# Patient Record
Sex: Female | Born: 1945
Health system: Southern US, Community
[De-identification: ages and names within clinical notes are randomized; demographics above are authoritative.]

## PROBLEM LIST (undated history)

## (undated) DIAGNOSIS — E785 Hyperlipidemia, unspecified: Secondary | ICD-10-CM

## (undated) DIAGNOSIS — Z780 Asymptomatic menopausal state: Secondary | ICD-10-CM

## (undated) DIAGNOSIS — M1990 Unspecified osteoarthritis, unspecified site: Secondary | ICD-10-CM

## (undated) DIAGNOSIS — K589 Irritable bowel syndrome without diarrhea: Secondary | ICD-10-CM

## (undated) DIAGNOSIS — I4819 Other persistent atrial fibrillation: Secondary | ICD-10-CM

## (undated) DIAGNOSIS — R1900 Intra-abdominal and pelvic swelling, mass and lump, unspecified site: Secondary | ICD-10-CM

## (undated) DIAGNOSIS — Z87891 Personal history of nicotine dependence: Secondary | ICD-10-CM

## (undated) DIAGNOSIS — E669 Obesity, unspecified: Secondary | ICD-10-CM

## (undated) DIAGNOSIS — I10 Essential (primary) hypertension: Secondary | ICD-10-CM

## (undated) DIAGNOSIS — I4510 Unspecified right bundle-branch block: Secondary | ICD-10-CM

## (undated) HISTORY — DX: Essential (primary) hypertension: I10

## (undated) HISTORY — PX: CARPAL TUNNEL RELEASE: SHX101

## (undated) HISTORY — DX: Hyperlipidemia, unspecified: E78.5

## (undated) HISTORY — DX: Unspecified right bundle-branch block: I45.10

## (undated) HISTORY — DX: Unspecified osteoarthritis, unspecified site: M19.90

## (undated) HISTORY — DX: Intra-abdominal and pelvic swelling, mass and lump, unspecified site: R19.00

## (undated) HISTORY — DX: Asymptomatic menopausal state: Z78.0

## (undated) HISTORY — DX: Personal history of nicotine dependence: Z87.891

## (undated) HISTORY — DX: Irritable bowel syndrome, unspecified: K58.9

## (undated) HISTORY — PX: TONSILLECTOMY AND ADENOIDECTOMY: SUR1326

## (undated) HISTORY — DX: Other persistent atrial fibrillation: I48.19

## (undated) HISTORY — DX: Obesity, unspecified: E66.9

## (undated) HISTORY — PX: BREAST BIOPSY: SHX20

## (undated) HISTORY — PX: BREAST REDUCTION SURGERY: SHX8

---

## 1976-12-16 HISTORY — PX: OVARIAN CYST REMOVAL: SHX89

## 1998-04-27 ENCOUNTER — Other Ambulatory Visit: Admission: RE | Admit: 1998-04-27 | Discharge: 1998-04-27 | Payer: Self-pay | Admitting: Gastroenterology

## 1998-10-25 ENCOUNTER — Other Ambulatory Visit: Admission: RE | Admit: 1998-10-25 | Discharge: 1998-10-25 | Payer: Self-pay | Admitting: Obstetrics and Gynecology

## 1999-05-23 ENCOUNTER — Ambulatory Visit (HOSPITAL_COMMUNITY): Admission: RE | Admit: 1999-05-23 | Discharge: 1999-05-23 | Payer: Self-pay | Admitting: Gastroenterology

## 2000-01-11 ENCOUNTER — Encounter: Admission: RE | Admit: 2000-01-11 | Discharge: 2000-01-11 | Payer: Self-pay | Admitting: *Deleted

## 2000-01-11 ENCOUNTER — Encounter: Payer: Self-pay | Admitting: *Deleted

## 2000-01-16 ENCOUNTER — Ambulatory Visit (HOSPITAL_COMMUNITY): Admission: RE | Admit: 2000-01-16 | Discharge: 2000-01-16 | Payer: Self-pay | Admitting: Obstetrics and Gynecology

## 2000-01-16 ENCOUNTER — Encounter: Payer: Self-pay | Admitting: Obstetrics and Gynecology

## 2000-12-26 ENCOUNTER — Encounter: Payer: Self-pay | Admitting: Obstetrics and Gynecology

## 2000-12-26 ENCOUNTER — Encounter: Admission: RE | Admit: 2000-12-26 | Discharge: 2000-12-26 | Payer: Self-pay | Admitting: Obstetrics and Gynecology

## 2001-01-12 ENCOUNTER — Encounter: Payer: Self-pay | Admitting: *Deleted

## 2001-01-12 ENCOUNTER — Encounter: Admission: RE | Admit: 2001-01-12 | Discharge: 2001-01-12 | Payer: Self-pay | Admitting: *Deleted

## 2001-05-18 ENCOUNTER — Encounter (INDEPENDENT_AMBULATORY_CARE_PROVIDER_SITE_OTHER): Payer: Self-pay

## 2001-05-18 ENCOUNTER — Ambulatory Visit (HOSPITAL_COMMUNITY): Admission: RE | Admit: 2001-05-18 | Discharge: 2001-05-18 | Payer: Self-pay | Admitting: Gastroenterology

## 2001-07-21 ENCOUNTER — Encounter: Payer: Self-pay | Admitting: Obstetrics and Gynecology

## 2001-07-21 ENCOUNTER — Ambulatory Visit (HOSPITAL_COMMUNITY): Admission: RE | Admit: 2001-07-21 | Discharge: 2001-07-21 | Payer: Self-pay | Admitting: Obstetrics and Gynecology

## 2001-09-14 ENCOUNTER — Ambulatory Visit (HOSPITAL_COMMUNITY): Admission: RE | Admit: 2001-09-14 | Discharge: 2001-09-14 | Payer: Self-pay | Admitting: Internal Medicine

## 2002-01-11 ENCOUNTER — Encounter: Payer: Self-pay | Admitting: *Deleted

## 2002-01-11 ENCOUNTER — Encounter: Admission: RE | Admit: 2002-01-11 | Discharge: 2002-01-11 | Payer: Self-pay | Admitting: *Deleted

## 2002-08-27 HISTORY — PX: CARDIOVASCULAR STRESS TEST: SHX262

## 2003-01-11 ENCOUNTER — Encounter: Admission: RE | Admit: 2003-01-11 | Discharge: 2003-01-11 | Payer: Self-pay | Admitting: *Deleted

## 2003-01-11 ENCOUNTER — Encounter: Payer: Self-pay | Admitting: *Deleted

## 2003-07-06 ENCOUNTER — Ambulatory Visit (HOSPITAL_BASED_OUTPATIENT_CLINIC_OR_DEPARTMENT_OTHER): Admission: RE | Admit: 2003-07-06 | Discharge: 2003-07-06 | Payer: Self-pay | Admitting: *Deleted

## 2004-01-16 ENCOUNTER — Encounter: Admission: RE | Admit: 2004-01-16 | Discharge: 2004-01-16 | Payer: Self-pay | Admitting: Surgery

## 2004-06-25 ENCOUNTER — Encounter: Admission: RE | Admit: 2004-06-25 | Discharge: 2004-06-25 | Payer: Self-pay | Admitting: Internal Medicine

## 2004-11-21 ENCOUNTER — Encounter: Admission: RE | Admit: 2004-11-21 | Discharge: 2004-11-21 | Payer: Self-pay | Admitting: Surgery

## 2005-05-07 ENCOUNTER — Ambulatory Visit (HOSPITAL_COMMUNITY): Admission: RE | Admit: 2005-05-07 | Discharge: 2005-05-07 | Payer: Self-pay | Admitting: Obstetrics and Gynecology

## 2005-07-23 ENCOUNTER — Encounter: Admission: RE | Admit: 2005-07-23 | Discharge: 2005-07-23 | Payer: Self-pay | Admitting: Otolaryngology

## 2005-08-20 ENCOUNTER — Encounter: Admission: RE | Admit: 2005-08-20 | Discharge: 2005-08-20 | Payer: Self-pay | Admitting: Surgery

## 2005-09-06 ENCOUNTER — Encounter: Admission: RE | Admit: 2005-09-06 | Discharge: 2005-09-06 | Payer: Self-pay | Admitting: Surgery

## 2005-11-16 ENCOUNTER — Encounter: Admission: RE | Admit: 2005-11-16 | Discharge: 2005-11-16 | Payer: Self-pay | Admitting: Internal Medicine

## 2006-06-27 ENCOUNTER — Encounter (INDEPENDENT_AMBULATORY_CARE_PROVIDER_SITE_OTHER): Payer: Self-pay | Admitting: *Deleted

## 2006-06-27 ENCOUNTER — Ambulatory Visit (HOSPITAL_BASED_OUTPATIENT_CLINIC_OR_DEPARTMENT_OTHER): Admission: RE | Admit: 2006-06-27 | Discharge: 2006-06-27 | Payer: Self-pay | Admitting: Surgery

## 2006-09-10 ENCOUNTER — Encounter: Admission: RE | Admit: 2006-09-10 | Discharge: 2006-09-10 | Payer: Self-pay | Admitting: Surgery

## 2006-12-23 HISTORY — PX: US ECHOCARDIOGRAPHY: HXRAD669

## 2007-04-27 ENCOUNTER — Encounter: Admission: RE | Admit: 2007-04-27 | Discharge: 2007-04-27 | Payer: Self-pay | Admitting: Internal Medicine

## 2007-09-14 ENCOUNTER — Encounter: Admission: RE | Admit: 2007-09-14 | Discharge: 2007-09-14 | Payer: Self-pay | Admitting: Surgery

## 2008-01-26 ENCOUNTER — Ambulatory Visit (HOSPITAL_BASED_OUTPATIENT_CLINIC_OR_DEPARTMENT_OTHER): Admission: RE | Admit: 2008-01-26 | Discharge: 2008-01-26 | Payer: Self-pay | Admitting: Urology

## 2008-09-20 ENCOUNTER — Encounter: Admission: RE | Admit: 2008-09-20 | Discharge: 2008-09-20 | Payer: Self-pay | Admitting: Surgery

## 2009-09-25 ENCOUNTER — Encounter: Admission: RE | Admit: 2009-09-25 | Discharge: 2009-09-25 | Payer: Self-pay | Admitting: Surgery

## 2010-09-25 ENCOUNTER — Encounter: Admission: RE | Admit: 2010-09-25 | Discharge: 2010-09-25 | Payer: Self-pay | Admitting: Surgery

## 2010-10-24 ENCOUNTER — Ambulatory Visit: Payer: Self-pay | Admitting: Cardiovascular Disease

## 2011-01-29 ENCOUNTER — Ambulatory Visit (INDEPENDENT_AMBULATORY_CARE_PROVIDER_SITE_OTHER): Payer: Medicare Other | Admitting: *Deleted

## 2011-01-29 DIAGNOSIS — R002 Palpitations: Secondary | ICD-10-CM

## 2011-01-29 DIAGNOSIS — I10 Essential (primary) hypertension: Secondary | ICD-10-CM

## 2011-02-13 ENCOUNTER — Ambulatory Visit: Payer: No Typology Code available for payment source | Admitting: *Deleted

## 2011-02-15 ENCOUNTER — Inpatient Hospital Stay (HOSPITAL_COMMUNITY)
Admission: EM | Admit: 2011-02-15 | Discharge: 2011-02-18 | DRG: 281 | Disposition: A | Discharge status: Home / Self Care | Payer: Medicare Other | Attending: Cardiovascular Disease | Admitting: Cardiovascular Disease

## 2011-02-15 ENCOUNTER — Emergency Department (HOSPITAL_COMMUNITY): Payer: Medicare Other

## 2011-02-15 ENCOUNTER — Ambulatory Visit: Payer: No Typology Code available for payment source | Admitting: Nurse Practitioner

## 2011-02-15 DIAGNOSIS — I4510 Unspecified right bundle-branch block: Secondary | ICD-10-CM | POA: Diagnosis present

## 2011-02-15 DIAGNOSIS — E876 Hypokalemia: Secondary | ICD-10-CM | POA: Diagnosis present

## 2011-02-15 DIAGNOSIS — E871 Hypo-osmolality and hyponatremia: Secondary | ICD-10-CM | POA: Diagnosis present

## 2011-02-15 DIAGNOSIS — R079 Chest pain, unspecified: Secondary | ICD-10-CM

## 2011-02-15 DIAGNOSIS — K219 Gastro-esophageal reflux disease without esophagitis: Secondary | ICD-10-CM | POA: Diagnosis present

## 2011-02-15 DIAGNOSIS — I10 Essential (primary) hypertension: Secondary | ICD-10-CM | POA: Diagnosis present

## 2011-02-15 DIAGNOSIS — I214 Non-ST elevation (NSTEMI) myocardial infarction: Principal | ICD-10-CM | POA: Diagnosis present

## 2011-02-15 LAB — RAPID URINE DRUG SCREEN, HOSP PERFORMED
Amphetamines: NOT DETECTED
Barbiturates: NOT DETECTED
Benzodiazepines: NOT DETECTED
Opiates: NOT DETECTED
Tetrahydrocannabinol: NOT DETECTED

## 2011-02-15 LAB — CBC
HCT: 38.9 % (ref 36.0–46.0)
Hemoglobin: 13.8 g/dL (ref 12.0–15.0)
MCHC: 35.5 g/dL (ref 30.0–36.0)
MCV: 94.4 fL (ref 78.0–100.0)
Platelets: 287 10*3/uL (ref 150–400)
RBC: 4.12 MIL/uL (ref 3.87–5.11)
RDW: 12.8 % (ref 11.5–15.5)
WBC: 7.1 10*3/uL (ref 4.0–10.5)

## 2011-02-15 LAB — DIFFERENTIAL
Basophils Absolute: 0 10*3/uL (ref 0.0–0.1)
Basophils Relative: 0 % (ref 0–1)
Eosinophils Relative: 1 % (ref 0–5)
Lymphocytes Relative: 10 % — ABNORMAL LOW (ref 12–46)
Monocytes Absolute: 0.5 10*3/uL (ref 0.1–1.0)
Monocytes Relative: 7 % (ref 3–12)
Neutro Abs: 5.8 10*3/uL (ref 1.7–7.7)
Neutrophils Relative %: 81 % — ABNORMAL HIGH (ref 43–77)

## 2011-02-15 LAB — BASIC METABOLIC PANEL
BUN: 12 mg/dL (ref 6–23)
CO2: 26 mEq/L (ref 19–32)
Calcium: 9.7 mg/dL (ref 8.4–10.5)
Creatinine, Ser: 0.65 mg/dL (ref 0.4–1.2)
GFR calc Af Amer: >60 mL/min (ref >60)
GFR calc non Af Amer: >60 mL/min (ref >60)
Glucose, Bld: 134 mg/dL — ABNORMAL HIGH (ref 70–99)
Potassium: 3.9 mEq/L (ref 3.5–5.1)
Sodium: 131 mEq/L — ABNORMAL LOW (ref 135–145)

## 2011-02-15 LAB — COMPREHENSIVE METABOLIC PANEL
ALT: 20 U/L (ref 0–35)
Albumin: 4.4 g/dL (ref 3.5–5.2)
BUN: 11 mg/dL (ref 6–23)
CO2: 25 mEq/L (ref 19–32)
Calcium: 9.6 mg/dL (ref 8.4–10.5)
Chloride: 91 mEq/L — ABNORMAL LOW (ref 96–112)
Creatinine, Ser: 0.71 mg/dL (ref 0.4–1.2)
GFR calc Af Amer: >60 mL/min (ref >60)
GFR calc non Af Amer: >60 mL/min (ref >60)
Glucose, Bld: 133 mg/dL — ABNORMAL HIGH (ref 70–99)
Potassium: 3.6 mEq/L (ref 3.5–5.1)
Total Bilirubin: 0.5 mg/dL (ref 0.3–1.2)
Total Protein: 7.5 g/dL (ref 6.0–8.3)

## 2011-02-15 LAB — CK TOTAL AND CKMB (NOT AT ARMC)
Relative Index: 3 — ABNORMAL HIGH (ref 0.0–2.5)
Total CK: 108 U/L (ref 7–177)

## 2011-02-15 LAB — POCT CARDIAC MARKERS
CKMB, poc: <1 ng/mL — ABNORMAL LOW (ref 1.0–8.0)
Myoglobin, poc: 54 ng/mL (ref 12–200)
Troponin i, poc: <0.05 ng/mL (ref 0.00–0.09)

## 2011-02-15 LAB — TROPONIN I: Troponin I: 0.15 ng/mL — ABNORMAL HIGH (ref 0.00–0.06)

## 2011-02-16 DIAGNOSIS — I517 Cardiomegaly: Secondary | ICD-10-CM

## 2011-02-16 LAB — BASIC METABOLIC PANEL
BUN: 12 mg/dL (ref 6–23)
Calcium: 9.3 mg/dL (ref 8.4–10.5)
Creatinine, Ser: 0.65 mg/dL (ref 0.4–1.2)
GFR calc non Af Amer: >60 mL/min (ref >60)
Glucose, Bld: 119 mg/dL — ABNORMAL HIGH (ref 70–99)
Potassium: 3.3 mEq/L — ABNORMAL LOW (ref 3.5–5.1)

## 2011-02-16 LAB — CARDIAC PANEL(CRET KIN+CKTOT+MB+TROPI)
CK, MB: 2.5 ng/mL (ref 0.3–4.0)
CK, MB: 2.3 ng/mL (ref 0.3–4.0)
Relative Index: INVALID (ref 0.0–2.5)
Total CK: 50 U/L (ref 7–177)
Troponin I: 0.25 ng/mL — ABNORMAL HIGH (ref 0.00–0.06)

## 2011-02-16 LAB — CBC
HCT: 35.5 % — ABNORMAL LOW (ref 36.0–46.0)
MCH: 33.2 pg (ref 26.0–34.0)
MCHC: 35.5 g/dL (ref 30.0–36.0)
MCV: 93.7 fL (ref 78.0–100.0)
Platelets: 249 10*3/uL (ref 150–400)
RDW: 12.8 % (ref 11.5–15.5)
WBC: 6.1 10*3/uL (ref 4.0–10.5)

## 2011-02-16 NOTE — H&P (Signed)
Amber, [name]              ACCOUNT NO.:  [id number]  MEDICAL RECORD NO.:  [id number]           PATIENT TYPE:  I  LOCATION:  2023                         FACILITY:  [facility]  PHYSICIAN:  Zacarias Pontes, MD       DATE OF BIRTH:  [birth year]  DATE OF ADMISSION:  02/15/2011 DATE OF DISCHARGE:                             HISTORY & PHYSICAL   LOCATION:  Room 2023.  PRIMARY CARDIOLOGIST:  Vesta Mixer, MD  PRIMARY CARE PHYSICIAN:  Gwen Pounds, MD  CHIEF COMPLAINT:  Chest pain.  HISTORY OF PRESENT ILLNESS:  [name] is a very pleasant 65 year old woman with a history of hypertension, hyperlipidemia, gastroesophageal reflux disease, and paroxysms of tachycardia managed medically who today experienced a pronounced episode of palpitations accompanied by diaphoresis lasting 20-30 minutes.  She was playing cards when it occurred.  She describes it as her hear beating rapidly and "doing somersaults."   In response to her symptoms, she took a combined total of three 10 mg doses of propranolol with eventual resolution of her symptoms.  Although her 30-minute episode earlier in the day was accompanied by diaphoresis, it was not accompanied by nausea or vomiting.  It was also unaccompanied by radiation to the jaw, back, or arms.  It is worth noting that she had no preceding symptoms prior to the abrupt onset of her symptoms.  She has been feeling well over the last few days and exercised routinely this morning without any significant difficulty.  Based on the dramatic nature and duration of her palpitations today, she presents for further evaluation and management.  PAST MEDICAL HISTORY: 1. Hypertension. 2. Hyperlipidemia. 3. Irregular heartbeat for which she was on Toprol-XL and p.r.n.     propranolol. 4. History of tonsillectomy. 5. History of breast reduction bilaterally. 6. History of carpal tunnel release.  SOCIAL HISTORY:  She drinks occasional alcohol.  Lives with her  husband. Denies any tobacco or illicit use.  She used to smoke socially when she was rather young.  She is an anxious airline passenger and typically premedicates with 0.5 mg of alprazolam.  FAMILY HISTORY:  Family history is not felt to be contributory.  REVIEW OF SYSTEMS:  As per HPI, otherwise is comprehensively negative.  ALLERGIES:  CIPROFLOXACIN.  MEDICATIONS: 1. Amlodipine 2.5 mg daily. 2. Losartan 100 mg daily. 3. HCTZ 25 mg daily. 4. Metoprolol XL 100 mg in the morning, 50 mg in the evening. 5. Rosuvastatin 5 mg daily. 6. Propranolol 10 mg to use on a p.r.n. basis. 7. Aspirin 81 mg daily. 8. Vitamin D3 and fish oil.  PHYSICAL EXAMINATION:  A comprehensive cardiovascular exam was performed.  The patient is afebrile with a heart rate of approximately 70 with blood pressure of 161/90 in the emergency room.  She is breathing comfortably with respiratory rate of 16 times per minute with oxygen saturations of 97% on 2 liters nasal cannula.  HEENT exam is notable for a neck that is supple with no masses or lymphadenopathy.  No carotid bruits are appreciated.  JVP is assessed as normal.  Cardiac exam was notable for normal S1, S2 with  no murmurs, rubs, or gallops. PMI is nondisplaced.  Pulmonary exam was notable for lungs that are clear to auscultation bilaterally with no crackles or wheezes appreciated.  Abdominal exam is notable for a soft, nontender, nondistended abdomen with positive bowel sounds and no abdominal bruits. Extremities are warm and well-perfused with no appreciable lower extremity edema.  Neurologic exam is notable for the patient who is alert and oriented x3.  No focal neurologic deficit detected at this time.  LABORATORY EVALUATION:  White blood cell count of 7000, hematocrit 38, platelets 287,000.  Sodium 128, potassium 3.6, chloride 91, bicarbonate 25 thus yielding a normal anion gap of 12.  BUN 11, creatinine 0.7. Random glucose is elevated at 133.   Initial troponin was less than 0.5, subsequently rose to 0.15 and more recently 0.25.  An ECG demonstrates normal sinus rhythm at approximately 80 beats per minute.  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 There is no ST-segment deviation.  Note the patient has neither prior caths nor prior 2-D echos in our system.  She does, however, report a negative stress test years ago. That result is not available for current review.  IMPRESSION:  This is a 65 year old woman with a symptomatic paroxysm of palpitations lasting 20-30 minutes associated with mild troponin elevation.   Her ECG does not suggest ongoing ischemia, and I suspect this was demand driven rather than an unstable coronary syndrome.  Whatever her SVT or generally symptomatic tachycardia syndrome, it represented enough of a stress to cause her to leak some enzyme when it is sustained for more than a brief period, as was the case today.  PLAN:  We will admit her to telemetry and return to cardiac markers while on intravenous heparin.  We will also continue medical management of her heart rate in the form of beta blockade both standing and p.r.n. I think a transthoracic echocardiogram would be of utility to assess wall motion and wall thickness.  We will also continue to work to control her blood pressure as this could potentially be a contributor to her propensity towards arrhythmias.  Her sodium and chloride both appear to be a bit low.  We will of course recheck these to either confirm or deny this fact.  In the mean time, I am holding her HCTZ, as it could be a culprit for her electrolyte abnormalities despite its presence on her medication list for several years now.  Ultimately the most useful intervention may prove to be a loop event recorder so that cardiology and EP can carefully assess the nature of her rhythm when she is symptomatic.  I am reluctant to conclude that her presentation  constitutes the equivalent of a positive stress test, particularly given her ability to regularly exercise vigorously without limiting symptoms. A formal stress test may be in order to better characterize  the likelihood of potentially significant coronary disease.         ______________________________ Zacarias Pontes, MD     DM/MEDQ  D:  02/16/2011  T:  02/16/2011  Job:  [id number]  Electronically Signed by Zacarias Pontes MD on 02/16/2011 08:58:10 AM

## 2011-02-17 LAB — BASIC METABOLIC PANEL
BUN: 8 mg/dL (ref 6–23)
Calcium: 9.2 mg/dL (ref 8.4–10.5)
GFR calc non Af Amer: >60 mL/min (ref >60)
Glucose, Bld: 98 mg/dL (ref 70–99)
Potassium: 4 mEq/L (ref 3.5–5.1)
Sodium: 138 mEq/L (ref 135–145)

## 2011-02-17 LAB — HEPARIN LEVEL (UNFRACTIONATED)
Heparin Unfractionated: 0.24 IU/mL — ABNORMAL LOW (ref 0.30–0.70)
Heparin Unfractionated: 0.57 IU/mL (ref 0.30–0.70)

## 2011-02-17 LAB — CBC
HCT: 38.2 % (ref 36.0–46.0)
Hemoglobin: 12.9 g/dL (ref 12.0–15.0)
MCH: 32.7 pg (ref 26.0–34.0)
MCHC: 33.8 g/dL (ref 30.0–36.0)
MCV: 96.7 fL (ref 78.0–100.0)
Platelets: 220 10*3/uL (ref 150–400)
RBC: 3.95 MIL/uL (ref 3.87–5.11)
RDW: 13.2 % (ref 11.5–15.5)
WBC: 3.9 10*3/uL — ABNORMAL LOW (ref 4.0–10.5)

## 2011-02-18 ENCOUNTER — Inpatient Hospital Stay (HOSPITAL_COMMUNITY): Payer: Medicare Other

## 2011-02-18 DIAGNOSIS — R079 Chest pain, unspecified: Secondary | ICD-10-CM

## 2011-02-18 DIAGNOSIS — I214 Non-ST elevation (NSTEMI) myocardial infarction: Secondary | ICD-10-CM

## 2011-02-18 LAB — CBC
MCH: 32.6 pg (ref 26.0–34.0)
MCV: 96.8 fL (ref 78.0–100.0)
Platelets: 228 10*3/uL (ref 150–400)
RDW: 13 % (ref 11.5–15.5)
WBC: 4.5 10*3/uL (ref 4.0–10.5)

## 2011-02-18 LAB — HEPARIN LEVEL (UNFRACTIONATED): Heparin Unfractionated: 0.27 IU/mL — ABNORMAL LOW (ref 0.30–0.70)

## 2011-02-18 MED ORDER — TECHNETIUM TC 99M TETROFOSMIN IV KIT
10.0000 | PACK | Freq: Once | INTRAVENOUS | Status: AC | PRN
  Administered 2011-02-18: 10 via INTRAVENOUS

## 2011-02-18 MED ORDER — TECHNETIUM TC 99M TETROFOSMIN IV KIT
30.0000 | PACK | Freq: Once | INTRAVENOUS | Status: AC | PRN
  Administered 2011-02-18: 30 via INTRAVENOUS

## 2011-02-19 ENCOUNTER — Ambulatory Visit: Payer: No Typology Code available for payment source | Admitting: Nurse Practitioner

## 2011-02-19 ENCOUNTER — Inpatient Hospital Stay (HOSPITAL_COMMUNITY): Payer: No Typology Code available for payment source

## 2011-02-22 ENCOUNTER — Ambulatory Visit (INDEPENDENT_AMBULATORY_CARE_PROVIDER_SITE_OTHER): Payer: Medicare Other | Admitting: Nurse Practitioner

## 2011-02-22 DIAGNOSIS — R002 Palpitations: Secondary | ICD-10-CM

## 2011-02-22 DIAGNOSIS — R079 Chest pain, unspecified: Secondary | ICD-10-CM

## 2011-02-22 DIAGNOSIS — I10 Essential (primary) hypertension: Secondary | ICD-10-CM

## 2011-02-22 DIAGNOSIS — R0602 Shortness of breath: Secondary | ICD-10-CM

## 2011-02-22 NOTE — Discharge Summary (Signed)
[name], [name]              ACCOUNT NO.:  [id number]  MEDICAL RECORD NO.:  [id number]           PATIENT TYPE:  I  LOCATION:  2023                         FACILITY:  [facility]  PHYSICIAN:  Pricilla Riffle, MD, FACCDATE OF BIRTH:  12-Feb-1946  DATE OF ADMISSION:  02/15/2011 DATE OF DISCHARGE:  02/18/2011                              DISCHARGE SUMMARY   PRIMARY CARDIOLOGIST:  Vesta Mixer, MD  PRIMARY CARE PHYSICIAN:  Amber Pounds, MD  DISCHARGE DIAGNOSES: 1. Type 2 non-ST segment elevation myocardial infarction.     a.     Peak troponin 0.25 on second full set of enzymes,      downtrending on third set.     b.     Complete negative treadmill Myoview (no ischemia, LVEF 73%). 2. Symptomatic palpitations (no ominous etiology on telemetry).     a.     Follow up with primary cardiologist and EP, likely loop      recorder.     b.     We will continue scheduled and p.r.n. beta-blockade therapy      as per preadmission med list. 3. Hyponatremia.     a.     Sodium nadir of 128, resolved by the date of discharge, HCTZ      discontinued. 4. Hypokalemia.     a.     Potassium nadir of 3.3, resolved by the date of discharge. 5. Hypertension.     a.     Systolic blood pressure ranging from 93-150, off HCTZ,      Norvasc increased to 5 mg p.o. daily.  SECONDARY DIAGNOSES: 1. Hyperlipidemia. 2. S/P tonsillectomy. 3. S/P breast reduction bilaterally. 4. S/P carpal tunnel release. 5. Gastroesophageal reflux disease.  ALLERGIES:  CIPROFLOXACIN (rash).  PROCEDURES: 1. EKG, February 16, 2011:  NSR, 80 bpm, incomplete right bundle-branch     block (old), no significant ST-T-wave changes. 2. Chest x-ray, February 15, 2011:  No acute abnormalities. 3. A 2D echocardiogram, February 16, 2011:  LV cavity size normal, mild     concentric hypertrophy, LVEF 60-65% with normal wall motion.  Left     atrium mildly dilated.  PA peak pressure of 37 mmHg. 4. Treadmill Myoview, February 18, 2011:  Clinically and  electrically     negative for ischemia.  Myoview with normal perfusion.  LVEF on     gating calculated at 73%.  HISTORY OF PRESENT ILLNESS:  [name] is a 64 year old Caucasian female with the above-noted complex medical history who presented to [facility] ED after acute onset of palpitations with diaphoresis while she was playing cards on the late evening of February 15, 2011.  The patient reports being in her usual state of health until she had an especially symptomatic episode of palpitations which she described as feeling like her heart was doing "somersaulting."  She then became diaphoretic but denies nausea, vomiting, or presyncope.  She did have some chest discomfort without radiation but this was preceded by her palpitations.  The patient reports exercising routinely without symptoms recently and a long history also of activity without symptoms.  The patient was noted to  have mildly elevated cardiac enzymes but EKG showed no acute changes.  HOSPITAL COURSE:  The patient was admitted and cardiac enzymes were cycled.  Peak troponin was noted on the second full set of enzymes at 0.25 and downtrended back to 0.15 on third set.  The patient had no further chest discomfort while in the hospital but did have significant tachy palpitations once she came off her beta-blockade therapy for risk stratification with treadmill Myoview on the morning of February 18, 2011. From the time of her admission to the time of her results being available on the treadmill Myoview, the patient was not noted to have any abnormal rhythm (only NSR) on telemetry.  The patient was noted to have abnormal electrolytes with initially low sodium and also mildly low potassium that was blamed on her HCTZ which was discontinued and will not be reinitiated at the time of her discharge.  The patient had some mild blood pressure elevation in the setting of removing HCTZ.  Her Norvasc will be increased from 2.5 to 5 mg p.o.  daily.  As there was no significant abnormalities on her 2D echocardiogram and her treadmill Myoview was negative, she was deemed stable for discharge on the early evening of February 18, 2011.  The patient has discussed possible loop recorder with some other rounding cardiologist and is interested in further discussion.  For which, she will be set up with an Electrophysiology appointment at [facility] in the next 3-4 weeks.  Prior to this, she will have the opportunity to discuss with her primary cardiologist to make a final decision in the next 1-2 weeks.  At the time of her discharge, the patient received her new medication list, prescription for increased dose of Norvasc, followup appointments/instructions, and all questions and concerns were addressed prior to leaving the hospital.  DISCHARGE LABS:  WBC is 4.5, HGB 12.4, HCT 36.8, PLT count is 228.  WBC differential notable for neutrophils at 81%, lymphocytes at 10%.  Sodium 138, potassium 4.0, chloride 105, bicarb 24, BUN 8, creatinine 0.64, glucose 98.  Liver function tests on admission within normal limits. Calcium 9.6, albumin 4.4, magnesium 2.1.  Point-of-care markers negative.  First full set enzymes; CK 108, MB 3.2, relative index 3.0, troponin 0.15.  Second full set of enzymes; CK 60, MB 2.5, troponin 0.25.  Third full set; CK 50, MB 2.3, troponin 0.15.  BNP 141.  Urine drug screen negative for all substances tested.  FOLLOWUP PLANS AND APPOINTMENTS:  Please see hospital course.  DISCHARGE MEDICATIONS: 1. Acetaminophen 325 mg 1-2 tabs p.o. q.4 hours p.r.n. 2. Amlodipine 5 mg p.o. at bedtime. 3. Enteric-coated aspirin 81 mg p.o. daily. 4. Crestor 5 mg p.o. daily. 5. Fish oil 1 capsule p.o. daily. 6. Losartan 100 mg 1 tablet p.o. q.a.m. 7. Nexium 40 mg 1 tablet p.o. q.a.m. p.r.n. 8. Propranolol 10 mg 1 tablet q.i.d. p.r.n. 9. Toprol-XL 100 mg 1 tablet p.o. q.a.m. and 1/2 tablet p.o. at     bedtime 10.Vitamin D 1  tablet p.o. daily.  Duration of discharging counter including physician time was 35 minutes.     Jarrett Ables, PAC   ______________________________ Pricilla Riffle, MD, [facility]    MS/MEDQ  D:  02/18/2011  T:  02/19/2011  Job:  [id number]  cc:   Amber Pounds, MD  Electronically Signed by Jarrett Ables PAC on 02/21/2011 12:56:53 PM Electronically Signed by Dietrich Pates MD [facility] on 02/21/2011 02:14:38 PM

## 2011-02-25 ENCOUNTER — Ambulatory Visit (HOSPITAL_COMMUNITY)
Admission: RE | Admit: 2011-02-25 | Discharge: 2011-02-25 | Disposition: A | Discharge status: Home / Self Care | Payer: Medicare Other | Source: Ambulatory Visit | Attending: Cardiology | Admitting: Cardiology

## 2011-02-25 DIAGNOSIS — R0602 Shortness of breath: Secondary | ICD-10-CM | POA: Insufficient documentation

## 2011-02-25 DIAGNOSIS — R079 Chest pain, unspecified: Secondary | ICD-10-CM

## 2011-02-25 DIAGNOSIS — E785 Hyperlipidemia, unspecified: Secondary | ICD-10-CM | POA: Insufficient documentation

## 2011-02-25 DIAGNOSIS — I10 Essential (primary) hypertension: Secondary | ICD-10-CM | POA: Insufficient documentation

## 2011-02-25 HISTORY — PX: CARDIAC CATHETERIZATION: SHX172

## 2011-03-07 ENCOUNTER — Encounter: Payer: Self-pay | Admitting: Cardiovascular Disease

## 2011-03-07 NOTE — Procedures (Signed)
  Amber [name], Amber [name]              ACCOUNT NO.:  [id number]  MEDICAL RECORD NO.:  [id number]           PATIENT TYPE:  O  LOCATION:  [facility]                         FACILITY:  [facility]  PHYSICIAN:  Fannie Gathright M. [place], M.D.  DATE OF BIRTH:  01-03-1946  DATE OF PROCEDURE:  02/25/2011 DATE OF DISCHARGE:  02/25/2011                           CARDIAC CATHETERIZATION   INDICATIONS FOR PROCEDURE:  A 65 year old white female with history of hypertension and hyperlipidemia who presents for evaluation of chest pain and shortness of breath.  Prior stress Myoview was negative.  She had recent hospitalization with diaphoresis and palpitations and had positive troponins.  PROCEDURE:  Left heart catheterization, coronary and left ventricular angiography.  ACCESS:  Via the right radial artery using the standard Seldinger technique.  EQUIPMENT:  A 5-French 4-cm right Judkins catheter, 5-French 3.5-cm left Judkins catheter, 5-French pigtail catheter, 5-French arterial sheath.  MEDICATIONS:  Local anesthesia 1% Xylocaine, Versed 3 mg IV, fentanyl 75 mcg IV, heparin 3500 units IV, and verapamil 3 mg intra-arterial.  CONTRAST:  Omnipaque 100 mL.  HEMODYNAMIC DATA:  Aortic pressure was 143/76 with mean of 107, left ventricle pressure was 143 with EDP of 21 mmHg.  ANGIOGRAPHIC DATA: 1. The left coronary artery arises and distributes normally.  The left     main coronary artery is normal. 2. The left anterior descending artery is small in caliber distally,     but there is no plaque or acute narrowing noted. 3. The left circumflex coronary artery is normal. 4. The right coronary artery is a dominant vessel and is normal. 5. Left ventricular angiography performed in the RAO view demonstrates     normal left ventricular size and contractility with normal systolic     function.  Ejection fraction was estimated at 60%.  FINAL INTERPRETATION: 1. Normal coronary anatomy. 2. Normal left ventricular  function.  PLAN:  Continued medical management.          ______________________________ [name] M. [place], M.D.     PMJ/MEDQ  D:  02/25/2011  T:  02/26/2011  Job:  [id number]  cc:   Gwen Pounds, MD  Electronically Signed by Birdia Jaycox [place] M.D. on 03/07/2011 03:39:02 PM

## 2011-03-13 ENCOUNTER — Encounter: Payer: Self-pay | Admitting: Nurse Practitioner

## 2011-03-13 ENCOUNTER — Ambulatory Visit (INDEPENDENT_AMBULATORY_CARE_PROVIDER_SITE_OTHER): Payer: Medicare Other | Admitting: Nurse Practitioner

## 2011-03-13 VITALS — BP 120/70 | HR 70 | Wt 169.0 lb

## 2011-03-13 DIAGNOSIS — R002 Palpitations: Secondary | ICD-10-CM

## 2011-03-13 DIAGNOSIS — R079 Chest pain, unspecified: Secondary | ICD-10-CM

## 2011-03-13 DIAGNOSIS — F439 Reaction to severe stress, unspecified: Secondary | ICD-10-CM

## 2011-03-13 DIAGNOSIS — Z733 Stress, not elsewhere classified: Secondary | ICD-10-CM

## 2011-03-13 DIAGNOSIS — F32A Depression, unspecified: Secondary | ICD-10-CM

## 2011-03-13 DIAGNOSIS — F329 Major depressive disorder, single episode, unspecified: Secondary | ICD-10-CM

## 2011-03-13 DIAGNOSIS — I10 Essential (primary) hypertension: Secondary | ICD-10-CM | POA: Insufficient documentation

## 2011-03-13 MED ORDER — SERTRALINE HCL 50 MG PO TABS: 50.0000 mg | ORAL_TABLET | Freq: Every day | ORAL | Status: DC

## 2011-03-13 NOTE — Assessment & Plan Note (Signed)
Her cath was normal.

## 2011-03-13 NOTE — Patient Instructions (Signed)
Lets start you on Zoloft 50mg  per day. May start out with just a half a tablet for a couple weeks and then increase to whole tablet if needed. Continue with your other meds. Keep wearing your monitor  See Dr. Elease Hashimoto in 6 weeks.

## 2011-03-13 NOTE — Assessment & Plan Note (Signed)
I will start her on Zoloft. She is going to try 50mg  taking just a half a tab at first for the next couple of weeks. She may try to increase to the 50mg  if needed. She will see Dr. Elease Hashimoto back for follow up in 6 weeks.

## 2011-03-13 NOTE — Progress Notes (Signed)
History of Present Illness: Amber [name] is seen back today for a post cath visit. She is seen for Dr. Elease Hashimoto. Her cath was totally normal. Her wrist is fine. She continues with palpitations. She feels like it is triggered by stress. She has "good stress and bad stress" currently. She is waking up at 2am each night and not going back to sleep. She has 2 weddings coming up. Her best friend is dying. One son is unemployed. One son has chronic pain issues.   Current Outpatient Prescriptions on File Prior to Visit  Medication Sig Dispense Refill  . amLODipine (NORVASC) 5 MG tablet Take 5 mg by mouth at bedtime.        Marland Kitchen aspirin 81 MG tablet Take 81 mg by mouth daily.        . Cholecalciferol (VITAMIN D PO) Take by mouth daily.        Marland Kitchen esomeprazole (NEXIUM) 40 MG capsule Take 40 mg by mouth as needed.        . fish oil-omega-3 fatty acids 1000 MG capsule Take 2 g by mouth daily.        Marland Kitchen losartan (COZAAR) 100 MG tablet Take 100 mg by mouth daily.        . metoprolol (TOPROL-XL) 100 MG 24 hr tablet Take 100 mg by mouth daily. 100 MG IN THE MORNING, 50 MG IN THE EVENING       . propranolol (INDERAL) 10 MG tablet Take 10 mg by mouth as needed.        . rosuvastatin (CRESTOR) 5 MG tablet Take 5 mg by mouth 3 (three) times a week.        . sertraline (ZOLOFT) 50 MG tablet Take 1 tablet (50 mg total) by mouth daily.  90 tablet  3    Allergies  Allergen Reactions  . Ciprofloxacin Itching and Swelling    Past Medical History  Diagnosis Date  . Chest pain     s/p cath 2012  . SOB (shortness of breath)   . HTN (hypertension)   . Palpitations   . Hyperlipemia   . Obesity   . History of tobacco abuse   . Post-menopausal   . Chest tightness   . Dizziness   . GERD (gastroesophageal reflux disease)   . Right bundle branch block     Past Surgical History  Procedure Date  . Breast reduction surgery     BILATERAL  . Carpal tunnel release   . Tonsillectomy and adenoidectomy AGE 65    History    Smoking status  . Never Smoker   Smokeless tobacco  . Not on file    History  Alcohol Use     Family History  Problem Relation Age of Onset  . Prostate cancer Father   . Heart failure Father   . Atrial fibrillation Mother   . Diabetes Father     Review of Systems: The review of systems is as above.  All other systems were reviewed and are negative.  Physical Exam: BP 120/70  Pulse 70  Wt 169 lb (76.658 kg) She is very pleasant and in no acute distress. HEENT is negative. Lungs are clear. Cardiac exam shows a regular rate and rhythm. Abdomen is soft. Extremities are without edema. Right wrist is stable. Gait and ROM are intact. She has no gross focal neurologic deficits.  Her event monitor strips are reviewed. She has had one 8 beat run of PSVT. Otherwise the tracings show sinus with artifact.  Assessment / Plan:

## 2011-03-13 NOTE — Assessment & Plan Note (Signed)
She still has her event monitor in place. She will continue to monitor. She has an appointment with EP next month. May not need to keep. Her symptoms seem triggered by stress. She is interested in going back on Zoloft and see how she does.

## 2011-04-01 ENCOUNTER — Encounter: Payer: Self-pay | Admitting: Internal Medicine

## 2011-04-01 ENCOUNTER — Ambulatory Visit (INDEPENDENT_AMBULATORY_CARE_PROVIDER_SITE_OTHER): Payer: Medicare Other | Admitting: Internal Medicine

## 2011-04-01 ENCOUNTER — Encounter: Payer: Self-pay | Admitting: *Deleted

## 2011-04-01 VITALS — BP 150/84 | HR 74 | Ht 63.0 in | Wt 166.4 lb

## 2011-04-01 DIAGNOSIS — R1900 Intra-abdominal and pelvic swelling, mass and lump, unspecified site: Secondary | ICD-10-CM

## 2011-04-01 DIAGNOSIS — I4510 Unspecified right bundle-branch block: Secondary | ICD-10-CM | POA: Insufficient documentation

## 2011-04-01 DIAGNOSIS — R9431 Abnormal electrocardiogram [ECG] [EKG]: Secondary | ICD-10-CM | POA: Insufficient documentation

## 2011-04-01 DIAGNOSIS — R002 Palpitations: Secondary | ICD-10-CM

## 2011-04-01 LAB — BASIC METABOLIC PANEL
BUN: 9 mg/dL (ref 6–23)
Creatinine, Ser: 0.8 mg/dL (ref 0.4–1.2)
GFR: 80.93 mL/min (ref >60.00)

## 2011-04-01 LAB — CBC WITH DIFFERENTIAL/PLATELET
Eosinophils Relative: 1.3 % (ref 0.0–5.0)
Lymphocytes Relative: 14 % (ref 12.0–46.0)
Monocytes Relative: 8.8 % (ref 3.0–12.0)
Neutrophils Relative %: 75.5 % (ref 43.0–77.0)
Platelets: 279 10*3/uL (ref 150.0–400.0)
RBC: 4.03 Mil/uL (ref 3.87–5.11)
WBC: 5.4 10*3/uL (ref 4.5–10.5)

## 2011-04-01 NOTE — Assessment & Plan Note (Signed)
The patient has a series of palpitations syndromes. The first are the "blips" Suggestive of PACs or PVCs. The fact that they are associated with lightheadedness remaining think more likely that they are PVCs. 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 The third are irregularly irregular suggestive of atrial fibrillation. These can occur even with PACs or WPW. Given these significant symptoms associated with these episodes and they're in frequency the possibility of implantable loop recorder was raised and I think is a reasonable suggestion. We have reviewed the potential benefits and risks of this including but not limited to infection and the patient would like to proceed

## 2011-04-01 NOTE — Progress Notes (Signed)
HPI: Amber [name] is a 65 y.o. female  Seen at the request of Dr. Jamse Mead and [facility]-ANP for palpitations.  She has a long-standing history of abrupt onset and offset palpitations. These were first reported to her doing some OB/GYN surgery about 30 years ago. These episodes occur infrequently they last 5-10 minutes and are associated with lightheadedness/presyncope.  She also has a 8-10 year history of "blips".  These are associated with presyncope. They are very very brief. It may be related to stress.  In the 2 months she has had 2 distinct episodes. She describes these as "a flipping Elephant in her chest." They're associated with shortness of breath tremulousness lightheadedness and presyncope. She knows that these are irregularly irregular. The episodes would last 20-30 minutes. She was given a 30 day event recorder fall in the more recent episode and this was unrevealing.  Her cardiac evaluation has included an ultrasound that was normal a Myoview that was normal. However, because of cardiac enzyme leak associated with a hospitalization for the aforementioned tachycardia she was admitted for catheterization in mid March 2012 demonstrated normal left ventricular function and normal coronary arteries.     Current Outpatient Prescriptions  Medication Sig Dispense Refill  . amLODipine (NORVASC) 5 MG tablet Take 5 mg by mouth at bedtime.        Marland Kitchen aspirin 81 MG tablet Take 81 mg by mouth daily.        . Cholecalciferol (VITAMIN D PO) Take by mouth daily.        Marland Kitchen esomeprazole (NEXIUM) 40 MG capsule Take 40 mg by mouth as needed.        . fish oil-omega-3 fatty acids 1000 MG capsule Take 2 g by mouth daily.        Marland Kitchen losartan (COZAAR) 100 MG tablet Take 100 mg by mouth daily.        . metoprolol (TOPROL-XL) 100 MG 24 hr tablet Take 100 mg by mouth daily. 100 MG IN THE MORNING, 50 MG IN THE EVENING       . propranolol (INDERAL) 10 MG tablet Take 10 mg by mouth as needed.        . rosuvastatin (CRESTOR)  5 MG tablet Take 5 mg by mouth 3 (three) times a week.        . sertraline (ZOLOFT) 50 MG tablet Take 1 tablet (50 mg total) by mouth daily.  90 tablet  3    Allergies  Allergen Reactions  . Ciprofloxacin Itching and Swelling    Past Medical History  Diagnosis Date  . Chest pain     s/p cath 2012  . SOB (shortness of breath)   . HTN (hypertension)   . Palpitations   . Hyperlipemia   . Obesity   . History of tobacco abuse   . Post-menopausal   . Chest tightness   . Dizziness   . GERD (gastroesophageal reflux disease)   . Right bundle branch block     Past Surgical History  Procedure Date  . Breast reduction surgery     BILATERAL  . Carpal tunnel release   . Tonsillectomy and adenoidectomy AGE 7    Family History  Problem Relation Age of Onset  . Prostate cancer Father   . Heart failure Father   . Atrial fibrillation Mother   . Diabetes Father     History   Social History  . Marital Status: Married    Spouse Name: N/A    Number of Children:  N/A  . Years of Education: N/A   Occupational History  . Not on file.   Social History Main Topics  . Smoking status: Never Smoker   . Smokeless tobacco: Never Used  . Alcohol Use: Yes     Social alcohol  . Drug Use: No  . Sexually Active:    Other Topics Concern  . Not on file   Social History Narrative  . No narrative on file    Fourteen point review of systems was negative except as noted in HPI and PMH   PHYSICAL EXAMINATION  Blood pressure 150/84, pulse 74, height 5\' 3"  (1.6 m), weight 166 lb 6.4 oz (75.479 kg).   Well developed and nourished Caucasian female appearing younger than her stated age in no acute distress HENT normal Neck supple with JVP-flat Carotids brisk and full without bruits Back without scoliosis or kyphosis Clear Regular rate and rhythm, no murmurs or gallops Abd-soft with active BS without hepatomegaly; broad abdominal pulsation Femoral pulses 2+ distal pulses intact No  Clubbing cyanosis edema Skin-warm and dry LN-neg submandibular and supraclavicular A & Oriented CN 3-12 normal  Grossly normal sensory and motor function Affect engaging . Today's date  sinus rhythm at 74 Intervals 0.13/0.09/23 7 Axis is 9.  ECGs from March 2012 were reviewed there were 3 of them. There is suggestion of a delta wave on a number of them with the same early transition suggestive of WPW. On one of the tracings there is PR interval of 160 ms which is isoelectric in the inferior leads but not in lead V4.

## 2011-04-01 NOTE — Patient Instructions (Addendum)
Your physician recommends that you schedule a follow-up appointment in: HOSPITAL WILL SCHEDULE WOUND CHECK AFTER LOOP IMPLANT Your physician has requested that you have an abdominal aorta duplex. During this test, an ultrasound is used to evaluate the aorta. Allow 30 minutes for this exam. Do not eat after midnight the day before and avoid carbonated beverages

## 2011-04-01 NOTE — Assessment & Plan Note (Signed)
The patient has a broad pulsating mass in her abdomen. We'll undertake a ultrasound

## 2011-04-01 NOTE — Assessment & Plan Note (Signed)
The possibility of WPW cannot be excluded. The implications as relates to atrial fibrillation are significant.

## 2011-04-03 ENCOUNTER — Other Ambulatory Visit: Payer: Self-pay | Admitting: *Deleted

## 2011-04-03 DIAGNOSIS — I10 Essential (primary) hypertension: Secondary | ICD-10-CM

## 2011-04-03 MED ORDER — AMLODIPINE BESYLATE 5 MG PO TABS: 5.0000 mg | ORAL_TABLET | Freq: Every day | ORAL | Status: DC

## 2011-04-03 NOTE — Telephone Encounter (Signed)
Patient request refill. Completed, Jodette Shristi Scheib RN  

## 2011-04-04 ENCOUNTER — Other Ambulatory Visit: Payer: Self-pay | Admitting: Cardiology

## 2011-04-04 ENCOUNTER — Ambulatory Visit (INDEPENDENT_AMBULATORY_CARE_PROVIDER_SITE_OTHER): Payer: Medicare Other | Admitting: Cardiology

## 2011-04-04 DIAGNOSIS — R109 Unspecified abdominal pain: Secondary | ICD-10-CM

## 2011-04-04 DIAGNOSIS — R0989 Other specified symptoms and signs involving the circulatory and respiratory systems: Secondary | ICD-10-CM

## 2011-04-04 DIAGNOSIS — R1900 Intra-abdominal and pelvic swelling, mass and lump, unspecified site: Secondary | ICD-10-CM

## 2011-04-05 ENCOUNTER — Encounter: Payer: Medicare Other | Admitting: Cardiology

## 2011-04-08 ENCOUNTER — Encounter: Payer: Self-pay | Admitting: Internal Medicine

## 2011-04-08 ENCOUNTER — Ambulatory Visit (HOSPITAL_COMMUNITY)
Admission: RE | Admit: 2011-04-08 | Discharge: 2011-04-08 | Disposition: A | Discharge status: Home / Self Care | Payer: Medicare Other | Source: Ambulatory Visit | Attending: Internal Medicine | Admitting: Internal Medicine

## 2011-04-08 DIAGNOSIS — R002 Palpitations: Secondary | ICD-10-CM

## 2011-04-08 LAB — SURGICAL PCR SCREEN: MRSA, PCR: NEGATIVE

## 2011-04-10 NOTE — Consult Note (Signed)
  NAMECAYLOR, CERINO              ACCOUNT NO.:  [id number]  MEDICAL RECORD NO.:  [id number]           PATIENT TYPE:  O  LOCATION:  [facility]                         FACILITY:  [facility]  PHYSICIAN:  Duke Salvia, MD, FACCDATE OF BIRTH:  February 15, 1946  DATE OF CONSULTATION:  04/08/2011 DATE OF DISCHARGE:  04/08/2011                                CONSULTATION   PREOPERATIVE DIAGNOSIS:  Recurrent palpitations, relatively infrequent.  POSTOPERATIVE DIAGNOSIS:  Recurrent palpitations, relatively infrequent.  PROCEDURE:  Implantable loop recorder.  Following obtaining informed consent, the patient was brought to electrophysiology laboratory and placed on the fluoroscopic table in the supine position.  After routine prep and drape, lidocaine was infiltrated about 2 cm caudal to the clavicle and lateral to the sternum and carried down to the layer of the prepectoral fascia using electrocautery and sharp dissection.  A pocket was formed similarly. Hemostasis was obtained.  We then put 220 sutures at the cephalad aspect of the pocket and used it to secure a model DM2100 loop recorder, serial number [id number].  This was secured to the prepectoral fascia.  The pocket was copiously irrigated with antibiotic-containing saline solution and the wound was closed in 3 layers in normal fashion.  The wound was washed, dried, and Benzoin and Steri-Strips dressing were applied. Needle counts, sponge counts and instrument counts were correct at the end of the procedure according to the staff.  The patient tolerated the procedure without apparent complication.     Duke Salvia, MD, [facility]     SCK/MEDQ  D:  04/08/2011  T:  04/09/2011  Job:  [id number]  Electronically Signed by Sherryl Manges MD [facility] on 04/10/2011 08:51:06 AM

## 2011-04-12 ENCOUNTER — Telehealth: Payer: Self-pay | Admitting: Internal Medicine

## 2011-04-12 NOTE — Telephone Encounter (Signed)
Left message for patient ILR instructions for recordings and follow up appt. 5/7.

## 2011-04-12 NOTE — Telephone Encounter (Signed)
Pt was implanted with loop recorder and was not told what to do after reading, where to send or who to call

## 2011-04-12 NOTE — Telephone Encounter (Signed)
Pt calling concerning loop recorder--will route message to paula and kristen--nt

## 2011-04-22 ENCOUNTER — Ambulatory Visit (INDEPENDENT_AMBULATORY_CARE_PROVIDER_SITE_OTHER): Payer: Medicare Other | Admitting: *Deleted

## 2011-04-22 DIAGNOSIS — R002 Palpitations: Secondary | ICD-10-CM

## 2011-04-24 ENCOUNTER — Encounter: Payer: Self-pay | Admitting: Cardiovascular Disease

## 2011-04-24 ENCOUNTER — Ambulatory Visit (INDEPENDENT_AMBULATORY_CARE_PROVIDER_SITE_OTHER): Payer: Medicare Other | Admitting: Cardiovascular Disease

## 2011-04-24 VITALS — BP 130/80 | HR 70 | Wt 163.0 lb

## 2011-04-24 DIAGNOSIS — R002 Palpitations: Secondary | ICD-10-CM

## 2011-04-24 DIAGNOSIS — R079 Chest pain, unspecified: Secondary | ICD-10-CM

## 2011-04-24 NOTE — Assessment & Plan Note (Signed)
She's not had any significant palpitations recently. She now has a loop recorder. She's been followed by Dr. Graciela Husbands for these palpitations. 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 We will continue with the Toprol and she can also take propranolol on an as-needed basis. I've asked her to call me if she has significant episodes of palpitations.

## 2011-04-24 NOTE — Progress Notes (Signed)
[name] Date of Birth  [birth year] [facility] [address]     [address] [place], [place]  [address] [phone]  Fax  [phone]  History of Present Illness:  Amber [name] presents today for followup of her chest pains and tachycardia palpitations. She presented a month ago with some chest pains. She was hospitalized and her cardiac enzymes were minimally elevated. A subsequent heart catheterization was normal.  She has continued have episodes of tachycardia. She's had a Amber [name] [employer] monitor that has revealed episodes of supraventricular tachycardia. Dr. Graciela Husbands saw her and thought that there was some question of WPW. He is placed a loop recorder in her.    She has felt fairly well over the past several months.  Current Outpatient Prescriptions on File Prior to Visit  Medication Sig Dispense Refill  . amLODipine (NORVASC) 5 MG tablet Take 1 tablet (5 mg total) by mouth at bedtime.  90 tablet  3  . aspirin 81 MG tablet Take 81 mg by mouth daily.        . Cholecalciferol (VITAMIN D PO) Take by mouth daily.        Marland Kitchen esomeprazole (NEXIUM) 40 MG capsule Take 40 mg by mouth as needed.        . fish oil-omega-3 fatty acids 1000 MG capsule Take 2 g by mouth daily.        Marland Kitchen losartan (COZAAR) 100 MG tablet Take 100 mg by mouth daily.        . metoprolol (TOPROL-XL) 100 MG 24 hr tablet Take 100 mg by mouth daily. 100 MG IN THE MORNING, 50 MG IN THE EVENING       . propranolol (INDERAL) 10 MG tablet Take 10 mg by mouth as needed.        . rosuvastatin (CRESTOR) 5 MG tablet Take 5 mg by mouth 3 (three) times a week.        . sertraline (ZOLOFT) 50 MG tablet Take 25 mg by mouth daily.        Marland Kitchen DISCONTD: sertraline (ZOLOFT) 50 MG tablet Take 1 tablet (50 mg total) by mouth daily.  90 tablet  3    Allergies  Allergen Reactions  . Ciprofloxacin Itching and Swelling    Past Medical History  Diagnosis Date  . Chest pain     + Tn s/p cath  2012--Normal  . SOB (shortness of breath)   . HTN (hypertension)   . Palpitations     three types  . 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 Obesity   . History of tobacco abuse   . Post-menopausal   . Chest tightness   . Dizziness   . GERD (gastroesophageal reflux disease)   . Right bundle branch block   . Hyperlipemia   . Abdominal mass     Pulsating    Past Surgical History  Procedure Date  . Breast reduction surgery     BILATERAL  . Carpal tunnel release   . Tonsillectomy and adenoidectomy AGE 63    History  Smoking status  . Never Smoker   Smokeless tobacco  . Never Used    History  Alcohol Use  . Yes    Social alcohol    Family History  Problem Relation Age of Onset  . Prostate cancer Father   . Heart failure Father   . Atrial fibrillation Mother   . Diabetes Father     Reviw of  Systems:  Reviewed in the HPI.  All other systems are negative.  Physical Exam: BP 130/80  Pulse 70  Wt 163 lb (73.936 kg) The patient is alert and oriented x 3.  The mood and affect are normal.  The skin is warm and dry.  Color is normal.  The HEENT exam reveals that the sclera are nonicteric.  The mucous membranes are moist.  The carotids are 2+ without bruits.  There is no thyromegaly.  There is no JVD.  The lungs are clear.  The chest wall is non tender. Her loop recorder insertion site is clean and dry. The heart exam reveals a regular rate with a normal S1 and S2.  There are no murmurs, gallops, or rubs.  The PMI is not displaced.   Abdominal exam reveals good bowel sounds.  There is no guarding or rebound.  There is no hepatosplenomegaly or tenderness.  And was able to palpate her aorta.  Exam of the legs reveal no clubbing, cyanosis, or edema.  The legs are without rashes.  The distal pulses are intact.  Cranial nerves II - XII are intact.  Motor and sensory functions are intact.  The gait is normal.  ECG:  Assessment / Plan:

## 2011-04-24 NOTE — Assessment & Plan Note (Signed)
She's had a normal heart catheterization.

## 2011-04-26 ENCOUNTER — Ambulatory Visit
Admission: RE | Admit: 2011-04-26 | Discharge: 2011-04-26 | Disposition: A | Discharge status: Home / Self Care | Payer: Medicare Other | Source: Ambulatory Visit | Attending: Surgery | Admitting: Surgery

## 2011-04-26 ENCOUNTER — Other Ambulatory Visit: Payer: Self-pay | Admitting: Surgery

## 2011-04-26 DIAGNOSIS — N63 Unspecified lump in unspecified breast: Secondary | ICD-10-CM

## 2011-04-30 NOTE — Op Note (Signed)
NAME:  Amber [name], Amber [name]              ACCOUNT NO.:  [id number]   MEDICAL RECORD NO.:  [id number]         PATIENT TYPE:  HAMB   LOCATION:                               FACILITY:  [facility]   PHYSICIAN:  Martina Sinner, MD DATE OF BIRTH:  1946-01-30   DATE OF PROCEDURE:  01/26/2008  DATE OF DISCHARGE:                               OPERATIVE REPORT   PREOPERATIVE DIAGNOSIS:  Pelvic pain.   POSTOPERATIVE DIAGNOSIS:  Pelvic pain, interstitial cystitis.   SURGERY:  Cystoscopy, bladder hydrodistention, bladder installation  therapy.   SURGEON:  Martina Sinner, MD   [name] has flare-ups of frequency associated with vaginal pressure.  She also dyspareunia.  In the past she has taken Urelle and/or Enablex  which have help her symptoms.  I will ask for more about this when she  returns to clinic.  She consented to a cystoscopy and hydrodistention.   The patient is prepped and draped in usual fashion.  A 21 scope was used  for examination.  The bladder mucosa and trigone were normal.  There is  no stitch, foreign body or carcinoma.  She was hydrodistended to 1000  mL.  I emptied her bladder and reexamined the bladder cystoscopically.  She had diffuse glomerulations.  She had no ulcers.  She had minimal  bleeding.  Bladder was emptied.   14-French red rubber catheter was inserted and then instilled 15 mL of  0.5% Marcaine with 400 mg of Pyridium.   I am going to treat [name] for interstitial cystitis when she  returns to clinic in a week.           ______________________________  Martina Sinner, MD  Electronically Signed     SAM/MEDQ  D:  01/26/2008  T:  01/27/2008  Job:  [id number]

## 2011-05-03 NOTE — Procedures (Signed)
[facility]  Patient:    Amber [name], Amber [name]                     MRN: [id number] Proc. Date: 05/18/01 Adm. Date:  [id number] Attending:  Rich Brave CC:         Jonelle Sports. Cheryll Cockayne, M.D.   Procedure Report  PROCEDURE:  Upper endoscopy with biopsies.  INDICATIONS FOR PROCEDURE:  Follow-up of gastric polyps in a 65 year old female on long standing p.r.n. use of Prilosec.  FINDINGS:  Minimal reflux esophagitis. Small hiatal hernia with esophageal ring. Multiple gastric polyps as previously noted.  DESCRIPTION OF PROCEDURE:  The nature, purpose and risk of the procedure were familiar to the patient who provided written consent. Sedation was fentanyl 175 mcg and Versed 16 mg IV without arrhythmias or desaturation. The amount of medication is similar to what she needed for her endoscopy two years ago.  The vocal cords are not well seen. The esophagus was essentially normal except for some tiny linear streaks of erythema above the squamocolumnar junction consistent with some mild reflux esophagitis. No erosive changes were seen and I did not see any evidence of Barretts mucosa. There was a widely patent esophageal ring and below this was a small hiatal hernia.  The stomach contained a small to moderate clear residual which was suctioned up. There were multiple gastric polyps present, the largest probably about 8-10 mm across but most of them were like 4-6 mm across. Biopsies of several of these were obtained but there were probably about 20 or 30 such polyps altogether so I did not biopsy each of them. None of them looked like a mass or any kind of worrisome lesion.  The pylorus and duodenal bulb looked normal. I was unable to get the scope to advance around the C loop.  Pullback was then performed. The scope was removed from the patient. She tolerated the procedure well and there were no apparent complications. The biopsies were obtained after  inspection of the duodenal bulb as described above, just prior to removing the scope.  IMPRESSION: 1. Multiple gastric polyps, path pending. 2. Small hiatal hernia with esophageal ring. 3. Minimal reflux esophagitis.  PLAN:  Await pathology on polyps with consideration for follow-up endoscopy in two to five years depending on the histologic findings at this time. DD:  05/18/01 TD:  05/19/01 Job: [id number]

## 2011-05-03 NOTE — Op Note (Signed)
   NAME:  Amber [name], Amber [name]                        ACCOUNT NO.:  [id number]   MEDICAL RECORD NO.:  [id number]                   PATIENT TYPE:  AMB   LOCATION:  [facility]                                  FACILITY:  [facility]   PHYSICIAN:  Lowell Bouton, M.D.      DATE OF BIRTH:  12-30-1945   DATE OF PROCEDURE:  07/06/2003  DATE OF DISCHARGE:                                 OPERATIVE REPORT   PREOPERATIVE DIAGNOSIS:  Right carpal tunnel syndrome.   POSTOPERATIVE DIAGNOSIS:  Right carpal tunnel syndrome.   PROCEDURE:  Decompression, median nerve, right carpal tunnel.   SURGEON:  Lowell Bouton, M.D.   ANESTHESIA:  0.5% Marcaine local with sedation.   OPERATIVE FINDINGS:  The patient had significant narrowing in her carpal  canal.  The nerve was quite tight and there were no masses identified.  The  motor branch of the nerve was intact.   PROCEDURE:  Under 0.5% Marcaine local anesthesia with a tourniquet on the  right arm, the right hand was prepped and draped in the usual fashion.  After exsanguinating the limb, the tourniquet was inflated to 250 mmHg.  A 3  cm longitudinal incision was made in the palm just ulnar to the thenar  crease.  Sharp dissection carried through the subcutaneous tissues and  bleeding points were coagulated.  Blunt dissection was carried through the  superficial palmar fascia distal to the transverse carpal ligament.  A  hemostat was then placed in the carpal canal up against the hook of the  hamate.  The transverse carpal ligament was divided on the ulnar border of  the median nerve sharply.  The proximal end of the ligament was divided with  the scissors after dissecting the nerve away from the undersurface of the  ligament.  The carpal canal was then palpated and was found to be adequately  decompressed.  The nerve was examined and the motor branch identified.  The  wound was then irrigated with saline.  The skin was closed with 4-0 nylon  suture.  Sterile dressings were applied, followed by a volar wrist splint.  The patient tolerated the procedure well and went to the recovery room awake  and stable, in good condition.                                               Lowell Bouton, M.D.    EMM/MEDQ  D:  07/06/2003  T:  07/07/2003  Job:  [id number]   cc:   Gwen Pounds, M.D.  [address]  [place]  [place] [address]  Fax: [phone]

## 2011-05-03 NOTE — Op Note (Signed)
NAME:  Amber [name], Amber [name]              ACCOUNT NO.:  [id number]   MEDICAL RECORD NO.:  [id number]          PATIENT TYPE:  AMB   LOCATION:  [facility]                          FACILITY:  [facility]   PHYSICIAN:  Currie Paris, M.D.DATE OF BIRTH:  October 30, 1946   DATE OF PROCEDURE:  DATE OF DISCHARGE:                                 OPERATIVE REPORT   OFFICE MEDICAL RECORD NUMBER [id number].   PREOP DIAGNOSIS:  Left breast mass x2.   POSTOPERATIVE DIAGNOSIS:  Left breast mass x2.   OPERATION:  Excision left breast mass x2.   SURGEON:  Currie Paris, M.D.   ANESTHESIA:  MAC.   CLINICAL HISTORY:  [name] is a 65 year old lady who has had bilateral  reductions; and has had some nodular densities in the left breast that have  been quite worrisome to her.  She has had evaluation and nothing has shown  up to be suspicious for malignancy.  One of the masses was about the size of  a B-B; was smooth, fairly hard, and right under the areolar edge.  It was  felt to be perhaps a calcified bit of scar tissue.  The second area was also  in the left breast at about the 7 to 9 o'clock position as a nodular  somewhat oblong density; again, at the areolar edge where she had had a  prior scar from her surgery.  After discussing with the patient, she elected  to proceed to excision of these two areas.   DESCRIPTION OF PROCEDURE:  The patient was seen in the holding area, and she  had no further questions.  The left side was marked as the operative side by  the patient and myself.  She was taken to the operating room and given IV  sedation.  The left breast was prepped and draped.  The time-out occurred.   I infiltrated a combination of 1% Xylocaine with epinephrine, mixed equally  with 0.5% plain Marcaine for local.  This was infiltrated around the areola  underneath the areola and nipple, and little bit more medially.   I made a curvilinear incision at the areolar edge.  I divided a little bit  of the  fatty tissue; and excised the area of the palpable nodularity that  was medially located.  This appeared to be scar tissue and fibrotic breast  tissue.  It did not feel to be malignant.   I then turned my attention to the 12 o'clock position; and just above the  incision which was right at the areolar edge, again, I found the tiny nodule  which really appeared to be part of the scar from her prior surgery.  This  was very hard B-B sized or smaller nodule; and this was excised.   Once everything was dry.  I closed with 3-0 Vicryl, 4-0 Monocryl  subcuticular, and Dermabond.  The patient tolerated the procedure well.  There no operative complications.  All counts were correct.      Currie Paris, M.D.  Electronically Signed     CJS/MEDQ  D:  06/27/2006  T:  06/27/2006  Job:  [id number]   cc:   Gwen Pounds, MD  Fax: [phone]

## 2011-05-03 NOTE — Procedures (Signed)
[facility]  Patient:    Amber [name], Amber [name]                     MRN: [id number] Proc. Date: 05/18/01 Adm. Date:  [id number] Attending:  Rich Brave CC:         Jonelle Sports. Cheryll Cockayne, M.D.   Procedure Report  PROCEDURE:  Colonoscopy with polypectomy.  INDICATIONS FOR PROCEDURE:  A 65 year old female with a family history of colon polyps in her father, for colon cancer screening.  FINDINGS:  A 5 mm polyp snared at about 20 cm.  DESCRIPTION OF PROCEDURE:  The nature, purpose and risk of the procedure had been discussed with the patient who provided written consent. Sedation was fentanyl 200 mcg and Versed 20 mg IV prior to and during this procedure and the upper endoscopy which preceded it. Despite this large amount of medication, the patient remained stable and in fact was readily arousable if not actually awake throughout both procedures.  The Olympus adult video colonoscope was advanced quite easily to the cecum and pullback was then performed. The quality of the prep was excellent and it was felt that all areas were well seen.  There was a 5 mm polyp snared at 20 cm. I also took random mucosal biopsies because of a question of frequent loose stools to rule out microscopic colitis.  No large polyps, cancer, colitis, vascular malformations or diverticulosis were observed and retroflexion of the rectum was unremarkable. The patient tolerated the procedure well and there were no apparent complications.  IMPRESSION:  Rectosigmoid polyp removed as described above. Family history of colon polyp in her father and colon cancer in one or two paternal uncles.  PLAN:  Await pathology. Consider follow-up colonoscopy in three to five years. D:  05/18/01 TD:  05/19/01 Job: [id number]

## 2011-07-09 ENCOUNTER — Encounter: Payer: Self-pay | Admitting: Internal Medicine

## 2011-07-09 ENCOUNTER — Ambulatory Visit (INDEPENDENT_AMBULATORY_CARE_PROVIDER_SITE_OTHER): Payer: Medicare Other | Admitting: Internal Medicine

## 2011-07-09 VITALS — BP 141/57 | HR 85 | Ht 64.0 in | Wt 154.0 lb

## 2011-07-09 DIAGNOSIS — Z959 Presence of cardiac and vascular implant and graft, unspecified: Secondary | ICD-10-CM | POA: Insufficient documentation

## 2011-07-09 DIAGNOSIS — R9431 Abnormal electrocardiogram [ECG] [EKG]: Secondary | ICD-10-CM

## 2011-07-09 DIAGNOSIS — R002 Palpitations: Secondary | ICD-10-CM

## 2011-07-09 NOTE — Assessment & Plan Note (Signed)
Interrogation as noted above 

## 2011-07-09 NOTE — Progress Notes (Signed)
HPI: Amber [name] is a 65 y.o. female  Seen  In followup of Palpitations Avoid she has 3 different syndromes. The first were to let this assist with ectopy. Dissection was irregular and rapid suggestive of atrial fibrillation and a third was regular and rapid suggestive of reentry. This occurs in the context of a cardiogram that was concerning for preexcitation. She has had minimal palpitations that she did activate the marginal on occasion.     Her cardiac evaluation has included an ultrasound that was normal a Myoview that was normal. However, because of cardiac enzyme leak associated with a hospitalization for the aforementioned tachycardia she was admitted for catheterization in mid March 2012 demonstrated normal left ventricular function and normal coronary arteries.     Current Outpatient Prescriptions  Medication Sig Dispense Refill  . amLODipine (NORVASC) 5 MG tablet Take 1 tablet (5 mg total) by mouth at bedtime.  90 tablet  3  . aspirin 81 MG tablet Take 81 mg by mouth daily.        . Cholecalciferol (VITAMIN D PO) Take by mouth daily.        Marland Kitchen esomeprazole (NEXIUM) 40 MG capsule Take 40 mg by mouth as needed.        . fish oil-omega-3 fatty acids 1000 MG capsule Take 2 g by mouth daily.        Marland Kitchen losartan (COZAAR) 100 MG tablet Take 100 mg by mouth daily.        . metoprolol (TOPROL-XL) 100 MG 24 hr tablet Take 150 mg by mouth daily. 100 MG IN THE MORNING, 50 MG IN THE EVENING      . propranolol (INDERAL) 10 MG tablet Take 10 mg by mouth as needed.        . rosuvastatin (CRESTOR) 5 MG tablet Take 5 mg by mouth 3 (three) times a week.        . sertraline (ZOLOFT) 50 MG tablet Take 25 mg by mouth daily.        Marland Kitchen DISCONTD: sertraline (ZOLOFT) 50 MG tablet Take 1 tablet (50 mg total) by mouth daily.  90 tablet  3    Allergies  Allergen Reactions  . Ciprofloxacin Itching and Swelling    Past Medical History  Diagnosis Date  . Chest pain     + Tn s/p cath 2012--Normal  . SOB  (shortness of breath)   . HTN (hypertension)   . Palpitations     three types  . 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 Obesity   . History of tobacco abuse   . Post-menopausal   . Chest tightness   . Dizziness   . GERD (gastroesophageal reflux disease)   . Right bundle branch block   . Hyperlipemia   . Abdominal mass     Pulsating    Past Surgical History  Procedure Date  . Breast reduction surgery     BILATERAL  . Carpal tunnel release   . Tonsillectomy and adenoidectomy AGE 79    Family History  Problem Relation Age of Onset  . Prostate cancer Father   . Heart failure Father   . Atrial fibrillation Mother   . Diabetes Father     History   Social History  . Marital Status: Married    Spouse Name: N/A    Number of Children: N/A  . Years of Education: N/A   Occupational History  . Not on file.   Social History Main Topics  .  Smoking status: Never Smoker   . Smokeless tobacco: Never Used  . Alcohol Use: Yes     Social alcohol  . Drug Use: No  . Sexually Active:    Other Topics Concern  . Not on file   Social History Narrative  . No narrative on file    Fourteen point review of systems was negative except as noted in HPI and PMH   PHYSICAL EXAMINATION  Blood pressure 141/57, pulse 85, height 5\' 4"  (1.626 m), weight 154 lb (69.854 kg).   Well developed and nourished Caucasian female appearing younger than her stated age in no acute distress Back without scoliosis or kyphosis Clear Regular rate and rhythm, no murmurs or gallops Abd-soft with active BS without hepatomegaly;  Femoral pulses 2+ distal pulses intact No Clubbing cyanosis edema Skin-warm and dry Device pocket is well healed A & Oriented CN 3-12 normal  Grossly normal sensory and motor function Affect engaging .

## 2011-07-09 NOTE — Patient Instructions (Signed)
Your physician recommends that you schedule a follow-up appointment in: 3 months with Kristin/ Paula for a device check.  Your physician recommends that you continue on your current medications as directed. Please refer to the Current Medication list given to you today.   

## 2011-07-09 NOTE — Assessment & Plan Note (Signed)
She has PACs on her strips that are associated with a broad QRS and a short PR interval suggestive of preexcitation

## 2011-07-09 NOTE — Assessment & Plan Note (Signed)
Palpitations were correlated with PACs and intermittent wide complex beats suggestive of aberration or preexcitation

## 2011-08-20 ENCOUNTER — Other Ambulatory Visit (INDEPENDENT_AMBULATORY_CARE_PROVIDER_SITE_OTHER): Payer: Self-pay | Admitting: Surgery

## 2011-08-20 DIAGNOSIS — Z1231 Encounter for screening mammogram for malignant neoplasm of breast: Secondary | ICD-10-CM

## 2011-09-06 LAB — POCT I-STAT 4, (NA,K, GLUC, HGB,HCT)
HCT: 42
Hemoglobin: 14.3
Operator id: 280881
Potassium: 4.1
Sodium: 135
Sodium: 135

## 2011-10-07 ENCOUNTER — Ambulatory Visit
Admission: RE | Admit: 2011-10-07 | Discharge: 2011-10-07 | Disposition: A | Discharge status: Home / Self Care | Payer: Medicare Other | Source: Ambulatory Visit | Attending: Surgery | Admitting: Surgery

## 2011-10-07 DIAGNOSIS — Z1231 Encounter for screening mammogram for malignant neoplasm of breast: Secondary | ICD-10-CM

## 2011-10-09 ENCOUNTER — Ambulatory Visit (INDEPENDENT_AMBULATORY_CARE_PROVIDER_SITE_OTHER): Payer: Medicare Other | Admitting: *Deleted

## 2011-10-09 ENCOUNTER — Encounter: Payer: Self-pay | Admitting: Internal Medicine

## 2011-10-09 DIAGNOSIS — R002 Palpitations: Secondary | ICD-10-CM

## 2011-10-09 LAB — PACEMAKER DEVICE OBSERVATION

## 2011-10-09 NOTE — Progress Notes (Signed)
IRL interrogation

## 2011-10-18 ENCOUNTER — Ambulatory Visit (INDEPENDENT_AMBULATORY_CARE_PROVIDER_SITE_OTHER): Payer: Self-pay | Admitting: Surgery

## 2011-10-30 ENCOUNTER — Encounter (INDEPENDENT_AMBULATORY_CARE_PROVIDER_SITE_OTHER): Payer: Self-pay | Admitting: General Surgery

## 2011-10-30 DIAGNOSIS — N6019 Diffuse cystic mastopathy of unspecified breast: Secondary | ICD-10-CM | POA: Insufficient documentation

## 2011-11-01 ENCOUNTER — Encounter (INDEPENDENT_AMBULATORY_CARE_PROVIDER_SITE_OTHER): Payer: Self-pay | Admitting: Surgery

## 2011-11-01 ENCOUNTER — Ambulatory Visit (INDEPENDENT_AMBULATORY_CARE_PROVIDER_SITE_OTHER): Payer: Medicare Other | Admitting: Surgery

## 2011-11-01 VITALS — BP 136/74 | HR 80 | Temp 96.8°F | Resp 18 | Ht 64.0 in | Wt 150.2 lb

## 2011-11-01 DIAGNOSIS — N6019 Diffuse cystic mastopathy of unspecified breast: Secondary | ICD-10-CM

## 2011-11-01 NOTE — Patient Instructions (Signed)
Call if any problems

## 2011-11-01 NOTE — Progress Notes (Signed)
  CC: Fibrocystic breast followup HPI: The patient has had a chronic history fibrocystic changes in the breast. She has dense breasts and has had bilateral reductions. She comes in for routine followup. She has had no symptoms develop.   ROS: At the chart reviewed and no significant issues here    PE GENERAL:  The patient is alert, oriented, and generally healthy-appearing, NAD. Mood and affect are normal.   BREASTS:  Well-healed surgical scars from her reduction. Her cardiac pacemaker is noted in the left upper inner quadrant. There are no suspicious areas and she is not tender.  LYMPHATICS: She is now axillary or supraclavicular adenopathy on either side.   Data Reviewed Mammogram last October was negative  Assessment Stable exam was noted. There are no suspicious changes. Continued dense breast tissue..  Plan Like to continue the six-month followup here. She'll have a mammogram in one year.

## 2011-12-03 ENCOUNTER — Ambulatory Visit: Payer: Medicare Other | Admitting: Cardiovascular Disease

## 2011-12-12 ENCOUNTER — Ambulatory Visit (INDEPENDENT_AMBULATORY_CARE_PROVIDER_SITE_OTHER): Payer: Medicare Other | Admitting: *Deleted

## 2011-12-12 DIAGNOSIS — R002 Palpitations: Secondary | ICD-10-CM

## 2012-01-13 ENCOUNTER — Encounter: Payer: Self-pay | Admitting: *Deleted

## 2012-01-15 ENCOUNTER — Encounter: Payer: Self-pay | Admitting: Cardiovascular Disease

## 2012-01-15 ENCOUNTER — Ambulatory Visit (INDEPENDENT_AMBULATORY_CARE_PROVIDER_SITE_OTHER): Payer: Medicare Other | Admitting: Cardiovascular Disease

## 2012-01-15 DIAGNOSIS — E785 Hyperlipidemia, unspecified: Secondary | ICD-10-CM

## 2012-01-15 DIAGNOSIS — I10 Essential (primary) hypertension: Secondary | ICD-10-CM | POA: Diagnosis not present

## 2012-01-15 DIAGNOSIS — R002 Palpitations: Secondary | ICD-10-CM

## 2012-01-15 MED ORDER — HYDROCHLOROTHIAZIDE 25 MG PO TABS: 25.0000 mg | ORAL_TABLET | Freq: Every day | ORAL | Status: DC

## 2012-01-15 MED ORDER — PROPRANOLOL HCL 10 MG PO TABS: 10.0000 mg | ORAL_TABLET | Freq: Four times a day (QID) | ORAL | Status: DC | PRN

## 2012-01-15 MED ORDER — POTASSIUM CHLORIDE CRYS ER 10 MEQ PO TBCR: 10.0000 meq | EXTENDED_RELEASE_TABLET | Freq: Every day | ORAL | Status: DC

## 2012-01-15 NOTE — Assessment & Plan Note (Signed)
Amber [name] is doing well .  She has not had any significant arrhythmias.  She has a loop recorder that is interrogated every 6 months.  Continue current meds

## 2012-01-15 NOTE — Patient Instructions (Signed)
Your physician recommends that you schedule a follow-up appointment in: 3 months  Your physician recommends that you return for a FASTING lipid profile:3 months  Your physician recommends that you return for lab work in: 1 month  Your physician has recommended you make the following change in your medication:   Start hctz 25 mg one tablet each morning= water pill Start potassium 10 meq one tablet with hctz pill daily

## 2012-01-15 NOTE — Progress Notes (Signed)
Amber [name] Date of Birth  [birth year] [facility]     [place] [facility]  [address]    [address]   [address] [place], [place]  [address]    [place], [place]  [address] [phone]  Fax  [phone]  [phone]  Fax [phone]  Problems: 1. Palpitations/tachycardia-she was hospitalized in March, 2012 with palpitations. She had a loop recorder implanted. Cardiac catheterization was normal. 2. Hypertension 3. History of chest tightness-negative stress Myoview study in March, 2012, Cardiac catheterization was normal March, 2012.  4. Hyperlipidemia  History of Present Illness:  Amber [name] is doing well.  Exercises regularly.  Still has occasional palps - controlled with propranolol.  She was hospitalized in March of 2012. She had a loop recorder implanted. Cardiac as a shoe was normal.  Current Outpatient Prescriptions on File Prior to Visit  Medication Sig Dispense Refill  . amLODipine (NORVASC) 5 MG tablet Take 10 mg by mouth at bedtime.        Marland Kitchen aspirin 81 MG tablet Take 81 mg by mouth daily.        . Cholecalciferol (VITAMIN D PO) Take by mouth daily.        Marland Kitchen esomeprazole (NEXIUM) 40 MG capsule Take 40 mg by mouth as needed.        . fish oil-omega-3 fatty acids 1000 MG capsule Take 2 g by mouth daily.        Marland Kitchen losartan (COZAAR) 100 MG tablet Take 100 mg by mouth daily.        . metoprolol (TOPROL-XL) 100 MG 24 hr tablet Take 150 mg by mouth daily. 100 MG IN THE MORNING, 50 MG IN THE EVENING      . propranolol (INDERAL) 10 MG tablet Take 10 mg by mouth as needed.        . rosuvastatin (CRESTOR) 5 MG tablet Take 5 mg by mouth 3 (three) times a week.        . sertraline (ZOLOFT) 50 MG tablet Take 50 mg by mouth daily.       Marland Kitchen DISCONTD: sertraline (ZOLOFT) 50 MG tablet Take 1 tablet (50 mg total) by mouth daily.  90 tablet  3    Allergies  Allergen Reactions  . Ciprofloxacin Itching and Swelling    Past Medical History  Diagnosis Date  . Chest pain      + Tn s/p cath 2012--Normal  . SOB (shortness of breath)   . HTN (hypertension)   . Palpitations     three types  . 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 Obesity   . History of tobacco abuse   . Post-menopausal   . Chest tightness   . Dizziness   . Right bundle branch block   . Hyperlipemia   . Abdominal mass     Pulsating  . Arthritis   . Osteoporosis     Past Surgical History  Procedure Date  . Breast reduction surgery     BILATERAL  . Carpal tunnel release   . Tonsillectomy and adenoidectomy AGE 72  . Ovarian cyst removal 1978  . Cardiac catheterization 02-25-2011    EF 60%. Left coronary artery arises and distributes normal, right coronary artery is a dominant vessel and is normal  . US echocardiography 12-23-2006    EF 55-60%  . Cardiovascular stress test 08-27-2002    EF 76%    History  Smoking status  . Never Smoker   Smokeless tobacco  .  Never Used    History  Alcohol Use  . 3.5 oz/week  . 7 drink(s) per week    Social alcohol    Family History  Problem Relation Age of Onset  . Prostate cancer Father   . Heart failure Father   . Atrial fibrillation Mother   . Diabetes Father     Reviw of Systems:  Reviewed in the HPI.  All other systems are negative.  Physical Exam: BP 144/80  Pulse 72  Ht 5\' 4"  (1.626 m)  Wt 153 lb 12.8 oz (69.763 kg)  BMI 26.40 kg/m2 The patient is alert and oriented x 3.  The mood and affect are normal.   Skin: warm and dry.  Color is normal.    HEENT:   Normocephalic/atraumatic. No JVD. Normal carotids  Lungs: Lungs are clear   Heart: Regular rate S1-S2.    Abdomen: Good bowel sounds. Did not feel any midline mass. No HSM.  Extremities:  No edema.  Neuro:  Gait is normal. Exam is nonfocal.    ECG:  Assessment / Plan:

## 2012-01-30 ENCOUNTER — Other Ambulatory Visit (INDEPENDENT_AMBULATORY_CARE_PROVIDER_SITE_OTHER): Payer: Medicare Other | Admitting: *Deleted

## 2012-01-30 DIAGNOSIS — R002 Palpitations: Secondary | ICD-10-CM | POA: Diagnosis not present

## 2012-01-30 DIAGNOSIS — I10 Essential (primary) hypertension: Secondary | ICD-10-CM | POA: Diagnosis not present

## 2012-01-30 DIAGNOSIS — E785 Hyperlipidemia, unspecified: Secondary | ICD-10-CM | POA: Diagnosis not present

## 2012-01-30 LAB — BASIC METABOLIC PANEL
BUN: 12 mg/dL (ref 6–23)
Calcium: 9.2 mg/dL (ref 8.4–10.5)
Creatinine, Ser: 0.6 mg/dL (ref 0.4–1.2)
GFR: 104.04 mL/min (ref >60.00)
Glucose, Bld: 115 mg/dL — ABNORMAL HIGH (ref 70–99)

## 2012-01-31 ENCOUNTER — Telehealth: Payer: Self-pay | Admitting: *Deleted

## 2012-01-31 DIAGNOSIS — E871 Hypo-osmolality and hyponatremia: Secondary | ICD-10-CM

## 2012-01-31 NOTE — Telephone Encounter (Signed)
Message copied by Burnell Blanks on Fri Jan 31, 2012  3:59 PM ------      Message from: [place], [place]      Created: Fri Jan 31, 2012  3:06 PM       The HCTZ has lowered her sodium and potassium.  Please have her decrease her HCTZ to 1/2 tab a day.  Limit free water. Increase potassium foods

## 2012-01-31 NOTE — Telephone Encounter (Signed)
Advised of labs and medication changes.  Will recheck in 1 week

## 2012-02-06 ENCOUNTER — Other Ambulatory Visit (INDEPENDENT_AMBULATORY_CARE_PROVIDER_SITE_OTHER): Payer: Medicare Other

## 2012-02-06 DIAGNOSIS — E871 Hypo-osmolality and hyponatremia: Secondary | ICD-10-CM

## 2012-02-06 LAB — BASIC METABOLIC PANEL
Calcium: 9 mg/dL (ref 8.4–10.5)
Creatinine, Ser: 0.6 mg/dL (ref 0.4–1.2)
GFR: 104.03 mL/min (ref >60.00)

## 2012-03-11 ENCOUNTER — Ambulatory Visit (INDEPENDENT_AMBULATORY_CARE_PROVIDER_SITE_OTHER): Payer: Medicare Other | Admitting: *Deleted

## 2012-03-11 ENCOUNTER — Encounter: Payer: Self-pay | Admitting: Internal Medicine

## 2012-03-11 DIAGNOSIS — R002 Palpitations: Secondary | ICD-10-CM

## 2012-03-11 LAB — PACEMAKER DEVICE OBSERVATION

## 2012-03-11 NOTE — Progress Notes (Signed)
ILR check 

## 2012-03-20 ENCOUNTER — Other Ambulatory Visit: Payer: Self-pay | Admitting: Specialist

## 2012-03-20 ENCOUNTER — Ambulatory Visit
Admission: RE | Admit: 2012-03-20 | Discharge: 2012-03-20 | Disposition: A | Discharge status: Home / Self Care | Payer: Medicare Other | Source: Ambulatory Visit | Attending: Specialist | Admitting: Specialist

## 2012-03-20 DIAGNOSIS — R52 Pain, unspecified: Secondary | ICD-10-CM

## 2012-03-20 DIAGNOSIS — M25469 Effusion, unspecified knee: Secondary | ICD-10-CM | POA: Diagnosis not present

## 2012-03-20 DIAGNOSIS — M25569 Pain in unspecified knee: Secondary | ICD-10-CM | POA: Diagnosis not present

## 2012-03-30 ENCOUNTER — Telehealth: Payer: Self-pay | Admitting: Cardiovascular Disease

## 2012-03-30 DIAGNOSIS — IMO0002 Reserved for concepts with insufficient information to code with codable children: Secondary | ICD-10-CM | POA: Diagnosis not present

## 2012-03-30 NOTE — Telephone Encounter (Signed)
PLEASE ADVISE.

## 2012-03-30 NOTE — Telephone Encounter (Signed)
These are OK as long as she is not having any symptoms

## 2012-03-30 NOTE — Telephone Encounter (Signed)
Patient was calling back to report B/P readings.States blood pressure ranging from 128/67 to 92/60.

## 2012-03-30 NOTE — Telephone Encounter (Signed)
lori told pt to call back this week to speak to cheryl re BP

## 2012-03-31 NOTE — Telephone Encounter (Signed)
lmtcb with husband. 

## 2012-03-31 NOTE — Telephone Encounter (Signed)
Fu call °Patient returning your call °

## 2012-03-31 NOTE — Telephone Encounter (Signed)
Returned call, msg left, told bp readings are ok unless not feeling well, please call back with questions or concerns.

## 2012-04-14 DIAGNOSIS — R350 Frequency of micturition: Secondary | ICD-10-CM | POA: Diagnosis not present

## 2012-04-14 DIAGNOSIS — N3010 Interstitial cystitis (chronic) without hematuria: Secondary | ICD-10-CM | POA: Diagnosis not present

## 2012-04-15 ENCOUNTER — Other Ambulatory Visit (INDEPENDENT_AMBULATORY_CARE_PROVIDER_SITE_OTHER): Payer: Medicare Other

## 2012-04-15 ENCOUNTER — Encounter: Payer: Self-pay | Admitting: Cardiovascular Disease

## 2012-04-15 ENCOUNTER — Ambulatory Visit (INDEPENDENT_AMBULATORY_CARE_PROVIDER_SITE_OTHER): Payer: Medicare Other | Admitting: Cardiovascular Disease

## 2012-04-15 VITALS — BP 130/74 | HR 67 | Ht 63.0 in | Wt 156.0 lb

## 2012-04-15 DIAGNOSIS — I10 Essential (primary) hypertension: Secondary | ICD-10-CM | POA: Diagnosis not present

## 2012-04-15 DIAGNOSIS — E785 Hyperlipidemia, unspecified: Secondary | ICD-10-CM

## 2012-04-15 DIAGNOSIS — R002 Palpitations: Secondary | ICD-10-CM | POA: Diagnosis not present

## 2012-04-15 DIAGNOSIS — N760 Acute vaginitis: Secondary | ICD-10-CM | POA: Diagnosis not present

## 2012-04-15 DIAGNOSIS — Z124 Encounter for screening for malignant neoplasm of cervix: Secondary | ICD-10-CM | POA: Diagnosis not present

## 2012-04-15 LAB — LIPID PANEL
Cholesterol: 192 mg/dL (ref 0–200)
HDL: 93.7 mg/dL (ref >39.00)
Triglycerides: 61 mg/dL (ref 0.0–149.0)
VLDL: 12.2 mg/dL (ref 0.0–40.0)

## 2012-04-15 LAB — BASIC METABOLIC PANEL
BUN: 10 mg/dL (ref 6–23)
GFR: 105.98 mL/min (ref >60.00)
Glucose, Bld: 116 mg/dL — ABNORMAL HIGH (ref 70–99)
Potassium: 4.3 mEq/L (ref 3.5–5.1)

## 2012-04-15 LAB — HEPATIC FUNCTION PANEL
ALT: 19 U/L (ref 0–35)
Total Bilirubin: 1 mg/dL (ref 0.3–1.2)

## 2012-04-15 MED ORDER — AMLODIPINE BESYLATE 5 MG PO TABS: 5.0000 mg | ORAL_TABLET | Freq: Every day | ORAL | Status: DC

## 2012-04-15 NOTE — Assessment & Plan Note (Signed)
Amber [name] is doing much better. Her blood pressure has been low. She stop her HCTZ and potassium herself because it caused her stomach pain.  She would also like to decrease her dose of amlodipine. Her blood pressure has been running a little bit low anyway.  She's been eating better and has been drinking less alcohol.    I'll see her again in 3 months.

## 2012-04-15 NOTE — Patient Instructions (Signed)
Your physician recommends that you schedule a follow-up appointment in: 3 MONTHS  Your physician recommends that you return for a FASTING lipid profile: TODAY  Your physician has recommended you make the following change in your medication:   DECREASE AMLODIPINE 5 MG DAILY DC HTCZ DC POTASSIUM

## 2012-04-15 NOTE — Progress Notes (Signed)
Amber [name] Date of Birth  [birth year] [facility]     [place] Office  [address]    [address]   [address] [place], [place]  [address]    [place], [place]  [address] [phone]  Fax  [phone]  [phone]  Fax [phone]  Problems: 1. Palpitations/tachycardia-she was hospitalized in March, 2012 with palpitations. She had a loop recorder implanted. Cardiac catheterization was normal. 2. Hypertension 3. History of chest tightness-negative stress Myoview study in March, 2012, Cardiac catheterization was normal March, 2012.  4. Hyperlipidemia  History of Present Illness:  Amber [name] is doing well.  Exercises regularly.  Still has occasional palps - controlled with propranolol.  She was hospitalized in March of 2012. She had a loop recorder implanted.     Current Outpatient Prescriptions on File Prior to Visit  Medication Sig Dispense Refill  . amLODipine (NORVASC) 5 MG tablet Take 10 mg by mouth at bedtime.       Marland Kitchen aspirin 81 MG tablet Take 81 mg by mouth daily.        . Cholecalciferol (VITAMIN D PO) Take by mouth daily.        Marland Kitchen esomeprazole (NEXIUM) 40 MG capsule Take 40 mg by mouth as needed.        . fish oil-omega-3 fatty acids 1000 MG capsule Take 2 g by mouth daily.        Marland Kitchen losartan (COZAAR) 100 MG tablet Take 100 mg by mouth daily.        . metoprolol (TOPROL-XL) 100 MG 24 hr tablet Take 150 mg by mouth daily. 100 MG IN THE MORNING, 50 MG IN THE EVENING      . propranolol (INDERAL) 10 MG tablet Take 1 tablet (10 mg total) by mouth 4 (four) times daily as needed. As needed for palpitations  100 tablet  1  . rosuvastatin (CRESTOR) 5 MG tablet Take 5 mg by mouth 3 (three) times a week.        . sertraline (ZOLOFT) 50 MG tablet Take 50 mg by mouth daily.         Allergies  Allergen Reactions  . Ciprofloxacin Itching and Swelling    Past Medical History  Diagnosis Date  . Chest pain     + Tn s/p cath 2012--Normal  . SOB (shortness of  breath)   . HTN (hypertension)   . Palpitations     three types  . 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 Obesity   . History of tobacco abuse   . Post-menopausal   . Chest tightness   . Dizziness   . Right bundle branch block   . Hyperlipemia   . Abdominal mass     Pulsating  . Arthritis   . Osteoporosis     Past Surgical History  Procedure Date  . Breast reduction surgery     BILATERAL  . Carpal tunnel release   . Tonsillectomy and adenoidectomy AGE 35  . Ovarian cyst removal 1978  . Cardiac catheterization 02-25-2011    EF 60%. Left coronary artery arises and distributes normal, right coronary artery is a dominant vessel and is normal  . US echocardiography 12-23-2006    EF 55-60%  . Cardiovascular stress test 08-27-2002    EF 76%    History  Smoking status  . Never Smoker   Smokeless tobacco  . Never Used    History  Alcohol Use  . 3.5 oz/week  .  7 drink(s) per week    Social alcohol    Family History  Problem Relation Age of Onset  . Prostate cancer Father   . Heart failure Father   . Atrial fibrillation Mother   . Diabetes Father     Reviw of Systems:  Reviewed in the HPI.  All other systems are negative.  Physical Exam: BP 130/74  Pulse 67  Ht 5\' 3"  (1.6 m)  Wt 156 lb (70.761 kg)  BMI 27.63 kg/m2 The patient is alert and oriented x 3.  The mood and affect are normal.   Skin: warm and dry.  Color is normal.    HEENT:   Normocephalic/atraumatic. No JVD. Normal carotids  Lungs: Lungs are clear   Heart: Regular rate S1-S2.    Abdomen: Good bowel sounds. Did not feel any midline mass. No HSM.  Extremities:  No edema.  Neuro:  Gait is normal. Exam is nonfocal.    ECG: Apr 15, 2012-normal sinus rhythm. There is an incomplete right bundle branch block. There are no changes from previous EKG   Assessment / Plan:

## 2012-04-15 NOTE — Assessment & Plan Note (Signed)
We'll check fasting lipids today. We'll also check a lipomed l med profile.

## 2012-04-16 LAB — NMR, LIPOPROFILE

## 2012-04-20 ENCOUNTER — Other Ambulatory Visit: Payer: Self-pay | Admitting: *Deleted

## 2012-05-05 ENCOUNTER — Encounter (INDEPENDENT_AMBULATORY_CARE_PROVIDER_SITE_OTHER): Payer: Self-pay | Admitting: Surgery

## 2012-05-05 ENCOUNTER — Other Ambulatory Visit: Payer: Self-pay | Admitting: Cardiovascular Disease

## 2012-05-05 ENCOUNTER — Ambulatory Visit (INDEPENDENT_AMBULATORY_CARE_PROVIDER_SITE_OTHER): Payer: Medicare Other | Admitting: Surgery

## 2012-05-05 VITALS — BP 142/88 | HR 72 | Temp 98.2°F | Resp 16 | Ht 64.0 in | Wt 159.2 lb

## 2012-05-05 DIAGNOSIS — N6019 Diffuse cystic mastopathy of unspecified breast: Secondary | ICD-10-CM

## 2012-05-05 NOTE — Progress Notes (Signed)
  CC: Fibrocystic breast followup HPI: The patient has had a chronic history fibrocystic changes in the breast. She has dense breasts and has had bilateral reductions. She comes in for routine followup. She has had no symptoms develop.   ROS: No changes no problems    PE VS: BP 142/88  Pulse 72  Temp(Src) 98.2 F (36.8 C) (Temporal)  Resp 16  Ht 5\' 4"  (1.626 m)  Wt 159 lb 3.2 oz (72.213 kg)  BMI 27.33 kg/m2  GENERAL:  The patient is alert, oriented, and generally healthy-appearing, NAD. Mood and affect are normal.   BREASTS:  Well-healed surgical scars from her reduction. Her cardiac pacemaker is noted in the left upper inner quadrant. There are no suspicious areas and she is not tender.  LYMPHATICS: She is now axillary or supraclavicular adenopathy on either side.   Data Reviewed Mammogram last October was negative  Assessment Stable exam was noted. There are no suspicious changes. Continued dense breast tissue..  Plan Like to continue the six-month followup here. She'll have a mammogram in [place]

## 2012-05-05 NOTE — Patient Instructions (Signed)
See me again in six months, after your mammogram 

## 2012-05-18 DIAGNOSIS — R7301 Impaired fasting glucose: Secondary | ICD-10-CM | POA: Diagnosis not present

## 2012-05-18 DIAGNOSIS — N6019 Diffuse cystic mastopathy of unspecified breast: Secondary | ICD-10-CM | POA: Diagnosis not present

## 2012-05-18 DIAGNOSIS — Z0000 Encounter for general adult medical examination without abnormal findings: Secondary | ICD-10-CM | POA: Diagnosis not present

## 2012-05-18 DIAGNOSIS — J309 Allergic rhinitis, unspecified: Secondary | ICD-10-CM | POA: Diagnosis not present

## 2012-05-19 ENCOUNTER — Other Ambulatory Visit: Payer: Self-pay | Admitting: Nurse Practitioner

## 2012-06-08 ENCOUNTER — Encounter: Payer: Self-pay | Admitting: Nurse Practitioner

## 2012-06-09 ENCOUNTER — Encounter: Payer: Self-pay | Admitting: Internal Medicine

## 2012-06-09 ENCOUNTER — Ambulatory Visit (INDEPENDENT_AMBULATORY_CARE_PROVIDER_SITE_OTHER): Payer: Medicare Other | Admitting: Internal Medicine

## 2012-06-09 VITALS — BP 137/79 | HR 74 | Wt 158.0 lb

## 2012-06-09 DIAGNOSIS — L819 Disorder of pigmentation, unspecified: Secondary | ICD-10-CM | POA: Diagnosis not present

## 2012-06-09 DIAGNOSIS — Z808 Family history of malignant neoplasm of other organs or systems: Secondary | ICD-10-CM | POA: Diagnosis not present

## 2012-06-09 DIAGNOSIS — D485 Neoplasm of uncertain behavior of skin: Secondary | ICD-10-CM | POA: Diagnosis not present

## 2012-06-09 DIAGNOSIS — Z959 Presence of cardiac and vascular implant and graft, unspecified: Secondary | ICD-10-CM

## 2012-06-09 DIAGNOSIS — R002 Palpitations: Secondary | ICD-10-CM

## 2012-06-09 NOTE — Patient Instructions (Signed)
Your physician recommends that you schedule a follow-up appointment in: 3 months with Kristin/Paula for a device check.  Your physician wants you to follow-up in: 6 months with Dr. Klein. You will receive a reminder letter in the mail two months in advance. If you don't receive a letter, please call our office to schedule the follow-up appointment.  Your physician recommends that you continue on your current medications as directed. Please refer to the Current Medication list given to you today.  

## 2012-06-09 NOTE — Progress Notes (Signed)
HPI  Amber [name] is a 66 y.o. female Seen in followup for this recorder implanted for palpitations.  She had distinct symptoms including irregular beats concerning for atrial fibrillation, abrupt onset and offset palpitations in the context of possible WPW as well as blips  History of Cath 2012>>normal  Past Medical History  Diagnosis Date  . Chest pain     + Tn s/p cath 2012--Normal  . SOB (shortness of breath)   . HTN (hypertension)   . Palpitations     three types  . 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 Obesity   . History of tobacco abuse   . Post-menopausal   . Chest tightness   . Dizziness   . Right bundle branch block   . Hyperlipemia   . Abdominal mass     Pulsating  . Arthritis   . Osteoporosis     Past Surgical History  Procedure Date  . Breast reduction surgery     BILATERAL  . Carpal tunnel release   . Tonsillectomy and adenoidectomy AGE 57  . Ovarian cyst removal 1978  . Cardiac catheterization 02-25-2011    EF 60%. Left coronary artery arises and distributes normal, right coronary artery is a dominant vessel and is normal  . US echocardiography 12-23-2006    EF 55-60%  . Cardiovascular stress test 08-27-2002    EF 76%  . Breast biopsy     left breast- benign    Current Outpatient Prescriptions  Medication Sig Dispense Refill  . amLODipine (NORVASC) 5 MG tablet Take 1 tablet (5 mg total) by mouth daily.  30 tablet  5  . amoxicillin (AMOXIL) 500 MG capsule 4 times daily.      Marland Kitchen aspirin 81 MG tablet Take 81 mg by mouth daily.        Marland Kitchen BIOTIN PO Take by mouth as needed.      . Cholecalciferol (VITAMIN D PO) Take by mouth daily.        Marland Kitchen esomeprazole (NEXIUM) 40 MG capsule Take 40 mg by mouth as needed.        . fish oil-omega-3 fatty acids 1000 MG capsule Take 2 g by mouth daily.        Marland Kitchen losartan (COZAAR) 100 MG tablet Take 100 mg by mouth daily.        . metoprolol succinate (TOPROL-XL) 100 MG 24 hr tablet TAKE ONE TABLET BY MOUTH EVERY MORNING AND   1/2 TABLET BY MOUTH EVERY NIGHT AT BEDTIME  135 tablet  3  . propranolol (INDERAL) 10 MG tablet Take 1 tablet (10 mg total) by mouth 4 (four) times daily as needed. As needed for palpitations  100 tablet  1  . rosuvastatin (CRESTOR) 5 MG tablet Take 5 mg by mouth 3 (three) times a week.        . sertraline (ZOLOFT) 50 MG tablet Take 50 mg by mouth daily.       . sertraline (ZOLOFT) 50 MG tablet TAKE 1 TABLET BY MOUTH EVERY DAY  90 tablet  2    Allergies  Allergen Reactions  . Ciprofloxacin Itching and Swelling    Review of Systems negative except from HPI and PMH  Physical Exam BP 137/79  Pulse 74  Wt 158 lb (71.668 kg) Well developed and nourished in no acute distress HENT normal Neck supple Clear Regular rate and rhythm, no murmurs or gallops Abd-soft with active BS No Clubbing cyanosis edema Skin-warm and dry A &  Oriented  Grossly normal sensory and motor function    Assessment and  Plan

## 2012-06-10 NOTE — Assessment & Plan Note (Signed)
As above.

## 2012-06-10 NOTE — Assessment & Plan Note (Signed)
intergoattion shows non sustained atrial tach <10sec

## 2012-06-30 ENCOUNTER — Telehealth: Payer: Self-pay | Admitting: Internal Medicine

## 2012-06-30 NOTE — Telephone Encounter (Signed)
Pt scheduled for loop check on 07-09-12 @ 1200. Husband has appt that day also.

## 2012-06-30 NOTE — Telephone Encounter (Signed)
New msg Pt has loop recorder and has 4 things on it and wants to know when she can download info. Please call

## 2012-07-02 DIAGNOSIS — Z1212 Encounter for screening for malignant neoplasm of rectum: Secondary | ICD-10-CM | POA: Diagnosis not present

## 2012-07-09 ENCOUNTER — Ambulatory Visit (INDEPENDENT_AMBULATORY_CARE_PROVIDER_SITE_OTHER): Payer: Medicare Other | Admitting: *Deleted

## 2012-07-09 ENCOUNTER — Encounter: Payer: Self-pay | Admitting: Internal Medicine

## 2012-07-09 DIAGNOSIS — R002 Palpitations: Secondary | ICD-10-CM

## 2012-07-09 NOTE — Progress Notes (Signed)
Loop recorder check in clinic  

## 2012-07-16 ENCOUNTER — Ambulatory Visit: Payer: Medicare Other | Admitting: Cardiovascular Disease

## 2012-07-27 ENCOUNTER — Telehealth: Payer: Self-pay | Admitting: Internal Medicine

## 2012-07-27 NOTE — Telephone Encounter (Signed)
New msg Pt wants to talk to you about st jude monitor please call

## 2012-07-29 NOTE — Telephone Encounter (Signed)
LMOM for pt to return call/kwm  

## 2012-08-04 NOTE — Telephone Encounter (Signed)
Gunnar Fusi took care of this in regards to monitor.

## 2012-08-05 ENCOUNTER — Other Ambulatory Visit: Payer: Self-pay | Admitting: *Deleted

## 2012-08-05 DIAGNOSIS — E785 Hyperlipidemia, unspecified: Secondary | ICD-10-CM

## 2012-08-05 DIAGNOSIS — I10 Essential (primary) hypertension: Secondary | ICD-10-CM

## 2012-08-05 MED ORDER — AMLODIPINE BESYLATE 5 MG PO TABS: 5.0000 mg | ORAL_TABLET | Freq: Every day | ORAL | Status: DC

## 2012-08-05 NOTE — Telephone Encounter (Signed)
Fax Received. Refill Completed. Amber [name] (R.M.A)   

## 2012-08-14 ENCOUNTER — Telehealth: Payer: Self-pay | Admitting: Internal Medicine

## 2012-08-14 NOTE — Telephone Encounter (Signed)
Pt wants Gunnar Fusi to know she received 2 new devices, will bring the extra device in to her next visit.  If you need to call her she can be reached on mobile # [phone]

## 2012-08-31 ENCOUNTER — Encounter: Payer: Self-pay | Admitting: Cardiovascular Disease

## 2012-08-31 ENCOUNTER — Other Ambulatory Visit (INDEPENDENT_AMBULATORY_CARE_PROVIDER_SITE_OTHER): Payer: Self-pay | Admitting: Surgery

## 2012-08-31 DIAGNOSIS — Z1231 Encounter for screening mammogram for malignant neoplasm of breast: Secondary | ICD-10-CM

## 2012-09-01 ENCOUNTER — Other Ambulatory Visit: Payer: Self-pay | Admitting: Gastroenterology

## 2012-09-01 DIAGNOSIS — D131 Benign neoplasm of stomach: Secondary | ICD-10-CM | POA: Diagnosis not present

## 2012-09-01 DIAGNOSIS — Z86010 Personal history of colonic polyps: Secondary | ICD-10-CM | POA: Diagnosis not present

## 2012-09-01 DIAGNOSIS — D126 Benign neoplasm of colon, unspecified: Secondary | ICD-10-CM | POA: Diagnosis not present

## 2012-09-01 DIAGNOSIS — K219 Gastro-esophageal reflux disease without esophagitis: Secondary | ICD-10-CM | POA: Diagnosis not present

## 2012-09-02 DIAGNOSIS — M899 Disorder of bone, unspecified: Secondary | ICD-10-CM | POA: Diagnosis not present

## 2012-09-02 DIAGNOSIS — M949 Disorder of cartilage, unspecified: Secondary | ICD-10-CM | POA: Diagnosis not present

## 2012-09-02 DIAGNOSIS — Z23 Encounter for immunization: Secondary | ICD-10-CM | POA: Diagnosis not present

## 2012-09-03 ENCOUNTER — Ambulatory Visit (INDEPENDENT_AMBULATORY_CARE_PROVIDER_SITE_OTHER): Payer: Medicare Other | Admitting: *Deleted

## 2012-09-03 DIAGNOSIS — R002 Palpitations: Secondary | ICD-10-CM | POA: Diagnosis not present

## 2012-09-03 LAB — PACEMAKER DEVICE OBSERVATION

## 2012-09-03 NOTE — Progress Notes (Signed)
ILR interrogation 

## 2012-09-07 ENCOUNTER — Encounter: Payer: Self-pay | Admitting: Internal Medicine

## 2012-10-20 ENCOUNTER — Ambulatory Visit
Admission: RE | Admit: 2012-10-20 | Discharge: 2012-10-20 | Disposition: A | Discharge status: Home / Self Care | Payer: Medicare Other | Source: Ambulatory Visit | Attending: Surgery | Admitting: Surgery

## 2012-10-20 DIAGNOSIS — Z1231 Encounter for screening mammogram for malignant neoplasm of breast: Secondary | ICD-10-CM | POA: Diagnosis not present

## 2012-10-21 ENCOUNTER — Encounter (INDEPENDENT_AMBULATORY_CARE_PROVIDER_SITE_OTHER): Payer: Self-pay | Admitting: Surgery

## 2012-10-21 ENCOUNTER — Telehealth (INDEPENDENT_AMBULATORY_CARE_PROVIDER_SITE_OTHER): Payer: Self-pay | Admitting: General Surgery

## 2012-10-21 ENCOUNTER — Ambulatory Visit (INDEPENDENT_AMBULATORY_CARE_PROVIDER_SITE_OTHER): Payer: Medicare Other | Admitting: Surgery

## 2012-10-21 VITALS — BP 136/88 | HR 76 | Resp 14 | Ht 65.0 in | Wt 164.0 lb

## 2012-10-21 DIAGNOSIS — N6019 Diffuse cystic mastopathy of unspecified breast: Secondary | ICD-10-CM | POA: Diagnosis not present

## 2012-10-21 NOTE — Telephone Encounter (Signed)
Patient aware mammogram is negative. She will call with any questions.

## 2012-10-21 NOTE — Patient Instructions (Signed)
Continue annual mammograms and see me again as needed, or in six months

## 2012-10-21 NOTE — Progress Notes (Signed)
  CC: Fibrocystic breast followup HPI: The patient has had a chronic history fibrocystic changes in the breast. She has dense breasts and has had bilateral reductions. She comes in for routine followup. She has had no symptoms develop.   ROS: No changes no problems    PE VS: BP 136/88  Pulse 76  Resp 14  Ht 5\' 5"  (1.651 m)  Wt 164 lb (74.39 kg)  BMI 27.29 kg/m2  GENERAL:  The patient is alert, oriented, and generally healthy-appearing, NAD. Mood and affect are normal.   BREASTS:  Well-healed surgical scars from her reduction. Her cardiac pacemaker is noted in the left upper inner quadrant. There are no suspicious areas and she is not tender.  LYMPHATICS: She is now axillary or supraclavicular adenopathy on either side.   Data Reviewed Mammogram done 11/05 > result pending  Assessment Stable exam was noted. There are no suspicious changes. Continued dense breast tissue..  Plan Like to continue the six-month followup here. She'll have a mammogram in [place]

## 2012-11-10 DIAGNOSIS — L819 Disorder of pigmentation, unspecified: Secondary | ICD-10-CM | POA: Diagnosis not present

## 2012-11-10 DIAGNOSIS — D485 Neoplasm of uncertain behavior of skin: Secondary | ICD-10-CM | POA: Diagnosis not present

## 2012-11-10 DIAGNOSIS — L719 Rosacea, unspecified: Secondary | ICD-10-CM | POA: Diagnosis not present

## 2012-11-10 DIAGNOSIS — L608 Other nail disorders: Secondary | ICD-10-CM | POA: Diagnosis not present

## 2012-11-10 DIAGNOSIS — L821 Other seborrheic keratosis: Secondary | ICD-10-CM | POA: Diagnosis not present

## 2012-11-10 DIAGNOSIS — D239 Other benign neoplasm of skin, unspecified: Secondary | ICD-10-CM | POA: Diagnosis not present

## 2012-11-23 DIAGNOSIS — H40019 Open angle with borderline findings, low risk, unspecified eye: Secondary | ICD-10-CM | POA: Diagnosis not present

## 2012-11-23 DIAGNOSIS — H52209 Unspecified astigmatism, unspecified eye: Secondary | ICD-10-CM | POA: Diagnosis not present

## 2012-11-23 DIAGNOSIS — H40059 Ocular hypertension, unspecified eye: Secondary | ICD-10-CM | POA: Diagnosis not present

## 2012-12-01 DIAGNOSIS — M21619 Bunion of unspecified foot: Secondary | ICD-10-CM | POA: Diagnosis not present

## 2012-12-01 DIAGNOSIS — S90129A Contusion of unspecified lesser toe(s) without damage to nail, initial encounter: Secondary | ICD-10-CM | POA: Diagnosis not present

## 2012-12-01 DIAGNOSIS — D485 Neoplasm of uncertain behavior of skin: Secondary | ICD-10-CM | POA: Diagnosis not present

## 2012-12-01 DIAGNOSIS — L851 Acquired keratosis [keratoderma] palmaris et plantaris: Secondary | ICD-10-CM | POA: Diagnosis not present

## 2012-12-08 ENCOUNTER — Encounter: Payer: Medicare Other | Admitting: Internal Medicine

## 2012-12-22 DIAGNOSIS — S90129A Contusion of unspecified lesser toe(s) without damage to nail, initial encounter: Secondary | ICD-10-CM | POA: Diagnosis not present

## 2012-12-22 DIAGNOSIS — M21619 Bunion of unspecified foot: Secondary | ICD-10-CM | POA: Diagnosis not present

## 2012-12-22 DIAGNOSIS — D485 Neoplasm of uncertain behavior of skin: Secondary | ICD-10-CM | POA: Diagnosis not present

## 2012-12-23 ENCOUNTER — Encounter: Payer: Self-pay | Admitting: Internal Medicine

## 2012-12-23 ENCOUNTER — Ambulatory Visit (INDEPENDENT_AMBULATORY_CARE_PROVIDER_SITE_OTHER): Payer: Medicare Other | Admitting: *Deleted

## 2012-12-23 DIAGNOSIS — R002 Palpitations: Secondary | ICD-10-CM

## 2012-12-23 LAB — PACEMAKER DEVICE OBSERVATION

## 2012-12-23 NOTE — Progress Notes (Signed)
ILR interrogation 

## 2012-12-25 DIAGNOSIS — M899 Disorder of bone, unspecified: Secondary | ICD-10-CM | POA: Diagnosis not present

## 2012-12-25 DIAGNOSIS — R7301 Impaired fasting glucose: Secondary | ICD-10-CM | POA: Diagnosis not present

## 2012-12-25 DIAGNOSIS — J309 Allergic rhinitis, unspecified: Secondary | ICD-10-CM | POA: Diagnosis not present

## 2012-12-25 DIAGNOSIS — R002 Palpitations: Secondary | ICD-10-CM | POA: Diagnosis not present

## 2012-12-25 DIAGNOSIS — M949 Disorder of cartilage, unspecified: Secondary | ICD-10-CM | POA: Diagnosis not present

## 2012-12-28 ENCOUNTER — Other Ambulatory Visit: Payer: Self-pay | Admitting: *Deleted

## 2012-12-28 MED ORDER — PROPRANOLOL HCL 10 MG PO TABS: 10.0000 mg | ORAL_TABLET | Freq: Four times a day (QID) | ORAL | Status: DC | PRN

## 2012-12-28 NOTE — Telephone Encounter (Signed)
Fax Received. Refill Completed. Mishael Krysiak Chowoe (R.M.A)   

## 2012-12-29 ENCOUNTER — Other Ambulatory Visit: Payer: Self-pay | Admitting: *Deleted

## 2012-12-29 NOTE — Telephone Encounter (Signed)
Opened in Error.

## 2013-01-12 ENCOUNTER — Encounter: Payer: Self-pay | Admitting: Internal Medicine

## 2013-01-12 ENCOUNTER — Ambulatory Visit (INDEPENDENT_AMBULATORY_CARE_PROVIDER_SITE_OTHER): Payer: Medicare Other | Admitting: Internal Medicine

## 2013-01-12 VITALS — BP 158/76 | HR 78 | Ht 64.5 in | Wt 165.6 lb

## 2013-01-12 DIAGNOSIS — R002 Palpitations: Secondary | ICD-10-CM | POA: Diagnosis not present

## 2013-01-12 DIAGNOSIS — Z959 Presence of cardiac and vascular implant and graft, unspecified: Secondary | ICD-10-CM

## 2013-01-12 DIAGNOSIS — I4949 Other premature depolarization: Secondary | ICD-10-CM | POA: Diagnosis not present

## 2013-01-12 DIAGNOSIS — I493 Ventricular premature depolarization: Secondary | ICD-10-CM

## 2013-01-12 NOTE — Progress Notes (Signed)
Patient Care Team: Gwen Pounds, MD as PCP - General (Internal Medicine)   HPI  Amber [name] is a 67 y.o. female Seen in followup for loop recorder implanted for palpitations.  She had distinct symptoms including irregular beats concerning for atrial fibrillation, abrupt onset and offset palpitations in the context of possible WPW as well as blips   The patient denies chest pain, shortness of breath, nocturnal dyspnea, orthopnea or peripheral edema.  There have been no  lightheadedness or syncope. She has had some palpitations which are her flip-flops.    History of Cath 2012>>normal   Past Medical History  Diagnosis Date  . Chest pain     + Tn s/p cath 2012--Normal  . SOB (shortness of breath)   . HTN (hypertension)   . Palpitations     three types  . 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 Obesity   . History of tobacco abuse   . Post-menopausal   . Chest tightness   . Dizziness   . Right bundle branch block   . Hyperlipemia   . Abdominal mass     Pulsating  . Arthritis   . Osteoporosis     Past Surgical History  Procedure Date  . Breast reduction surgery     BILATERAL  . Carpal tunnel release   . Tonsillectomy and adenoidectomy AGE 2  . Ovarian cyst removal 1978  . Cardiac catheterization 02-25-2011    EF 60%. Left coronary artery arises and distributes normal, right coronary artery is a dominant vessel and is normal  . US echocardiography 12-23-2006    EF 55-60%  . Cardiovascular stress test 08-27-2002    EF 76%  . Breast biopsy     left breast- benign    Current Outpatient Prescriptions  Medication Sig Dispense Refill  . amLODipine (NORVASC) 5 MG tablet Take 1 tablet (5 mg total) by mouth daily.  30 tablet  5  . aspirin 81 MG tablet Take 81 mg by mouth daily.        . Cholecalciferol (VITAMIN D PO) Take by mouth daily.        Marland Kitchen esomeprazole (NEXIUM) 40 MG capsule Take 40 mg by mouth as needed.        . fish oil-omega-3 fatty acids 1000 MG capsule Take 2 g  by mouth daily.        Marland Kitchen losartan (COZAAR) 100 MG tablet Take 100 mg by mouth daily.        . metoprolol succinate (TOPROL-XL) 100 MG 24 hr tablet Take 100 mg by mouth daily.       . propranolol (INDERAL) 10 MG tablet Take 1 tablet (10 mg total) by mouth 4 (four) times daily as needed. As needed for palpitations  100 tablet  1  . sertraline (ZOLOFT) 25 MG tablet Take 50 mg by mouth daily.       . rosuvastatin (CRESTOR) 5 MG tablet Take 5 mg by mouth 2 (two) times a week.         Allergies  Allergen Reactions  . Ciprofloxacin Itching and Swelling    Review of Systems negative except from HPI and PMH  Physical Exam BP 158/76  Pulse 78  Ht 5' 4.5" (1.638 m)  Wt 165 lb 9.6 oz (75.116 kg)  BMI 27.99 kg/m2   Well developed and nourished in no acute distress HENT normal Neck supple with JVP-flat Clear Regular rate and rhythm, no murmurs or gallops Abd-soft with active  BS No Clubbing cyanosis edema Skin-warm and dry A & Oriented  Grossly normal sensory and motor function    Assessment and  Plan

## 2013-01-12 NOTE — Patient Instructions (Addendum)
Your physician recommends that you schedule a follow-up appointment in: 3 months in the device clinic  

## 2013-01-12 NOTE — Assessment & Plan Note (Signed)
The patient's device was interrogated.  The information was reviewed. No changes were made in the programming.    

## 2013-01-12 NOTE — Assessment & Plan Note (Signed)
stable °

## 2013-04-02 ENCOUNTER — Encounter: Payer: Self-pay | Admitting: Internal Medicine

## 2013-04-02 ENCOUNTER — Ambulatory Visit (INDEPENDENT_AMBULATORY_CARE_PROVIDER_SITE_OTHER): Payer: Medicare Other | Admitting: Internal Medicine

## 2013-04-02 VITALS — BP 140/80 | HR 80 | Ht 64.0 in | Wt 172.6 lb

## 2013-04-02 DIAGNOSIS — I4891 Unspecified atrial fibrillation: Secondary | ICD-10-CM | POA: Diagnosis not present

## 2013-04-02 DIAGNOSIS — I493 Ventricular premature depolarization: Secondary | ICD-10-CM

## 2013-04-02 DIAGNOSIS — I4949 Other premature depolarization: Secondary | ICD-10-CM

## 2013-04-02 MED ORDER — APIXABAN 5 MG PO TABS: 5.0000 mg | ORAL_TABLET | Freq: Two times a day (BID) | ORAL | Status: DC

## 2013-04-02 NOTE — Progress Notes (Signed)
Patient Care Team: Gwen Pounds, MD as PCP - General (Internal Medicine)   HPI  Amber [name] is a 67 y.o. female Seen in followup for loop recorder implanted for palpitations.  She had distinct symptoms including irregular beats concerning for atrial fibrillation, abrupt onset and offset palpitations in the context of possible WPW as well as blips  She has had some symptoms of the irregularity that occur at night. She activated her monitor on one of them.  Thromboembolic risk factors are normal for age, hypertension, and gender for a CHADS-VASc score of 3    .      Past Medical History  Diagnosis Date  . Chest pain     + Tn s/p cath 2012--Normal  . SOB (shortness of breath)   . HTN (hypertension)   . Palpitations     three types  . 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 Obesity   . History of tobacco abuse   . Post-menopausal   . Chest tightness   . Dizziness   . Right bundle branch block   . Hyperlipemia   . Abdominal mass     Pulsating  . Arthritis   . Osteoporosis     Past Surgical History  Procedure Laterality Date  . Breast reduction surgery      BILATERAL  . Carpal tunnel release    . Tonsillectomy and adenoidectomy  AGE 15  . Ovarian cyst removal  1978  . Cardiac catheterization  02-25-2011    EF 60%. Left coronary artery arises and distributes normal, right coronary artery is a dominant vessel and is normal  . US echocardiography  12-23-2006    EF 55-60%  . Cardiovascular stress test  08-27-2002    EF 76%  . Breast biopsy      left breast- benign    Current Outpatient Prescriptions  Medication Sig Dispense Refill  . amLODipine (NORVASC) 5 MG tablet Take 1 tablet (5 mg total) by mouth daily.  30 tablet  5  . aspirin 81 MG tablet Take 81 mg by mouth daily.        . Cholecalciferol (VITAMIN D PO) Take by mouth daily.        Marland Kitchen esomeprazole (NEXIUM) 40 MG capsule Take 40 mg by mouth as needed.        . fish oil-omega-3 fatty acids 1000 MG capsule Take 2 g  by mouth daily.        Marland Kitchen losartan (COZAAR) 100 MG tablet Take 100 mg by mouth daily.        . metoprolol succinate (TOPROL-XL) 100 MG 24 hr tablet Take 100 mg by mouth daily.       . propranolol (INDERAL) 10 MG tablet Take 1 tablet (10 mg total) by mouth 4 (four) times daily as needed. As needed for palpitations  100 tablet  1  . sertraline (ZOLOFT) 25 MG tablet Take 50 mg by mouth daily.        No current facility-administered medications for this visit.    Allergies  Allergen Reactions  . Ciprofloxacin Itching and Swelling    Review of Systems negative except from HPI and PMH  Physical Exam BP 140/80  Pulse 80  Ht 5\' 4"  (1.626 m)  Wt 172 lb 9.6 oz (78.291 kg)  BMI 29.61 kg/m2 Well developed and nourished in no acute distress HENT normal Neck supple with JVP-flat Clear Regular rate and rhythm, no murmurs or gallops Abd-soft with active BS No  Clubbing cyanosis edema Skin-warm and dry A & Oriented  Grossly normal sensory and motor function    Assessment and  Plan

## 2013-04-02 NOTE — Assessment & Plan Note (Signed)
The patient has documented atrial fibrillation. This is a recurring phenomenon mostly in the mornings. She's taking a beta blocker and I wondered what degree she needed having enhancement of a vagal atrial fibrillation trigger by the beta blocker.  Her thromboembolic risk profile speak to the need for anticoagulation. We have discussed risks and benefits and we will begin her on apixaban.  We'll review her to weeks then how she is doing. At that time we will transition her from a beta blocker to calcium blocker to see if we can decrease the frequency of her nocturnal episodes.

## 2013-04-02 NOTE — Patient Instructions (Addendum)
Your physician recommends that you schedule a follow-up appointment in: 2 months with Dr Graciela Husbands  Your physician has recommended you make the following change in your medication: STOP Aspirin and START Eliquis 5 mg twice daily

## 2013-04-13 ENCOUNTER — Other Ambulatory Visit: Payer: Self-pay | Admitting: *Deleted

## 2013-04-13 DIAGNOSIS — I10 Essential (primary) hypertension: Secondary | ICD-10-CM

## 2013-04-13 DIAGNOSIS — E785 Hyperlipidemia, unspecified: Secondary | ICD-10-CM

## 2013-04-13 MED ORDER — AMLODIPINE BESYLATE 5 MG PO TABS: 5.0000 mg | ORAL_TABLET | Freq: Every day | ORAL | Status: DC

## 2013-04-13 NOTE — Telephone Encounter (Signed)
Fax Received. Refill Completed. Amber [name] (R.M.A)   

## 2013-04-16 ENCOUNTER — Other Ambulatory Visit: Payer: Self-pay | Admitting: *Deleted

## 2013-04-16 DIAGNOSIS — E785 Hyperlipidemia, unspecified: Secondary | ICD-10-CM

## 2013-04-16 DIAGNOSIS — I10 Essential (primary) hypertension: Secondary | ICD-10-CM

## 2013-04-16 MED ORDER — AMLODIPINE BESYLATE 5 MG PO TABS: 5.0000 mg | ORAL_TABLET | Freq: Every day | ORAL | Status: DC

## 2013-04-20 ENCOUNTER — Other Ambulatory Visit: Payer: Self-pay | Admitting: *Deleted

## 2013-04-20 DIAGNOSIS — E785 Hyperlipidemia, unspecified: Secondary | ICD-10-CM

## 2013-04-20 DIAGNOSIS — I10 Essential (primary) hypertension: Secondary | ICD-10-CM

## 2013-04-20 MED ORDER — AMLODIPINE BESYLATE 5 MG PO TABS: 5.0000 mg | ORAL_TABLET | Freq: Every day | ORAL | Status: DC

## 2013-04-20 NOTE — Telephone Encounter (Signed)
Fax Received. Refill Completed. Itzell Bendavid Chowoe (R.M.A)   

## 2013-05-13 ENCOUNTER — Telehealth: Payer: Self-pay | Admitting: *Deleted

## 2013-05-13 NOTE — Telephone Encounter (Signed)
S/w pt's husband am not sure what does of metoprolol er ( 100 mg ) pt is taking the chart says (100 mg ) daily and the paper script say ( 100 mg ) one tablet in the am and one half in the pm pt will call back tomorrow

## 2013-05-14 ENCOUNTER — Ambulatory Visit (INDEPENDENT_AMBULATORY_CARE_PROVIDER_SITE_OTHER): Payer: Medicare Other | Admitting: Surgery

## 2013-05-14 ENCOUNTER — Encounter (INDEPENDENT_AMBULATORY_CARE_PROVIDER_SITE_OTHER): Payer: Self-pay | Admitting: Surgery

## 2013-05-14 ENCOUNTER — Telehealth: Payer: Self-pay | Admitting: Internal Medicine

## 2013-05-14 VITALS — BP 132/78 | HR 78 | Temp 97.1°F | Resp 12 | Ht 64.0 in | Wt 171.5 lb

## 2013-05-14 DIAGNOSIS — N6019 Diffuse cystic mastopathy of unspecified breast: Secondary | ICD-10-CM

## 2013-05-14 MED ORDER — METOPROLOL SUCCINATE ER 100 MG PO TB24: 100.0000 mg | ORAL_TABLET | Freq: Every day | ORAL | Status: DC

## 2013-05-14 NOTE — Telephone Encounter (Signed)
S/w pt confirming dose of metoprolol (100 mg ) daily sent into [facility] today

## 2013-05-14 NOTE — Progress Notes (Signed)
  CC: Fibrocystic breast followup HPI: The patient has had a chronic history fibrocystic changes in the breast. She has dense breasts and has had bilateral reductions. She comes in for routine followup. She has had no symptoms develop.   ROS: No changes no problems    PE VS: BP 132/78  Pulse 78  Temp(Src) 97.1 F (36.2 C) (Oral)  Resp 12  Ht 5\' 4"  (1.626 m)  Wt 171 lb 8 oz (77.792 kg)  BMI 29.42 kg/m2  GENERAL:  The patient is alert, oriented, and generally healthy-appearing, NAD. Mood and affect are normal.   BREASTS:  Well-healed surgical scars from her reduction. Her cardiac pacemaker is noted in the left upper inner quadrant. There are no suspicious areas and she is not tender.  LYMPHATICS: She is now axillary or supraclavicular adenopathy on either side.   Data Reviewed Mammogram done 11/05: IMPRESSION:  No mammographic evidence of malignancy.  A result letter of this screening mammogram will be mailed directly  to the patient.  RECOMMENDATION:  Screening mammogram in one year. (Code:SM-B-01Y)  BI-RADS CATEGORY 1: Negative.    Assessment Stable exam was noted. There are no suspicious changes. Continued dense breast tissue..  Plan She wants to continue follow up here so will see her in six months, after her next mammogram

## 2013-05-14 NOTE — Telephone Encounter (Signed)
Follow up  Pt states she is returning [place] call. She said that she will be available after 1 pm. She said you can try her on her home phone and cell.

## 2013-05-14 NOTE — Patient Instructions (Signed)
Continue annual mammogram and see me in six months, after your next mammogram

## 2013-05-17 ENCOUNTER — Other Ambulatory Visit: Payer: Self-pay | Admitting: *Deleted

## 2013-05-17 NOTE — Telephone Encounter (Signed)
Called pt to verify sig and she states she's taking 1 tablet QD of her Metoprolol. Pharmacy aware.

## 2013-06-02 DIAGNOSIS — M899 Disorder of bone, unspecified: Secondary | ICD-10-CM | POA: Diagnosis not present

## 2013-06-02 DIAGNOSIS — M949 Disorder of cartilage, unspecified: Secondary | ICD-10-CM | POA: Diagnosis not present

## 2013-06-02 DIAGNOSIS — R7301 Impaired fasting glucose: Secondary | ICD-10-CM | POA: Diagnosis not present

## 2013-06-02 DIAGNOSIS — Z0000 Encounter for general adult medical examination without abnormal findings: Secondary | ICD-10-CM | POA: Diagnosis not present

## 2013-06-07 ENCOUNTER — Encounter (INDEPENDENT_AMBULATORY_CARE_PROVIDER_SITE_OTHER): Payer: Medicare Other

## 2013-06-07 ENCOUNTER — Ambulatory Visit (INDEPENDENT_AMBULATORY_CARE_PROVIDER_SITE_OTHER): Payer: Medicare Other | Admitting: Internal Medicine

## 2013-06-07 ENCOUNTER — Telehealth: Payer: Self-pay | Admitting: Nurse Practitioner

## 2013-06-07 ENCOUNTER — Encounter: Payer: Self-pay | Admitting: Internal Medicine

## 2013-06-07 VITALS — BP 150/80 | HR 89 | Ht 64.0 in | Wt 171.8 lb

## 2013-06-07 DIAGNOSIS — M79609 Pain in unspecified limb: Secondary | ICD-10-CM

## 2013-06-07 DIAGNOSIS — R609 Edema, unspecified: Secondary | ICD-10-CM | POA: Diagnosis not present

## 2013-06-07 DIAGNOSIS — I4891 Unspecified atrial fibrillation: Secondary | ICD-10-CM | POA: Diagnosis not present

## 2013-06-07 DIAGNOSIS — I4949 Other premature depolarization: Secondary | ICD-10-CM | POA: Diagnosis not present

## 2013-06-07 DIAGNOSIS — M7989 Other specified soft tissue disorders: Secondary | ICD-10-CM

## 2013-06-07 DIAGNOSIS — M79669 Pain in unspecified lower leg: Secondary | ICD-10-CM | POA: Insufficient documentation

## 2013-06-07 DIAGNOSIS — M79662 Pain in left lower leg: Secondary | ICD-10-CM

## 2013-06-07 DIAGNOSIS — R600 Localized edema: Secondary | ICD-10-CM

## 2013-06-07 DIAGNOSIS — I493 Ventricular premature depolarization: Secondary | ICD-10-CM

## 2013-06-07 LAB — PACEMAKER DEVICE OBSERVATION

## 2013-06-07 MED ORDER — VERAPAMIL HCL ER 120 MG PO TBCR: 120.0000 mg | EXTENDED_RELEASE_TABLET | Freq: Every day | ORAL | Status: DC

## 2013-06-07 NOTE — Assessment & Plan Note (Signed)
There is a mild left-sided fullness. We will check an venous Doppler although the likelihood should be exceedingly low in her apixaban

## 2013-06-07 NOTE — Assessment & Plan Note (Signed)
Blood pressure low at elevated. We'll be making adjustments as above

## 2013-06-07 NOTE — Assessment & Plan Note (Signed)
No further atrial fibrillation on the monitor. She does have nocturnal palpitations. And we will stop her amlodipine and begin her on verapamil 120 to be taken at night.

## 2013-06-07 NOTE — Telephone Encounter (Signed)
Pharmacist from [facility] called.  Patient was in the store with a question regarding what medications to stop and start.  I read Dr. Odessa Fleming discharge instructions to stop Amlodipine and start Verapamil to him and he verbalized understanding.

## 2013-06-07 NOTE — Assessment & Plan Note (Signed)
The patient's device was interrogated.  The information was reviewed. No changes were made in the programming.   a

## 2013-06-07 NOTE — Progress Notes (Signed)
Patient Care Team: Gwen Pounds, MD as PCP - General (Internal Medicine)   HPI  Amber [name] is a 67 y.o. female Seen in followup for atrial fibrillation identified as an implantable loop recorder. Her most recent visit she was started on apixaban.  She is having nocturnal symptoms. She has been treated with a beta blocker  She has complaints of swelling and tenderness of her left calf. Electrocardiogram has a new S1Q 3 T3   Past Medical History  Diagnosis Date  . Chest pain     + Tn s/p cath 2012--Normal  . SOB (shortness of breath)   . HTN (hypertension)   . Palpitations     three types  . 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 Obesity   . History of tobacco abuse   . Post-menopausal   . Chest tightness   . Dizziness   . Right bundle branch block   . Hyperlipemia   . Abdominal mass     Pulsating  . Arthritis   . Osteoporosis     Past Surgical History  Procedure Laterality Date  . Breast reduction surgery      BILATERAL  . Carpal tunnel release    . Tonsillectomy and adenoidectomy  AGE 58  . Ovarian cyst removal  1978  . Cardiac catheterization  02-25-2011    EF 60%. Left coronary artery arises and distributes normal, right coronary artery is a dominant vessel and is normal  . US echocardiography  12-23-2006    EF 55-60%  . Cardiovascular stress test  08-27-2002    EF 76%  . Breast biopsy      left breast- benign    Current Outpatient Prescriptions  Medication Sig Dispense Refill  . amLODipine (NORVASC) 5 MG tablet Take 1 tablet (5 mg total) by mouth daily.  30 tablet  5  . apixaban (ELIQUIS) 5 MG TABS tablet Take 1 tablet (5 mg total) by mouth 2 (two) times daily.  60 tablet  6  . Cholecalciferol (VITAMIN D PO) Take by mouth daily.        Marland Kitchen esomeprazole (NEXIUM) 40 MG capsule Take 40 mg by mouth as needed.        . fish oil-omega-3 fatty acids 1000 MG capsule Take 2 g by mouth daily.        Marland Kitchen losartan (COZAAR) 100 MG tablet Take 100 mg by mouth daily.        .  metoprolol succinate (TOPROL-XL) 100 MG 24 hr tablet Take 1 tablet (100 mg total) by mouth daily.  90 tablet  3  . propranolol (INDERAL) 10 MG tablet Take 1 tablet (10 mg total) by mouth 4 (four) times daily as needed. As needed for palpitations  100 tablet  1  . sertraline (ZOLOFT) 25 MG tablet Take 25 mg by mouth daily.        No current facility-administered medications for this visit.    Allergies  Allergen Reactions  . Ciprofloxacin Itching and Swelling    Review of Systems negative except from HPI and PMH  Physical Exam BP 150/80  Pulse 89  Ht 5\' 4"  (1.626 m)  Wt 171 lb 12.8 oz (77.928 kg)  BMI 29.47 kg/m2 Well developed and well nourished in no acute distress HENT normal E scleral and icterus clear Neck Supple JVP flat; carotids brisk and full Clear to ausculation  Regular rate and rhythm, no murmurs gallops or rub Soft with active bowel sounds No clubbing cyanosis  Trace Edema  Fullness to the left calf with some overlying erythema Alert and oriented, grossly normal motor and sensory function Skin Warm and Dry ECG demonstrates sinus rhythm at 89 Intervals 14/09/36 RSR prime in lead V1 S!Q3T3-the latter new from 5/13   Assessment and  Plan

## 2013-06-07 NOTE — Patient Instructions (Addendum)
Your physician has recommended you make the following change in your medication:  1) Stop norvasc (amlodipine) 2) Start verapamil 120 mg one tablet by mouth once daily.  Your physician has requested that you have a lower extremity venous duplex. This test is an ultrasound of the veins in the legs. It looks at venous blood flow that carries blood from the heart to the legs. Allow one hour for a Lower Venous exam. There are no restrictions or special instructions.  Your physician recommends that you schedule a follow-up appointment in: 3 months with the device clinic.  Your physician wants you to follow-up in: 1 year with Dr. Graciela Husbands. You will receive a reminder letter in the mail two months in advance. If you don't receive a letter, please call our office to schedule the follow-up appointment.

## 2013-06-08 DIAGNOSIS — M899 Disorder of bone, unspecified: Secondary | ICD-10-CM | POA: Diagnosis not present

## 2013-06-08 DIAGNOSIS — M949 Disorder of cartilage, unspecified: Secondary | ICD-10-CM | POA: Diagnosis not present

## 2013-06-08 DIAGNOSIS — I4891 Unspecified atrial fibrillation: Secondary | ICD-10-CM | POA: Diagnosis not present

## 2013-06-08 DIAGNOSIS — R51 Headache: Secondary | ICD-10-CM | POA: Diagnosis not present

## 2013-06-08 DIAGNOSIS — K589 Irritable bowel syndrome without diarrhea: Secondary | ICD-10-CM | POA: Diagnosis not present

## 2013-06-08 DIAGNOSIS — R609 Edema, unspecified: Secondary | ICD-10-CM | POA: Diagnosis not present

## 2013-06-08 DIAGNOSIS — J309 Allergic rhinitis, unspecified: Secondary | ICD-10-CM | POA: Diagnosis not present

## 2013-06-08 DIAGNOSIS — R259 Unspecified abnormal involuntary movements: Secondary | ICD-10-CM | POA: Diagnosis not present

## 2013-06-08 DIAGNOSIS — Z1331 Encounter for screening for depression: Secondary | ICD-10-CM | POA: Diagnosis not present

## 2013-06-08 DIAGNOSIS — Z0000 Encounter for general adult medical examination without abnormal findings: Secondary | ICD-10-CM | POA: Diagnosis not present

## 2013-07-13 DIAGNOSIS — M84369A Stress fracture, unspecified tibia and fibula, initial encounter for fracture: Secondary | ICD-10-CM | POA: Diagnosis not present

## 2013-07-13 DIAGNOSIS — Z01419 Encounter for gynecological examination (general) (routine) without abnormal findings: Secondary | ICD-10-CM | POA: Diagnosis not present

## 2013-07-13 DIAGNOSIS — Z124 Encounter for screening for malignant neoplasm of cervix: Secondary | ICD-10-CM | POA: Diagnosis not present

## 2013-07-15 ENCOUNTER — Ambulatory Visit (INDEPENDENT_AMBULATORY_CARE_PROVIDER_SITE_OTHER): Payer: Medicare Other | Admitting: *Deleted

## 2013-07-15 ENCOUNTER — Encounter: Payer: Self-pay | Admitting: Internal Medicine

## 2013-07-15 ENCOUNTER — Telehealth: Payer: Self-pay | Admitting: Internal Medicine

## 2013-07-15 DIAGNOSIS — I4891 Unspecified atrial fibrillation: Secondary | ICD-10-CM

## 2013-07-15 NOTE — Progress Notes (Signed)
ILR interrogation in office. 

## 2013-07-15 NOTE — Telephone Encounter (Signed)
Spoke with patient who c/o episode of very rapid and irregular heart rate lasting approximately 20 minutes last night.  Patient has a [employer] loop recorder.  Patient states she did not pass out and did not feel like she was going to pass out.  Patient states she took an Inderal 10 mg and also took a Xanax from a Rx she had for airplane travel.  Patient states she went to sleep and did not wake up until this morning.  Patient states she feels fine now but would like to know if she needs to come in.   I spoke with Keenan Bachelor in device clinic who advised that she will make patient an appointment for 2:30 today as patient is unable to transmit from home.  I advised patient who verbalized understanding and agreement with plan of care.

## 2013-07-15 NOTE — Telephone Encounter (Signed)
New Prob     States she had an episode of fast HR lastnight and would like to speak to the nurse regarding this. Please call.

## 2013-07-21 ENCOUNTER — Other Ambulatory Visit: Payer: Self-pay

## 2013-07-26 DIAGNOSIS — S8263XA Displaced fracture of lateral malleolus of unspecified fibula, initial encounter for closed fracture: Secondary | ICD-10-CM | POA: Diagnosis not present

## 2013-08-06 DIAGNOSIS — L719 Rosacea, unspecified: Secondary | ICD-10-CM | POA: Diagnosis not present

## 2013-08-06 DIAGNOSIS — IMO0002 Reserved for concepts with insufficient information to code with codable children: Secondary | ICD-10-CM | POA: Diagnosis not present

## 2013-08-13 DIAGNOSIS — S8263XA Displaced fracture of lateral malleolus of unspecified fibula, initial encounter for closed fracture: Secondary | ICD-10-CM | POA: Diagnosis not present

## 2013-08-25 DIAGNOSIS — N9089 Other specified noninflammatory disorders of vulva and perineum: Secondary | ICD-10-CM | POA: Diagnosis not present

## 2013-09-06 ENCOUNTER — Telehealth: Payer: Self-pay | Admitting: Internal Medicine

## 2013-09-06 NOTE — Telephone Encounter (Signed)
Pt has loop recorder and should be fine with MRI.

## 2013-09-06 NOTE — Telephone Encounter (Signed)
Pt wants to know if pt can have MRI

## 2013-09-07 DIAGNOSIS — Z1212 Encounter for screening for malignant neoplasm of rectum: Secondary | ICD-10-CM | POA: Diagnosis not present

## 2013-09-28 ENCOUNTER — Other Ambulatory Visit: Payer: Self-pay

## 2013-09-28 DIAGNOSIS — Z1231 Encounter for screening mammogram for malignant neoplasm of breast: Secondary | ICD-10-CM

## 2013-10-14 ENCOUNTER — Ambulatory Visit (INDEPENDENT_AMBULATORY_CARE_PROVIDER_SITE_OTHER): Payer: Medicare Other | Admitting: *Deleted

## 2013-10-14 DIAGNOSIS — I4891 Unspecified atrial fibrillation: Secondary | ICD-10-CM

## 2013-10-14 NOTE — Progress Notes (Signed)
Loop check in clinic.  Pt with 1 tachy episodes; 1 brady episodes; 60 asystole, all of which were either undersensing or noise.  5 patient acitvated episodes show PAC's, A-fib with rates of 140bpm.   ROV in February with the device clinic.

## 2013-10-21 ENCOUNTER — Other Ambulatory Visit: Payer: Self-pay

## 2013-10-22 ENCOUNTER — Ambulatory Visit
Admission: RE | Admit: 2013-10-22 | Discharge: 2013-10-22 | Disposition: A | Discharge status: Home / Self Care | Payer: Medicare Other | Source: Ambulatory Visit

## 2013-10-22 DIAGNOSIS — Z1231 Encounter for screening mammogram for malignant neoplasm of breast: Secondary | ICD-10-CM

## 2013-10-25 ENCOUNTER — Encounter: Payer: Self-pay | Admitting: Internal Medicine

## 2013-10-26 ENCOUNTER — Ambulatory Visit (INDEPENDENT_AMBULATORY_CARE_PROVIDER_SITE_OTHER): Payer: Medicare Other | Admitting: Surgery

## 2013-10-26 ENCOUNTER — Encounter (INDEPENDENT_AMBULATORY_CARE_PROVIDER_SITE_OTHER): Payer: Self-pay | Admitting: Surgery

## 2013-10-26 VITALS — BP 128/74 | HR 70 | Temp 98.0°F | Resp 18 | Ht 65.0 in | Wt 177.0 lb

## 2013-10-26 DIAGNOSIS — N6019 Diffuse cystic mastopathy of unspecified breast: Secondary | ICD-10-CM | POA: Diagnosis not present

## 2013-10-26 NOTE — Patient Instructions (Signed)
Continue annual mammograms. Dr Johna Sheriff can see you next week

## 2013-10-26 NOTE — Progress Notes (Signed)
  CC: Fibrocystic breast followup HPI: The patient has had a chronic history fibrocystic changes in the breast. She has dense breasts and has had bilateral reductions. She comes in for routine followup. She has had no symptoms develop.   ROS: No changes no problems    PE VS: BP 128/74  Pulse 70  Temp(Src) 98 F (36.7 C)  Resp 18  Ht 5\' 5"  (1.651 m)  Wt 177 lb (80.287 kg)  BMI 29.45 kg/m2  GENERAL:  The patient is alert, oriented, and generally healthy-appearing, NAD. Mood and affect are normal.   BREASTS:  Well-healed surgical scars from her reduction. Her cardiac device is noted in the left upper inner quadrant. There are no suspicious areas and she is not tender.  LYMPHATICS: She is now axillary or supraclavicular adenopathy on either side.   Data Reviewed EXAM:  DIGITAL SCREENING BILATERAL MAMMOGRAM WITH CAD  DIGITAL BREAST TOMOSYNTHESIS  Digital breast tomosynthesis images are acquired in two projections.  These images are reviewed in combination with the digital mammogram,  confirming the findings below. A  COMPARISON: Previous exam(s).  ACR Breast Density Category c: The breasts are heterogeneously  dense, which may obscure small masses.  FINDINGS:  There are no findings suspicious for malignancy. Images were  processed with CAD.  IMPRESSION:  No mammographic evidence of malignancy. A result letter of this  screening mammogram will be mailed directly to the patient.  RECOMMENDATION:  Screening mammogram in one year. (Code:SM-B-01Y)  BI-RADS CATEGORY 1: Negative  Electronically Signed  By: Britta Mccreedy M.D.  On: 10/22/2013 15:48    Assessment Stable exam was noted. There are no suspicious changes. Continued dense breast tissue..  Plan She wants to continue follow up here so will see her in one year - Dr Johna Sheriff

## 2013-11-18 ENCOUNTER — Other Ambulatory Visit: Payer: Self-pay | Admitting: Internal Medicine

## 2013-11-19 ENCOUNTER — Other Ambulatory Visit: Payer: Self-pay

## 2013-11-19 MED ORDER — APIXABAN 5 MG PO TABS: 5.0000 mg | ORAL_TABLET | Freq: Two times a day (BID) | ORAL | Status: DC

## 2013-11-24 DIAGNOSIS — H40059 Ocular hypertension, unspecified eye: Secondary | ICD-10-CM | POA: Diagnosis not present

## 2013-11-24 DIAGNOSIS — H5200 Hypermetropia, unspecified eye: Secondary | ICD-10-CM | POA: Diagnosis not present

## 2013-11-24 DIAGNOSIS — H52209 Unspecified astigmatism, unspecified eye: Secondary | ICD-10-CM | POA: Diagnosis not present

## 2013-11-24 DIAGNOSIS — H2510 Age-related nuclear cataract, unspecified eye: Secondary | ICD-10-CM | POA: Diagnosis not present

## 2013-11-29 DIAGNOSIS — L719 Rosacea, unspecified: Secondary | ICD-10-CM | POA: Diagnosis not present

## 2013-11-29 DIAGNOSIS — D239 Other benign neoplasm of skin, unspecified: Secondary | ICD-10-CM | POA: Diagnosis not present

## 2013-11-29 DIAGNOSIS — L821 Other seborrheic keratosis: Secondary | ICD-10-CM | POA: Diagnosis not present

## 2014-01-17 ENCOUNTER — Ambulatory Visit (INDEPENDENT_AMBULATORY_CARE_PROVIDER_SITE_OTHER): Payer: Medicare Other | Admitting: *Deleted

## 2014-01-17 DIAGNOSIS — I4891 Unspecified atrial fibrillation: Secondary | ICD-10-CM | POA: Diagnosis not present

## 2014-01-17 DIAGNOSIS — I4949 Other premature depolarization: Secondary | ICD-10-CM

## 2014-01-17 DIAGNOSIS — I493 Ventricular premature depolarization: Secondary | ICD-10-CM

## 2014-01-17 LAB — MDC_IDC_ENUM_SESS_TYPE_INCLINIC
Implantable Pulse Generator Serial Number: 2587919
MDC IDC MSMT BATTERY REMAINING LONGEVITY: 9 mo

## 2014-01-17 NOTE — Progress Notes (Signed)
Loop check in clinic.  Pt with 0 tachy episodes; 0 brady episodes; 21 asystole episodes---undersensing. 4 "symptom" episodes recorded for palps---PACs, sinus arrhythmia, and ?AF + Eliquis. Plan to follow up with SK in June 2015.

## 2014-01-31 ENCOUNTER — Encounter: Payer: Self-pay | Admitting: Internal Medicine

## 2014-01-31 DIAGNOSIS — E669 Obesity, unspecified: Secondary | ICD-10-CM | POA: Diagnosis not present

## 2014-01-31 DIAGNOSIS — I4891 Unspecified atrial fibrillation: Secondary | ICD-10-CM | POA: Diagnosis not present

## 2014-01-31 DIAGNOSIS — Z23 Encounter for immunization: Secondary | ICD-10-CM | POA: Diagnosis not present

## 2014-01-31 DIAGNOSIS — K219 Gastro-esophageal reflux disease without esophagitis: Secondary | ICD-10-CM | POA: Diagnosis not present

## 2014-01-31 DIAGNOSIS — D72819 Decreased white blood cell count, unspecified: Secondary | ICD-10-CM | POA: Diagnosis not present

## 2014-01-31 DIAGNOSIS — E785 Hyperlipidemia, unspecified: Secondary | ICD-10-CM | POA: Diagnosis not present

## 2014-01-31 DIAGNOSIS — M545 Low back pain, unspecified: Secondary | ICD-10-CM | POA: Diagnosis not present

## 2014-01-31 DIAGNOSIS — I10 Essential (primary) hypertension: Secondary | ICD-10-CM | POA: Diagnosis not present

## 2014-01-31 DIAGNOSIS — Z1331 Encounter for screening for depression: Secondary | ICD-10-CM | POA: Diagnosis not present

## 2014-01-31 DIAGNOSIS — R7301 Impaired fasting glucose: Secondary | ICD-10-CM | POA: Diagnosis not present

## 2014-04-23 ENCOUNTER — Other Ambulatory Visit: Payer: Self-pay | Admitting: Internal Medicine

## 2014-05-12 ENCOUNTER — Telehealth: Payer: Self-pay | Admitting: Internal Medicine

## 2014-05-12 ENCOUNTER — Encounter: Payer: Self-pay | Admitting: Internal Medicine

## 2014-05-12 ENCOUNTER — Other Ambulatory Visit: Payer: Self-pay | Admitting: Internal Medicine

## 2014-05-12 ENCOUNTER — Ambulatory Visit (INDEPENDENT_AMBULATORY_CARE_PROVIDER_SITE_OTHER): Payer: Medicare Other | Admitting: Internal Medicine

## 2014-05-12 VITALS — BP 195/100 | HR 98 | Ht 64.5 in | Wt 181.0 lb

## 2014-05-12 DIAGNOSIS — Z959 Presence of cardiac and vascular implant and graft, unspecified: Secondary | ICD-10-CM | POA: Diagnosis not present

## 2014-05-12 DIAGNOSIS — I10 Essential (primary) hypertension: Secondary | ICD-10-CM | POA: Diagnosis not present

## 2014-05-12 DIAGNOSIS — I4891 Unspecified atrial fibrillation: Secondary | ICD-10-CM | POA: Diagnosis not present

## 2014-05-12 LAB — MDC_IDC_ENUM_SESS_TYPE_INCLINIC: MDC IDC PG SERIAL: 2587919

## 2014-05-12 MED ORDER — CLONIDINE HCL 0.1 MG PO TABS
0.1000 mg | ORAL_TABLET | Freq: Once | ORAL | Status: AC
  Administered 2014-05-12: 0.1 mg via ORAL

## 2014-05-12 MED ORDER — AMLODIPINE BESYLATE 10 MG PO TABS: 10.0000 mg | ORAL_TABLET | Freq: Every day | ORAL | Status: DC

## 2014-05-12 NOTE — Telephone Encounter (Signed)
New message          Pt would like to know the next time she will come to the office to have loop recorder downloaded

## 2014-05-12 NOTE — Telephone Encounter (Signed)
Pt scheduled for loop check on 08-15-14.

## 2014-05-12 NOTE — Progress Notes (Signed)
Patient Care Team: Precious Reel, MD as PCP - General (Internal Medicine)   HPI  Amber [name] is a 68 y.o. female Seen in followup for atrial fibrillation. She is on apixaban.  She's having some problems with dyspnea on exertion. She has not noticed his in the pool. She is confined to water aerobics because of her back.  She continues with palpitations.  She has gained a significant amount of weight which correlates with both having her her leg and had been started on verapamil  Past Medical History  Diagnosis Date  . Chest pain     + Tn s/p cath 2012--Normal  . SOB (shortness of breath)   . HTN (hypertension)   . Palpitations     three types  . 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 Obesity   . History of tobacco abuse   . Post-menopausal   . Chest tightness   . Dizziness   . Right bundle branch block   . Hyperlipemia   . Abdominal mass     Pulsating  . Arthritis   . Osteoporosis     Past Surgical History  Procedure Laterality Date  . Breast reduction surgery      BILATERAL  . Carpal tunnel release    . Tonsillectomy and adenoidectomy  AGE 67  . Ovarian cyst removal  1978  . Cardiac catheterization  02-25-2011    EF 60%. Left coronary artery arises and distributes normal, right coronary artery is a dominant vessel and is normal  . US echocardiography  12-23-2006    EF 55-60%  . Cardiovascular stress test  08-27-2002    EF 76%  . Breast biopsy      left breast- benign    Current Outpatient Prescriptions  Medication Sig Dispense Refill  . apixaban (ELIQUIS) 5 MG TABS tablet Take 1 tablet (5 mg total) by mouth 2 (two) times daily.  60 tablet  6  . Cholecalciferol (VITAMIN D PO) Take by mouth daily.        Marland Kitchen esomeprazole (NEXIUM) 40 MG capsule Take 40 mg by mouth as needed.        . fish oil-omega-3 fatty acids 1000 MG capsule Take 2 g by mouth daily.        Marland Kitchen losartan (COZAAR) 100 MG tablet Take 100 mg by mouth daily.        . metoprolol succinate  (TOPROL-XL) 100 MG 24 hr tablet TAKE 1 TABLET BY MOUTH DAILY  90 tablet  0  . propranolol (INDERAL) 10 MG tablet Take one tablet as needed for palpitations.      . rosuvastatin (CRESTOR) 5 MG tablet Take 5 mg by mouth 3 (three) times a week.      . sertraline (ZOLOFT) 50 MG tablet Take 50 mg by mouth daily.      . verapamil (CALAN-SR) 120 MG CR tablet Take 1 tablet (120 mg total) by mouth at bedtime.  30 tablet  11   No current facility-administered medications for this visit.    Allergies  Allergen Reactions  . Ciprofloxacin Itching and Swelling    Review of Systems negative except from HPI and PMH  Physical Exam BP 162/80  Pulse 98  Ht 5' 4.5" (1.638 m)  Wt 181 lb (82.101 kg)  BMI 30.60 kg/m2 Well developed and well nourished in no acute distress HENT normal E scleral and icterus clear Neck Supple JVP flat; carotids brisk and full Clear to  ausculation  Regular rate and rhythm, no murmurs gallops or rub Soft with active bowel sounds No clubbing cyanosis  Edema Alert and oriented, grossly normal motor and sensory function Skin Warm and Dry  Recorder demonstrates episodes of atrial tachycardia with block   Assessment and  Plan  Hypertensive urgency  Atrial tachycardia  Dyspnea on exertion  Obesity  Her blood pressure is quite elevated today. We will give her clonidine prior to her leaving the office. If her impression that was better on amlodipine and her palpitations are not all that much better on verapamil. Hence we'll discontinue the verapamil and resume amlodipine and we'll started her on 10 mg. she is to have blood work done next week. He will be important to check her TSH.  With her increasing dyspnea and significant hypertension we will repeat echocardiogram. In the event that we see diastolic dysfunction a low-dose diuretic may help with symptoms.

## 2014-05-12 NOTE — Patient Instructions (Signed)
Your physician has recommended you make the following change in your medication:  1) START Amlodipine 10 mg daily 2) STOP Verapamil  Your physician has requested that you have an echocardiogram. Echocardiography is a painless test that uses sound waves to create images of your heart. It provides your doctor with information about the size and shape of your heart and how well your heart's chambers and valves are working. This procedure takes approximately one hour. There are no restrictions for this procedure.  Your physician wants you to follow-up in: 6 months with Dr. Caryl Comes. You will receive a reminder letter in the mail two months in advance. If you don't receive a letter, please call our office to schedule the follow-up appointment.

## 2014-05-17 ENCOUNTER — Telehealth: Payer: Self-pay | Admitting: Internal Medicine

## 2014-05-17 NOTE — Telephone Encounter (Signed)
New message     Blood pressure is still high.  Doctor mentioned putting her on a medication with HCTZ in it.  She want it called in to [facility] in [place], [place].

## 2014-05-18 ENCOUNTER — Other Ambulatory Visit: Payer: Self-pay | Admitting: Internal Medicine

## 2014-05-18 ENCOUNTER — Other Ambulatory Visit: Payer: Self-pay | Admitting: *Deleted

## 2014-05-18 MED ORDER — HYDROCHLOROTHIAZIDE 25 MG PO TABS: 25.0000 mg | ORAL_TABLET | Freq: Every day | ORAL | Status: DC

## 2014-05-18 NOTE — Telephone Encounter (Signed)
Explained Dr Olin Pia note from office visit: With her increasing dyspnea and significant hypertension we will repeat echocardiogram. In the event that we see diastolic dysfunction a low-dose diuretic may help with symptoms.  She reports BPs still elevated in 150/160s. Will review with Dr. Caryl Comes and call her back with any recommendations. She is agreeable to this.

## 2014-05-18 NOTE — Telephone Encounter (Addendum)
Will go ahead and start diuretic to see if BP improves. Starting HCTZ 25 mg daily.  (If this is therapeutic and tolerated by the patient, then we will change her over to Hyzaar)  Rx sent to [facility] in [place], [place] per pt request. Patient verbalized understanding and agreeable to plan.

## 2014-06-02 DIAGNOSIS — E785 Hyperlipidemia, unspecified: Secondary | ICD-10-CM | POA: Diagnosis not present

## 2014-06-02 DIAGNOSIS — I10 Essential (primary) hypertension: Secondary | ICD-10-CM | POA: Diagnosis not present

## 2014-06-02 DIAGNOSIS — Z0000 Encounter for general adult medical examination without abnormal findings: Secondary | ICD-10-CM | POA: Diagnosis not present

## 2014-06-02 DIAGNOSIS — M899 Disorder of bone, unspecified: Secondary | ICD-10-CM | POA: Diagnosis not present

## 2014-06-02 DIAGNOSIS — R7301 Impaired fasting glucose: Secondary | ICD-10-CM | POA: Diagnosis not present

## 2014-06-08 ENCOUNTER — Other Ambulatory Visit: Payer: Self-pay | Admitting: Internal Medicine

## 2014-06-13 ENCOUNTER — Ambulatory Visit (HOSPITAL_COMMUNITY): Payer: Medicare Other | Attending: Cardiovascular Disease | Admitting: Radiology

## 2014-06-13 ENCOUNTER — Other Ambulatory Visit (HOSPITAL_COMMUNITY): Payer: Self-pay | Admitting: Internal Medicine

## 2014-06-13 DIAGNOSIS — R82998 Other abnormal findings in urine: Secondary | ICD-10-CM | POA: Diagnosis not present

## 2014-06-13 DIAGNOSIS — R0602 Shortness of breath: Secondary | ICD-10-CM

## 2014-06-13 DIAGNOSIS — E871 Hypo-osmolality and hyponatremia: Secondary | ICD-10-CM | POA: Diagnosis not present

## 2014-06-13 DIAGNOSIS — I480 Paroxysmal atrial fibrillation: Secondary | ICD-10-CM

## 2014-06-13 DIAGNOSIS — Z0000 Encounter for general adult medical examination without abnormal findings: Secondary | ICD-10-CM | POA: Diagnosis not present

## 2014-06-13 DIAGNOSIS — I10 Essential (primary) hypertension: Secondary | ICD-10-CM | POA: Diagnosis not present

## 2014-06-13 DIAGNOSIS — E785 Hyperlipidemia, unspecified: Secondary | ICD-10-CM | POA: Diagnosis not present

## 2014-06-13 DIAGNOSIS — R7301 Impaired fasting glucose: Secondary | ICD-10-CM | POA: Diagnosis not present

## 2014-06-13 DIAGNOSIS — I4891 Unspecified atrial fibrillation: Secondary | ICD-10-CM | POA: Diagnosis not present

## 2014-06-13 DIAGNOSIS — R809 Proteinuria, unspecified: Secondary | ICD-10-CM | POA: Diagnosis not present

## 2014-06-13 DIAGNOSIS — D72819 Decreased white blood cell count, unspecified: Secondary | ICD-10-CM | POA: Diagnosis not present

## 2014-06-13 DIAGNOSIS — E876 Hypokalemia: Secondary | ICD-10-CM | POA: Diagnosis not present

## 2014-06-13 NOTE — Progress Notes (Signed)
Echocardiogram performed.  

## 2014-06-21 ENCOUNTER — Encounter: Payer: Self-pay | Admitting: Internal Medicine

## 2014-06-21 DIAGNOSIS — M25559 Pain in unspecified hip: Secondary | ICD-10-CM | POA: Diagnosis not present

## 2014-06-21 NOTE — Telephone Encounter (Signed)
New message ° ° ° ° ° °Want echo results °

## 2014-06-22 NOTE — Telephone Encounter (Signed)
This encounter was created in error - please disregard.

## 2014-06-23 ENCOUNTER — Encounter: Payer: Self-pay | Admitting: Internal Medicine

## 2014-06-23 ENCOUNTER — Telehealth: Payer: Self-pay | Admitting: *Deleted

## 2014-06-23 NOTE — Telephone Encounter (Signed)
Patient is returning your call. Please call back @ 3: 30 or after.

## 2014-06-23 NOTE — Telephone Encounter (Signed)
This encounter was created in error - please disregard.

## 2014-06-23 NOTE — Telephone Encounter (Signed)
removed Verapamil per [name] request. added k to list per [name] request.

## 2014-06-27 ENCOUNTER — Encounter: Payer: Self-pay | Admitting: Internal Medicine

## 2014-06-28 ENCOUNTER — Telehealth: Payer: Self-pay | Admitting: Internal Medicine

## 2014-06-28 MED ORDER — FUROSEMIDE 20 MG PO TABS: 20.0000 mg | ORAL_TABLET | Freq: Every day | ORAL | Status: DC

## 2014-06-28 MED ORDER — AMLODIPINE BESYLATE 10 MG PO TABS: 5.0000 mg | ORAL_TABLET | Freq: Every day | ORAL | Status: DC

## 2014-06-28 NOTE — Telephone Encounter (Signed)
Called patient to explain that I will be reviewing info with Dr. Caryl Comes when he is back in office this afternoon. I explained that he has not been in office since someone spoke with her last Thursday, and would be reviewing diuretic/med questions with him today. She is agreeable to this.

## 2014-06-28 NOTE — Telephone Encounter (Signed)
New message     Pt want echo results and have questions about her medications

## 2014-06-28 NOTE — Telephone Encounter (Addendum)
Informed patient Dr. Caryl Comes would like to stop HCTZ and start Lasix 20 mg daily -- she is already taking a potassium supplement. Instructed her to reduce Amlodipine to 5 mg daily. Prescription sent to [facility]/[place] for Lasix. Patient to have follow up labs in [place] beginning of August and will send [place] results. Patient verbalized understanding and agreeable to plan.

## 2014-07-15 DIAGNOSIS — E876 Hypokalemia: Secondary | ICD-10-CM | POA: Diagnosis not present

## 2014-08-01 ENCOUNTER — Ambulatory Visit (INDEPENDENT_AMBULATORY_CARE_PROVIDER_SITE_OTHER): Payer: Medicare Other | Admitting: *Deleted

## 2014-08-01 DIAGNOSIS — I4949 Other premature depolarization: Secondary | ICD-10-CM | POA: Diagnosis not present

## 2014-08-01 DIAGNOSIS — I493 Ventricular premature depolarization: Secondary | ICD-10-CM

## 2014-08-01 LAB — MDC_IDC_ENUM_SESS_TYPE_INCLINIC: Implantable Pulse Generator Serial Number: 2587919

## 2014-08-01 NOTE — Progress Notes (Signed)
Loop check in clinic.  Pt with 1 tachy episodes; 4 brady episodes; 52 asystole.     Episodes were undersensing.  7 patient activated episodes show PAC's.  The patient denies any syncopal episodes.  ROV in November with Dr. Caryl Comes.

## 2014-08-02 DIAGNOSIS — M76899 Other specified enthesopathies of unspecified lower limb, excluding foot: Secondary | ICD-10-CM | POA: Diagnosis not present

## 2014-08-04 ENCOUNTER — Encounter: Payer: Self-pay | Admitting: Internal Medicine

## 2014-08-05 ENCOUNTER — Other Ambulatory Visit: Payer: Self-pay

## 2014-08-05 ENCOUNTER — Other Ambulatory Visit: Payer: Self-pay | Admitting: Internal Medicine

## 2014-08-05 MED ORDER — PROPRANOLOL HCL 10 MG PO TABS: 10.0000 mg | ORAL_TABLET | Freq: Every day | ORAL | Status: DC | PRN

## 2014-08-10 ENCOUNTER — Encounter: Payer: Self-pay | Admitting: Internal Medicine

## 2014-08-12 DIAGNOSIS — Z1212 Encounter for screening for malignant neoplasm of rectum: Secondary | ICD-10-CM | POA: Diagnosis not present

## 2014-08-16 DIAGNOSIS — M25559 Pain in unspecified hip: Secondary | ICD-10-CM | POA: Diagnosis not present

## 2014-08-21 ENCOUNTER — Other Ambulatory Visit: Payer: Self-pay | Admitting: Internal Medicine

## 2014-08-26 DIAGNOSIS — M25559 Pain in unspecified hip: Secondary | ICD-10-CM | POA: Diagnosis not present

## 2014-09-01 DIAGNOSIS — M25559 Pain in unspecified hip: Secondary | ICD-10-CM | POA: Diagnosis not present

## 2014-09-12 DIAGNOSIS — Z124 Encounter for screening for malignant neoplasm of cervix: Secondary | ICD-10-CM | POA: Diagnosis not present

## 2014-09-12 DIAGNOSIS — Z01419 Encounter for gynecological examination (general) (routine) without abnormal findings: Secondary | ICD-10-CM | POA: Diagnosis not present

## 2014-09-16 ENCOUNTER — Other Ambulatory Visit: Payer: Self-pay

## 2014-09-16 DIAGNOSIS — Z1239 Encounter for other screening for malignant neoplasm of breast: Secondary | ICD-10-CM

## 2014-09-16 DIAGNOSIS — Z1231 Encounter for screening mammogram for malignant neoplasm of breast: Secondary | ICD-10-CM

## 2014-09-19 DIAGNOSIS — Z23 Encounter for immunization: Secondary | ICD-10-CM | POA: Diagnosis not present

## 2014-10-04 DIAGNOSIS — M1712 Unilateral primary osteoarthritis, left knee: Secondary | ICD-10-CM | POA: Diagnosis not present

## 2014-10-04 DIAGNOSIS — M2242 Chondromalacia patellae, left knee: Secondary | ICD-10-CM | POA: Diagnosis not present

## 2014-10-04 DIAGNOSIS — M7122 Synovial cyst of popliteal space [Baker], left knee: Secondary | ICD-10-CM | POA: Diagnosis not present

## 2014-10-04 DIAGNOSIS — M25562 Pain in left knee: Secondary | ICD-10-CM | POA: Diagnosis not present

## 2014-10-25 ENCOUNTER — Ambulatory Visit
Admission: RE | Admit: 2014-10-25 | Discharge: 2014-10-25 | Disposition: A | Discharge status: Home / Self Care | Payer: Medicare Other | Source: Ambulatory Visit

## 2014-10-25 DIAGNOSIS — M7122 Synovial cyst of popliteal space [Baker], left knee: Secondary | ICD-10-CM | POA: Diagnosis not present

## 2014-10-25 DIAGNOSIS — Z1231 Encounter for screening mammogram for malignant neoplasm of breast: Secondary | ICD-10-CM | POA: Diagnosis not present

## 2014-10-25 DIAGNOSIS — M2242 Chondromalacia patellae, left knee: Secondary | ICD-10-CM | POA: Diagnosis not present

## 2014-10-25 DIAGNOSIS — M25562 Pain in left knee: Secondary | ICD-10-CM | POA: Diagnosis not present

## 2014-10-25 DIAGNOSIS — M1712 Unilateral primary osteoarthritis, left knee: Secondary | ICD-10-CM | POA: Diagnosis not present

## 2014-11-08 ENCOUNTER — Encounter: Payer: Self-pay | Admitting: Internal Medicine

## 2014-11-08 ENCOUNTER — Ambulatory Visit (INDEPENDENT_AMBULATORY_CARE_PROVIDER_SITE_OTHER): Payer: Medicare Other | Admitting: Internal Medicine

## 2014-11-08 VITALS — BP 142/80 | HR 89 | Ht 64.0 in | Wt 174.2 lb

## 2014-11-08 DIAGNOSIS — I10 Essential (primary) hypertension: Secondary | ICD-10-CM | POA: Diagnosis not present

## 2014-11-08 DIAGNOSIS — R0609 Other forms of dyspnea: Secondary | ICD-10-CM

## 2014-11-08 DIAGNOSIS — R002 Palpitations: Secondary | ICD-10-CM

## 2014-11-08 DIAGNOSIS — I480 Paroxysmal atrial fibrillation: Secondary | ICD-10-CM | POA: Diagnosis not present

## 2014-11-08 DIAGNOSIS — R0600 Dyspnea, unspecified: Secondary | ICD-10-CM

## 2014-11-08 DIAGNOSIS — Z959 Presence of cardiac and vascular implant and graft, unspecified: Secondary | ICD-10-CM | POA: Diagnosis not present

## 2014-11-08 DIAGNOSIS — E669 Obesity, unspecified: Secondary | ICD-10-CM

## 2014-11-08 DIAGNOSIS — Z6829 Body mass index (BMI) 29.0-29.9, adult: Secondary | ICD-10-CM

## 2014-11-08 LAB — MDC_IDC_ENUM_SESS_TYPE_INCLINIC: Implantable Pulse Generator Serial Number: 2587919

## 2014-11-08 MED ORDER — AMLODIPINE BESYLATE 10 MG PO TABS: 10.0000 mg | ORAL_TABLET | Freq: Every day | ORAL | Status: DC

## 2014-11-08 MED ORDER — LOSARTAN POTASSIUM-HCTZ 100-25 MG PO TABS: 1.0000 | ORAL_TABLET | Freq: Every day | ORAL | Status: DC

## 2014-11-08 NOTE — Patient Instructions (Addendum)
Your physician has recommended you make the following change in your medication:  1) INCREASE Amlodipine 10 mg daily 2) STOP Losartan 3) START Hyzaar - 1 tablet  Daily 4) STOP Furosemide  Your physician wants you to follow-up in: 3 months with Dr. Caryl Comes.  You will receive a reminder letter in the mail two months in advance. If you don't receive a letter, please call our office to schedule the follow-up appointment.

## 2014-11-08 NOTE — Progress Notes (Signed)
Patient Care Team: Precious Reel, MD as PCP - General (Internal Medicine)   HPI  Amber [name] is a 68 y.o. female Seen in followup for atrial fibrillation. She is on apixaban.  This was identified by loop recorder   She's having some problems with dyspnea on exertion. In July, we undertook an echocardiogram demonstrating normal LV function but some degree of volume overload. We decreased her amlodipine  she was put on a diuretic and her symptoms have improved somewhat.  She continues with palpitations. Follow-up metabolic profile done at Dr. Keane Police office and apparently the potassium was normal.  Blood pressures home normally runs in the 140 range      Past Medical History  Diagnosis Date  . Chest pain     + Tn s/p cath 2012--Normal  . SOB (shortness of breath)   . HTN (hypertension)   . Palpitations     three types  . 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 Obesity   . History of tobacco abuse   . Post-menopausal   . Chest tightness   . Dizziness   . Right bundle branch block   . Hyperlipemia   . Abdominal mass     Pulsating  . Arthritis   . Osteoporosis     Past Surgical History  Procedure Laterality Date  . Breast reduction surgery      BILATERAL  . Carpal tunnel release    . Tonsillectomy and adenoidectomy  AGE 29  . Ovarian cyst removal  1978  . Cardiac catheterization  02-25-2011    EF 60%. Left coronary artery arises and distributes normal, right coronary artery is a dominant vessel and is normal  . US echocardiography  12-23-2006    EF 55-60%  . Cardiovascular stress test  08-27-2002    EF 76%  . Breast biopsy      left breast- benign    Current Outpatient Prescriptions  Medication Sig Dispense Refill  . amLODipine (NORVASC) 10 MG tablet Take 0.5 tablets (5 mg total) by mouth daily. 90 tablet 1  . Cholecalciferol (VITAMIN D PO) Take by mouth daily.      Marland Kitchen ELIQUIS 5 MG TABS tablet TAKE 1 TABLET BY MOUTH TWICE DAILY 60 tablet 4  . esomeprazole  (NEXIUM) 40 MG capsule Take 40 mg by mouth as needed.      . fish oil-omega-3 fatty acids 1000 MG capsule Take 2 g by mouth daily.      . furosemide (LASIX) 20 MG tablet Take 1 tablet (20 mg total) by mouth daily. 90 tablet 1  . losartan (COZAAR) 100 MG tablet Take 100 mg by mouth daily.      . metoprolol succinate (TOPROL-XL) 100 MG 24 hr tablet TAKE 1 TABLET BY MOUTH EVERY DAY 90 tablet 0  . potassium chloride (K-DUR,KLOR-CON) 10 MEQ tablet Take 10 mEq by mouth once.    . propranolol (INDERAL) 10 MG tablet TAKE 1 TABLET BY MOUTH EVERY DAY AS NEEDED FOR PALPITATIONS 90 tablet 0  . rosuvastatin (CRESTOR) 5 MG tablet Take 5 mg by mouth 3 (three) times a week.    . sertraline (ZOLOFT) 50 MG tablet Take 25 mg by mouth daily.      No current facility-administered medications for this visit.    Allergies  Allergen Reactions  . Ciprofloxacin Itching and Swelling    Review of Systems negative except from HPI and PMH  Physical Exam BP 142/80 mmHg  Pulse  89  Ht 5\' 4"  (1.626 m)  Wt 79.017 kg (174 lb 3.2 oz)  BMI 29.89 kg/m2 Well developed and well nourished in no acute distress HENT normal E scleral and icterus clear Neck Supple JVP flat; carotids brisk and full Clear to ausculation  Regular rate and rhythm, no murmurs gallops or rub Soft with active bowel sounds No clubbing cyanosis  Edema Alert and oriented, grossly normal motor and sensory function Skin Warm and Dry  Recorder demonstrates episodes of atrial tachycardia with block   Assessment and  Plan  Hypertensive   Palpitations  Dyspnea on exertion  Loop recorder  Obesity  Her blood pressure remains elevated here and at home. I'll take the liberty of increasing her amlodipine from 5--10 and also to add hydrochlorothiazide to her losartan. We'll discontinue her Lasix.  We anticipate her loop recorder running out of battery next year and she she will let us know when she wants to have explanted.  Exercise tolerance  is improved. She continues to work on weight loss.   She continues to have palpitations sometimes with and sometimes without ECG abnormality

## 2014-11-26 ENCOUNTER — Other Ambulatory Visit: Payer: Self-pay | Admitting: Internal Medicine

## 2014-12-08 ENCOUNTER — Other Ambulatory Visit: Payer: Self-pay | Admitting: Internal Medicine

## 2014-12-12 ENCOUNTER — Telehealth: Payer: Self-pay | Admitting: Internal Medicine

## 2014-12-12 DIAGNOSIS — Z6830 Body mass index (BMI) 30.0-30.9, adult: Secondary | ICD-10-CM | POA: Diagnosis not present

## 2014-12-12 DIAGNOSIS — R1031 Right lower quadrant pain: Secondary | ICD-10-CM | POA: Diagnosis not present

## 2014-12-12 DIAGNOSIS — M545 Low back pain: Secondary | ICD-10-CM | POA: Diagnosis not present

## 2014-12-12 DIAGNOSIS — R8299 Other abnormal findings in urine: Secondary | ICD-10-CM | POA: Diagnosis not present

## 2014-12-12 DIAGNOSIS — R8290 Unspecified abnormal findings in urine: Secondary | ICD-10-CM | POA: Diagnosis not present

## 2014-12-12 NOTE — Telephone Encounter (Signed)
New problem   Pt need to speak to nurse to sched an appt to get her loop recorder removed. Please call pt.

## 2014-12-19 NOTE — Telephone Encounter (Signed)
Informed patient I would review with Dr. Caryl Comes and let her know. She is agreeable to this.

## 2014-12-20 DIAGNOSIS — K589 Irritable bowel syndrome without diarrhea: Secondary | ICD-10-CM | POA: Diagnosis not present

## 2014-12-20 DIAGNOSIS — E876 Hypokalemia: Secondary | ICD-10-CM | POA: Diagnosis not present

## 2014-12-20 DIAGNOSIS — M8580 Other specified disorders of bone density and structure, unspecified site: Secondary | ICD-10-CM | POA: Diagnosis not present

## 2014-12-20 DIAGNOSIS — E785 Hyperlipidemia, unspecified: Secondary | ICD-10-CM | POA: Diagnosis not present

## 2014-12-20 DIAGNOSIS — M8588 Other specified disorders of bone density and structure, other site: Secondary | ICD-10-CM | POA: Diagnosis not present

## 2014-12-20 DIAGNOSIS — R7301 Impaired fasting glucose: Secondary | ICD-10-CM | POA: Diagnosis not present

## 2014-12-20 DIAGNOSIS — R1031 Right lower quadrant pain: Secondary | ICD-10-CM | POA: Diagnosis not present

## 2014-12-20 DIAGNOSIS — E669 Obesity, unspecified: Secondary | ICD-10-CM | POA: Diagnosis not present

## 2014-12-20 DIAGNOSIS — Z6831 Body mass index (BMI) 31.0-31.9, adult: Secondary | ICD-10-CM | POA: Diagnosis not present

## 2014-12-20 DIAGNOSIS — I10 Essential (primary) hypertension: Secondary | ICD-10-CM | POA: Diagnosis not present

## 2014-12-20 DIAGNOSIS — I480 Paroxysmal atrial fibrillation: Secondary | ICD-10-CM | POA: Diagnosis not present

## 2014-12-20 DIAGNOSIS — M545 Low back pain: Secondary | ICD-10-CM | POA: Diagnosis not present

## 2014-12-20 DIAGNOSIS — E871 Hypo-osmolality and hyponatremia: Secondary | ICD-10-CM | POA: Diagnosis not present

## 2014-12-21 ENCOUNTER — Encounter: Payer: Self-pay | Admitting: Internal Medicine

## 2014-12-21 NOTE — Telephone Encounter (Addendum)
Scheduled LINQ explant for next week, 1/13. Reviewed instructions with patient, advised to arrive at [facility] hospital at 10:00 a.m. Reviewed monitoring for signs of infection post explant. Patient requesting 6 month follow up, from last OV, with Dr. Caryl Comes - I entered recall. Patient verbalized understanding and agreeable to plan.   Kristin, device clinic, talked with patient about wound check (secondary to older/bigger device) -- scheduled for 1/26 with device clinic.

## 2014-12-28 ENCOUNTER — Other Ambulatory Visit: Payer: Self-pay | Admitting: Internal Medicine

## 2014-12-28 ENCOUNTER — Encounter (HOSPITAL_COMMUNITY)
Admission: RE | Disposition: A | Discharge status: Home / Self Care | Payer: Self-pay | Source: Ambulatory Visit | Attending: Internal Medicine

## 2014-12-28 ENCOUNTER — Ambulatory Visit (HOSPITAL_COMMUNITY)
Admission: RE | Admit: 2014-12-28 | Discharge: 2014-12-28 | Disposition: A | Discharge status: Home / Self Care | Payer: Medicare Other | Source: Ambulatory Visit | Attending: Internal Medicine | Admitting: Internal Medicine

## 2014-12-28 ENCOUNTER — Other Ambulatory Visit: Payer: Self-pay | Admitting: *Deleted

## 2014-12-28 ENCOUNTER — Encounter (HOSPITAL_COMMUNITY): Payer: Self-pay | Admitting: Internal Medicine

## 2014-12-28 DIAGNOSIS — E785 Hyperlipidemia, unspecified: Secondary | ICD-10-CM | POA: Diagnosis not present

## 2014-12-28 DIAGNOSIS — R609 Edema, unspecified: Secondary | ICD-10-CM | POA: Insufficient documentation

## 2014-12-28 DIAGNOSIS — M810 Age-related osteoporosis without current pathological fracture: Secondary | ICD-10-CM | POA: Insufficient documentation

## 2014-12-28 DIAGNOSIS — R002 Palpitations: Secondary | ICD-10-CM

## 2014-12-28 DIAGNOSIS — Z4509 Encounter for adjustment and management of other cardiac device: Secondary | ICD-10-CM | POA: Diagnosis not present

## 2014-12-28 DIAGNOSIS — M1990 Unspecified osteoarthritis, unspecified site: Secondary | ICD-10-CM | POA: Insufficient documentation

## 2014-12-28 DIAGNOSIS — I10 Essential (primary) hypertension: Secondary | ICD-10-CM | POA: Insufficient documentation

## 2014-12-28 DIAGNOSIS — I4510 Unspecified right bundle-branch block: Secondary | ICD-10-CM | POA: Diagnosis not present

## 2014-12-28 DIAGNOSIS — I480 Paroxysmal atrial fibrillation: Secondary | ICD-10-CM | POA: Diagnosis not present

## 2014-12-28 DIAGNOSIS — Z87891 Personal history of nicotine dependence: Secondary | ICD-10-CM | POA: Diagnosis not present

## 2014-12-28 DIAGNOSIS — Z79899 Other long term (current) drug therapy: Secondary | ICD-10-CM | POA: Insufficient documentation

## 2014-12-28 DIAGNOSIS — Z959 Presence of cardiac and vascular implant and graft, unspecified: Secondary | ICD-10-CM

## 2014-12-28 DIAGNOSIS — E669 Obesity, unspecified: Secondary | ICD-10-CM | POA: Insufficient documentation

## 2014-12-28 HISTORY — PX: LOOP RECORDER EXPLANT: SHX5476

## 2014-12-28 LAB — BASIC METABOLIC PANEL
Anion gap: 9 (ref 5–15)
BUN: 8 mg/dL (ref 6–23)
CALCIUM: 9.3 mg/dL (ref 8.4–10.5)
CO2: 28 mmol/L (ref 19–32)
CREATININE: 0.66 mg/dL (ref 0.50–1.10)
Chloride: 90 mEq/L — ABNORMAL LOW (ref 96–112)
GFR calc Af Amer: >90 mL/min (ref >90)
GFR, EST NON AFRICAN AMERICAN: 88 mL/min — AB (ref >90)
Glucose, Bld: 110 mg/dL — ABNORMAL HIGH (ref 70–99)
Potassium: 3.7 mmol/L (ref 3.5–5.1)
Sodium: 127 mmol/L — ABNORMAL LOW (ref 135–145)

## 2014-12-28 LAB — CBC
HCT: 39.5 % (ref 36.0–46.0)
HEMOGLOBIN: 14.3 g/dL (ref 12.0–15.0)
MCH: 35 pg — AB (ref 26.0–34.0)
MCHC: 36.2 g/dL — ABNORMAL HIGH (ref 30.0–36.0)
MCV: 96.6 fL (ref 78.0–100.0)
PLATELETS: 270 10*3/uL (ref 150–400)
RBC: 4.09 MIL/uL (ref 3.87–5.11)
RDW: 12.5 % (ref 11.5–15.5)
WBC: 5.1 10*3/uL (ref 4.0–10.5)

## 2014-12-28 LAB — SURGICAL PCR SCREEN
MRSA, PCR: NEGATIVE
Staphylococcus aureus: NEGATIVE

## 2014-12-28 SURGERY — LOOP RECORDER EXPLANT
Anesthesia: LOCAL

## 2014-12-28 MED ORDER — MUPIROCIN 2 % EX OINT
TOPICAL_OINTMENT | CUTANEOUS | Status: AC
  Administered 2014-12-28: 1
  Filled 2014-12-28: qty 22

## 2014-12-28 MED ORDER — ACETAMINOPHEN 325 MG PO TABS: 325.0000 mg | ORAL_TABLET | ORAL | Status: DC | PRN

## 2014-12-28 MED ORDER — AMLODIPINE BESYLATE 10 MG PO TABS: 5.0000 mg | ORAL_TABLET | Freq: Every day | ORAL | Status: DC

## 2014-12-28 MED ORDER — DIAZEPAM 5 MG PO TABS
ORAL_TABLET | ORAL | Status: AC
  Administered 2014-12-28: 10 mg via ORAL
  Filled 2014-12-28: qty 2

## 2014-12-28 MED ORDER — SODIUM CHLORIDE 0.9 % IV SOLN
INTRAVENOUS | Status: DC
  Administered 2014-12-28: 11:00:00 via INTRAVENOUS

## 2014-12-28 MED ORDER — LABETALOL HCL 100 MG PO TABS: 100.0000 mg | ORAL_TABLET | Freq: Two times a day (BID) | ORAL | Status: DC

## 2014-12-28 MED ORDER — CEFAZOLIN SODIUM-DEXTROSE 2-3 GM-% IV SOLR
2.0000 g | INTRAVENOUS | Status: DC
  Filled 2014-12-28: qty 50

## 2014-12-28 MED ORDER — SODIUM CHLORIDE 0.9 % IR SOLN
80.0000 mg | Status: DC
  Filled 2014-12-28: qty 2

## 2014-12-28 MED ORDER — DIAZEPAM 5 MG PO TABS
10.0000 mg | ORAL_TABLET | ORAL | Status: AC
  Administered 2014-12-28: 10 mg via ORAL
  Filled 2014-12-28: qty 2

## 2014-12-28 MED ORDER — LIDOCAINE HCL (PF) 1 % IJ SOLN
INTRAMUSCULAR | Status: AC
  Filled 2014-12-28: qty 60

## 2014-12-28 MED ORDER — ONDANSETRON HCL 4 MG/2ML IJ SOLN: 4.0000 mg | Freq: Four times a day (QID) | INTRAMUSCULAR | Status: DC | PRN

## 2014-12-28 NOTE — Discharge Instructions (Signed)

## 2014-12-28 NOTE — Telephone Encounter (Signed)
Message  Received: Today    Deboraha Sprang, MD  Stanton Kidney, RN           Can u write script please for labetolol 100 mg bid than ks steve

## 2014-12-28 NOTE — H&P (Signed)
Patient Care Team: Precious Reel, MD as PCP - General (Internal Medicine)   HPI  Amber [name] is a 69 y.o. female Seen for loop recorder explantation followup for atrial fibrillation. Initially implanted for palpitations She is on apixaban.   . In July, we undertook an echocardiogram demonstrating normal LV function and elevate E/E' and diuretics initiated for SOB  ollow-up metabolic profile done at Dr. Keane Police office and apparently the potassium was normal.  Blood pressures home normally runs in the 140 range and at last office visit adjustments were made and has been much better at home, unfortunately the increased amlodipine has been assoc with edema  Past Medical History  Diagnosis Date  . Chest pain     + Tn s/p cath 2012--Normal  . SOB (shortness of breath)   . HTN (hypertension)   . Palpitations     three types  . 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 Obesity   . History of tobacco abuse   . Post-menopausal   . Chest tightness   . Dizziness   . Right bundle branch block   . Hyperlipemia   . Abdominal mass     Pulsating  . Arthritis   . Osteoporosis     Past Surgical History  Procedure Laterality Date  . Breast reduction surgery      BILATERAL  . Carpal tunnel release    . Tonsillectomy and adenoidectomy  AGE 89  . Ovarian cyst removal  1978  . Cardiac catheterization  02-25-2011    EF 60%. Left coronary artery arises and distributes normal, right coronary artery is a dominant vessel and is normal  . US echocardiography  12-23-2006    EF 55-60%  . Cardiovascular stress test  08-27-2002    EF 76%  . Breast biopsy      left breast- benign    No current facility-administered medications for this encounter.    Allergies  Allergen Reactions  . Ciprofloxacin Itching and Swelling     Medication List    ASK your doctor about these medications        amLODipine 10 MG tablet  Commonly known as:  NORVASC  Take 1 tablet (10 mg total) by mouth daily.       ELIQUIS 5 MG Tabs tablet  Generic drug:  apixaban  TAKE 1 TABLET BY MOUTH TWICE DAILY     esomeprazole 40 MG capsule  Commonly known as:  NEXIUM  Take 40 mg by mouth daily as needed (heartburn).     fish oil-omega-3 fatty acids 1000 MG capsule  Take 2 g by mouth daily with lunch.     losartan-hydrochlorothiazide 100-25 MG per tablet  Commonly known as:  HYZAAR  Take 1 tablet by mouth daily.     metoprolol succinate 100 MG 24 hr tablet  Commonly known as:  TOPROL-XL  TAKE 1 TABLET BY MOUTH EVERY DAY     potassium chloride 10 MEQ tablet  Commonly known as:  K-DUR,KLOR-CON  Take 10 mEq by mouth daily.     propranolol 10 MG tablet  Commonly known as:  INDERAL  TAKE 1 TABLET BY MOUTH EVERY DAY AS NEEDED FOR PALPITATIONS     sertraline 50 MG tablet  Commonly known as:  ZOLOFT  Take 25 mg by mouth daily.     VITAMIN D PO  Take 1 tablet by mouth daily.        Review of Systems negative  except from HPI and PMH  Physical Exam BP 166/73 mmHg  Pulse 86  Temp(Src) 98.2 F (36.8 C) (Oral)  Resp 20  Ht 5\' 4"  (1.626 m)  Wt 175 lb (79.379 kg)  BMI 30.02 kg/m2  SpO2 100% Well developed and well nourished in no acute distress HENT normal E scleral and icterus clear Neck Supple JVP flat; carotids brisk and full Clear to ausculation Device pocket well healed; without hematoma or erythema.  There is no tethering  Regular rate and rhythm, no murmurs gallops or rub Soft with active bowel sounds No clubbing cyanosis tr Edema Alert and oriented, grossly normal motor and sensory function Skin Warm and Dry    Assessment and  Plan Atrial fib  Paroxysmal  Hypertension  Edema  Loop recorder in situ  Will remove device today Will decrease amlodipine to 5 and change metoprolol to labetolol 100 bid for BP

## 2014-12-28 NOTE — CV Procedure (Signed)
DANELLY HASSINGER [id number]  [id number]  Preop Dx: palpitations loop recorder in situ Postop Dx same/   Procedure:device explant  Cx: None   EBL: Minimal    After routine prep and drape, lidocaine inflitrated along the line of the previous incision and carried tdown to the device pocket  Was opened explanted and the incision closed in 3 layers   dermabond dressing was applied  Virl Axe, MD 12/28/2014 1:20 PM

## 2014-12-28 NOTE — Progress Notes (Signed)
Discharge instruction given to pt and family per MD order.  Pt  And family able to verbalize understanding.  Sling fitted to left arm.  Pt to car via wheelchair

## 2015-01-10 ENCOUNTER — Ambulatory Visit (INDEPENDENT_AMBULATORY_CARE_PROVIDER_SITE_OTHER): Payer: Medicare Other | Admitting: *Deleted

## 2015-01-10 DIAGNOSIS — I480 Paroxysmal atrial fibrillation: Secondary | ICD-10-CM

## 2015-01-10 NOTE — Progress Notes (Signed)
Wound check for ILR explant.  Wound well healed.  Follow up in May with Dr. Caryl Comes.

## 2015-02-14 ENCOUNTER — Encounter: Payer: Medicare Other | Admitting: Internal Medicine

## 2015-02-14 ENCOUNTER — Other Ambulatory Visit: Payer: Self-pay | Admitting: Internal Medicine

## 2015-03-03 DIAGNOSIS — H2513 Age-related nuclear cataract, bilateral: Secondary | ICD-10-CM | POA: Diagnosis not present

## 2015-03-03 DIAGNOSIS — H5203 Hypermetropia, bilateral: Secondary | ICD-10-CM | POA: Diagnosis not present

## 2015-03-15 ENCOUNTER — Other Ambulatory Visit: Payer: Self-pay | Admitting: Internal Medicine

## 2015-03-16 ENCOUNTER — Other Ambulatory Visit: Payer: Self-pay | Admitting: Internal Medicine

## 2015-03-23 DIAGNOSIS — H2511 Age-related nuclear cataract, right eye: Secondary | ICD-10-CM | POA: Diagnosis not present

## 2015-03-23 DIAGNOSIS — H25811 Combined forms of age-related cataract, right eye: Secondary | ICD-10-CM | POA: Diagnosis not present

## 2015-03-28 DIAGNOSIS — M7071 Other bursitis of hip, right hip: Secondary | ICD-10-CM | POA: Diagnosis not present

## 2015-03-28 DIAGNOSIS — M545 Low back pain: Secondary | ICD-10-CM | POA: Diagnosis not present

## 2015-03-28 DIAGNOSIS — Z6830 Body mass index (BMI) 30.0-30.9, adult: Secondary | ICD-10-CM | POA: Diagnosis not present

## 2015-04-06 DIAGNOSIS — H25812 Combined forms of age-related cataract, left eye: Secondary | ICD-10-CM | POA: Diagnosis not present

## 2015-04-06 DIAGNOSIS — H2512 Age-related nuclear cataract, left eye: Secondary | ICD-10-CM | POA: Diagnosis not present

## 2015-04-06 DIAGNOSIS — H2511 Age-related nuclear cataract, right eye: Secondary | ICD-10-CM | POA: Diagnosis not present

## 2015-04-20 ENCOUNTER — Encounter: Payer: Self-pay | Admitting: Internal Medicine

## 2015-04-20 ENCOUNTER — Ambulatory Visit (INDEPENDENT_AMBULATORY_CARE_PROVIDER_SITE_OTHER): Payer: Medicare Other | Admitting: Internal Medicine

## 2015-04-20 VITALS — BP 145/76 | HR 78 | Ht 64.0 in | Wt 175.0 lb

## 2015-04-20 DIAGNOSIS — I480 Paroxysmal atrial fibrillation: Secondary | ICD-10-CM

## 2015-04-20 MED ORDER — LABETALOL HCL 200 MG PO TABS: 200.0000 mg | ORAL_TABLET | Freq: Two times a day (BID) | ORAL | Status: DC

## 2015-04-20 NOTE — Patient Instructions (Signed)
Medication Instructions:  Your physician has recommended you make the following change in your medication:  1) INCREASE Labetalol to 200 mg twice daily  2) STOP Amlodipine  Labwork: None ordered  Testing/Procedures: None ordered  Follow-Up: Your physician wants you to follow-up in: 6 months with Dr. Caryl Comes. You will receive a reminder letter in the mail two months in advance. If you don't receive a letter, please call our office to schedule the follow-up appointment.   Thank you for choosing [facility]!!

## 2015-04-20 NOTE — Progress Notes (Signed)
Patient Care Team: Shon Baton, MD as PCP - General (Internal Medicine)   HPI  Amber [name] is a 69 y.o. female Seen in followup for atrial fibrillation. She is on apixaban.  This was identified by loop recorder   She's having some problems with dyspnea on exertion. In July, we undertook an echocardiogram demonstrating normal LV function but some degree of volume overload. We decreased her amlodipine  she was put on a diuretic and her symptoms have improved somewhat.  She continues with palpitations. Follow-up metabolic profile done at Dr. Keane Police office and apparently the potassium was normal.  Blood pressures home normally runs in the 120 range      Past Medical History  Diagnosis Date  . Chest pain     + Tn s/p cath 2012--Normal  . SOB (shortness of breath)   . HTN (hypertension)   . Palpitations     three types  . 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 Obesity   . History of tobacco abuse   . Post-menopausal   . Chest tightness   . Dizziness   . Right bundle branch block   . Hyperlipemia   . Abdominal mass     Pulsating  . Arthritis   . Osteoporosis     Past Surgical History  Procedure Laterality Date  . Breast reduction surgery      BILATERAL  . Carpal tunnel release    . Tonsillectomy and adenoidectomy  AGE 69  . Ovarian cyst removal  1978  . Cardiac catheterization  02-25-2011    EF 60%. Left coronary artery arises and distributes normal, right coronary artery is a dominant vessel and is normal  . US echocardiography  12-23-2006    EF 55-60%  . Cardiovascular stress test  08-27-2002    EF 76%  . Breast biopsy      left breast- benign  . Loop recorder explant N/A 12/28/2014    Procedure: LOOP RECORDER EXPLANT;  Surgeon: Deboraha Sprang, MD;  Location: [facility] CATH LAB;  Service: Cardiovascular;  Laterality: N/A;    Current Outpatient Prescriptions  Medication Sig Dispense Refill  . amLODipine (NORVASC) 10 MG tablet Take 0.5 tablets (5 mg total) by mouth  daily. 90 tablet 1  . Cholecalciferol (VITAMIN D PO) Take 1 tablet by mouth daily.     Marland Kitchen ELIQUIS 5 MG TABS tablet TAKE 1 TABLET BY MOUTH TWICE DAILY 60 tablet 6  . esomeprazole (NEXIUM) 40 MG capsule Take 40 mg by mouth daily as needed (heartburn).     . fish oil-omega-3 fatty acids 1000 MG capsule Take 2 g by mouth daily with lunch.     . labetalol (NORMODYNE) 100 MG tablet TAKE 1 TABLET BY MOUTH TWICE DAILY 180 tablet 0  . losartan-hydrochlorothiazide (HYZAAR) 100-25 MG per tablet TAKE 1 TABLET BY MOUTH DAILY 90 tablet 1  . potassium chloride (K-DUR,KLOR-CON) 10 MEQ tablet Take 10 mEq by mouth daily.     . propranolol (INDERAL) 10 MG tablet TAKE 1 TABLET BY MOUTH EVERY DAY AS NEEDED FOR PALPITATIONS 90 tablet 0  . sertraline (ZOLOFT) 50 MG tablet Take 25 mg by mouth daily.     Marland Kitchen amLODipine (NORVASC) 10 MG tablet TAKE 1 TABLET BY MOUTH EVERY DAY (Patient not taking: Reported on 04/20/2015) 90 tablet 0   No current facility-administered medications for this visit.    Allergies  Allergen Reactions  . Ciprofloxacin Itching and Swelling    Review  of Systems negative except from HPI and PMH  Physical Exam BP 145/76 mmHg  Pulse 78  Ht 5\' 4"  (1.626 m)  Wt 175 lb (79.379 kg)  BMI 30.02 kg/m2 Well developed and well nourished in no acute distress HENT normal E scleral and icterus clear Neck Supple JVP flat; carotids brisk and full Clear to ausculation  Regular rate and rhythm, no murmurs gallops or rub Soft with active bowel sounds No clubbing cyanosis  Edema Alert and oriented, grossly normal motor and sensory function Skin Warm and Dry  ECG demonstrates sinus rhythm at 84 Intervals 13/09/39 Incomplete right bundle branch block Assessment and  Plan  Hypertensive   Atrial fibrillation paroxysmal   Continues with paroxysms of atrial fibrillation that are reasonably well-tolerated  Exercise tolerance is improved. She continues to work on weight loss.    her blood pressure is  modestly elevated today. At home it's been in the 120-1:30 range. We will try to simplify her regime. We will discontinue her amlodipine and have her increase her labetalol from 100--200 twice a day.

## 2015-04-25 DIAGNOSIS — M25551 Pain in right hip: Secondary | ICD-10-CM | POA: Diagnosis not present

## 2015-04-25 DIAGNOSIS — M7071 Other bursitis of hip, right hip: Secondary | ICD-10-CM | POA: Diagnosis not present

## 2015-05-05 DIAGNOSIS — M7061 Trochanteric bursitis, right hip: Secondary | ICD-10-CM | POA: Diagnosis not present

## 2015-05-05 DIAGNOSIS — M7062 Trochanteric bursitis, left hip: Secondary | ICD-10-CM | POA: Diagnosis not present

## 2015-05-09 ENCOUNTER — Other Ambulatory Visit: Payer: Self-pay | Admitting: Internal Medicine

## 2015-05-09 DIAGNOSIS — M5489 Other dorsalgia: Secondary | ICD-10-CM

## 2015-05-10 DIAGNOSIS — M7061 Trochanteric bursitis, right hip: Secondary | ICD-10-CM | POA: Diagnosis not present

## 2015-05-10 DIAGNOSIS — M7062 Trochanteric bursitis, left hip: Secondary | ICD-10-CM | POA: Diagnosis not present

## 2015-05-11 ENCOUNTER — Ambulatory Visit: Payer: Medicare Other | Admitting: Internal Medicine

## 2015-05-27 ENCOUNTER — Ambulatory Visit
Admission: RE | Admit: 2015-05-27 | Discharge: 2015-05-27 | Disposition: A | Discharge status: Home / Self Care | Payer: Medicare Other | Source: Ambulatory Visit | Attending: Internal Medicine | Admitting: Internal Medicine

## 2015-05-27 DIAGNOSIS — M5489 Other dorsalgia: Secondary | ICD-10-CM

## 2015-05-27 DIAGNOSIS — M4806 Spinal stenosis, lumbar region: Secondary | ICD-10-CM | POA: Diagnosis not present

## 2015-05-27 DIAGNOSIS — M4316 Spondylolisthesis, lumbar region: Secondary | ICD-10-CM | POA: Diagnosis not present

## 2015-05-27 DIAGNOSIS — M5137 Other intervertebral disc degeneration, lumbosacral region: Secondary | ICD-10-CM | POA: Diagnosis not present

## 2015-05-27 DIAGNOSIS — M47816 Spondylosis without myelopathy or radiculopathy, lumbar region: Secondary | ICD-10-CM | POA: Diagnosis not present

## 2015-05-29 DIAGNOSIS — M7062 Trochanteric bursitis, left hip: Secondary | ICD-10-CM | POA: Diagnosis not present

## 2015-05-29 DIAGNOSIS — M7061 Trochanteric bursitis, right hip: Secondary | ICD-10-CM | POA: Diagnosis not present

## 2015-06-10 DIAGNOSIS — M5136 Other intervertebral disc degeneration, lumbar region: Secondary | ICD-10-CM | POA: Diagnosis not present

## 2015-06-10 DIAGNOSIS — M4807 Spinal stenosis, lumbosacral region: Secondary | ICD-10-CM | POA: Diagnosis not present

## 2015-06-10 DIAGNOSIS — M5416 Radiculopathy, lumbar region: Secondary | ICD-10-CM | POA: Diagnosis not present

## 2015-06-11 ENCOUNTER — Other Ambulatory Visit: Payer: Self-pay | Admitting: Internal Medicine

## 2015-06-12 ENCOUNTER — Telehealth: Payer: Self-pay | Admitting: Internal Medicine

## 2015-06-12 NOTE — Telephone Encounter (Signed)
New Message    Request for surgical clearance:  1. What type of surgery is being performed?   Lumbar Epidural Steroid Injection  2. When is this surgery scheduled?   07-04-2015  3. Are there any medications that need to be held prior to surgery and how long?     ELIQUIS 3 days before  4. Name of physician performing surgery? Dr. Suella Broad  5. What is your office phone and fax number?   Fax number   [phone]   Phone [phone]   EXT   1310

## 2015-06-12 NOTE — Telephone Encounter (Signed)
Will forward to Elberta Leatherwood, [facility] and Dr Caryl Comes for review.

## 2015-06-12 NOTE — Telephone Encounter (Signed)
Pt has a CHADS score of 1.  Okay to hold Eliquis x 3 days prior to injection.  Will fax clearance to Dr. Nelva Bush' office.

## 2015-06-16 DIAGNOSIS — M899 Disorder of bone, unspecified: Secondary | ICD-10-CM | POA: Diagnosis not present

## 2015-06-16 DIAGNOSIS — E785 Hyperlipidemia, unspecified: Secondary | ICD-10-CM | POA: Diagnosis not present

## 2015-06-16 DIAGNOSIS — R002 Palpitations: Secondary | ICD-10-CM | POA: Diagnosis not present

## 2015-06-16 DIAGNOSIS — Z0000 Encounter for general adult medical examination without abnormal findings: Secondary | ICD-10-CM | POA: Diagnosis not present

## 2015-06-16 DIAGNOSIS — R7301 Impaired fasting glucose: Secondary | ICD-10-CM | POA: Diagnosis not present

## 2015-06-22 DIAGNOSIS — Z0000 Encounter for general adult medical examination without abnormal findings: Secondary | ICD-10-CM | POA: Diagnosis not present

## 2015-06-22 DIAGNOSIS — Z1389 Encounter for screening for other disorder: Secondary | ICD-10-CM | POA: Diagnosis not present

## 2015-06-22 DIAGNOSIS — I480 Paroxysmal atrial fibrillation: Secondary | ICD-10-CM | POA: Diagnosis not present

## 2015-06-22 DIAGNOSIS — M545 Low back pain: Secondary | ICD-10-CM | POA: Diagnosis not present

## 2015-06-22 DIAGNOSIS — Z6830 Body mass index (BMI) 30.0-30.9, adult: Secondary | ICD-10-CM | POA: Diagnosis not present

## 2015-06-22 DIAGNOSIS — R600 Localized edema: Secondary | ICD-10-CM | POA: Diagnosis not present

## 2015-06-22 DIAGNOSIS — K589 Irritable bowel syndrome without diarrhea: Secondary | ICD-10-CM | POA: Diagnosis not present

## 2015-06-22 DIAGNOSIS — I10 Essential (primary) hypertension: Secondary | ICD-10-CM | POA: Diagnosis not present

## 2015-06-22 DIAGNOSIS — R251 Tremor, unspecified: Secondary | ICD-10-CM | POA: Diagnosis not present

## 2015-06-22 DIAGNOSIS — N6019 Diffuse cystic mastopathy of unspecified breast: Secondary | ICD-10-CM | POA: Diagnosis not present

## 2015-06-22 DIAGNOSIS — D72819 Decreased white blood cell count, unspecified: Secondary | ICD-10-CM | POA: Diagnosis not present

## 2015-06-22 DIAGNOSIS — E785 Hyperlipidemia, unspecified: Secondary | ICD-10-CM | POA: Diagnosis not present

## 2015-07-04 DIAGNOSIS — M5136 Other intervertebral disc degeneration, lumbar region: Secondary | ICD-10-CM | POA: Diagnosis not present

## 2015-07-24 ENCOUNTER — Other Ambulatory Visit: Payer: Self-pay | Admitting: Internal Medicine

## 2015-07-27 DIAGNOSIS — M5416 Radiculopathy, lumbar region: Secondary | ICD-10-CM | POA: Diagnosis not present

## 2015-07-27 DIAGNOSIS — M4806 Spinal stenosis, lumbar region: Secondary | ICD-10-CM | POA: Diagnosis not present

## 2015-07-27 DIAGNOSIS — M5136 Other intervertebral disc degeneration, lumbar region: Secondary | ICD-10-CM | POA: Diagnosis not present

## 2015-08-08 ENCOUNTER — Telehealth: Payer: Self-pay | Admitting: Internal Medicine

## 2015-08-08 NOTE — Telephone Encounter (Signed)
New message      Request for surgical clearance:  What type of surgery is being performed? Back injection When is this surgery scheduled? 09-05-15 Are there any medications that need to be held prior to surgery and how long? Stop eliquis 3 days prior to injection Name of physician performing surgery? Dr Nelva Bush 1. What is your office phone and fax number? Fax [phone]

## 2015-08-08 NOTE — Telephone Encounter (Signed)
Patient has a past surgical clearance of this some type in phone note from 06/12/2015. At that time Amber [name], the pharmacist addressed. Will send to [facility].

## 2015-08-08 NOTE — Telephone Encounter (Signed)
Pt has a CHADS score of 1. Okay to hold Eliquis x 3 days prior to injection. Will fax clearance to Dr. Nelva Bush' office.

## 2015-09-04 ENCOUNTER — Telehealth: Payer: Self-pay | Admitting: Internal Medicine

## 2015-09-04 NOTE — Telephone Encounter (Signed)
New Message  Patient c/o Palpitations:  High priority if patient c/o lightheadedness and shortness of breath.  1. How long have you been having palpitations? Afib for about 10 hours. Just a racing heart and a lot of irregularity. That is better today but she says that today this morning she has almost fainted   2. Are you currently experiencing lightheadedness and shortness of breath? Not right now. But she was having these symptoms about 1 hour ago   3. Have you checked your BP and heart rate? (document readings) She did not   4. Are you experiencing any other symptoms? No, just almost fainting irregularity.  Comments: pt states that she has an injection scheduled for 09/05/2015 she has been out of Eliquis sense Friday. She requests a call back to determine if she should move forward with the injection or just the Eliquis.

## 2015-09-04 NOTE — Telephone Encounter (Signed)
Reviewed the message below with Dr. Caryl Comes. If still in a-fib, ok to continue to hold Eliquis and proceed with injection tomorrow since off anticoagulation since Friday. The patient should re-start Eliquis as soon as it is safe post injection. If a-fib persists, the patient should be back on Eliquis x 5 doses/ 3 days and then plan for TEE/DCCV.  I called the patient to notify her of the Dr. Olin Pia recommendations. She states that she feels like she is back in rhythm now. She did have a pre-syncopal feeling this morning that lasted a couple of minutes then resolved. She took a dose of propranolol last night and again this morning. She has not taken her BP, but her HR was about 85 bpm. She inquired what to do if her a-fib started again. I advised that she should continue propranolol PRN, but it would be helpful to take her BP prior to and after the medication to assess if she is getting hypotensive. She reports her last episode was about 3-4 weeks ago, but last night was the longest episode she had. I advised her to continue to assess the frequency of her a-fib and if this occurs more often/ last longer, then to call and let us know. She verbalizes understanding.

## 2015-09-05 DIAGNOSIS — M4807 Spinal stenosis, lumbosacral region: Secondary | ICD-10-CM | POA: Diagnosis not present

## 2015-09-17 DIAGNOSIS — Z23 Encounter for immunization: Secondary | ICD-10-CM | POA: Diagnosis not present

## 2015-09-18 ENCOUNTER — Other Ambulatory Visit: Payer: Self-pay

## 2015-09-18 DIAGNOSIS — Z1231 Encounter for screening mammogram for malignant neoplasm of breast: Secondary | ICD-10-CM

## 2015-09-20 DIAGNOSIS — Z1212 Encounter for screening for malignant neoplasm of rectum: Secondary | ICD-10-CM | POA: Diagnosis not present

## 2015-10-05 DIAGNOSIS — M25552 Pain in left hip: Secondary | ICD-10-CM | POA: Diagnosis not present

## 2015-10-05 DIAGNOSIS — Z124 Encounter for screening for malignant neoplasm of cervix: Secondary | ICD-10-CM | POA: Diagnosis not present

## 2015-10-05 DIAGNOSIS — M25551 Pain in right hip: Secondary | ICD-10-CM | POA: Diagnosis not present

## 2015-10-05 DIAGNOSIS — Z01411 Encounter for gynecological examination (general) (routine) with abnormal findings: Secondary | ICD-10-CM | POA: Diagnosis not present

## 2015-10-20 ENCOUNTER — Ambulatory Visit (INDEPENDENT_AMBULATORY_CARE_PROVIDER_SITE_OTHER): Payer: Medicare Other | Admitting: Internal Medicine

## 2015-10-20 ENCOUNTER — Encounter: Payer: Self-pay | Admitting: Internal Medicine

## 2015-10-20 VITALS — BP 170/82 | HR 79 | Ht 64.0 in | Wt 173.8 lb

## 2015-10-20 DIAGNOSIS — I480 Paroxysmal atrial fibrillation: Secondary | ICD-10-CM

## 2015-10-20 MED ORDER — FLECAINIDE ACETATE 150 MG PO TABS: 300.0000 mg | ORAL_TABLET | Freq: Every day | ORAL | Status: DC | PRN

## 2015-10-20 MED ORDER — LABETALOL HCL 200 MG PO TABS: 400.0000 mg | ORAL_TABLET | Freq: Two times a day (BID) | ORAL | Status: DC

## 2015-10-20 NOTE — Patient Instructions (Addendum)
Medication Instructions:  Your physician has recommended you make the following change in your medication: 1) INCREASE Labetalol to 400 mg twice a day 2) STOP Amlodipine 2.5 mg once daily 3) TAKE Flecainide 300 mg once daily as needed for AFib.  Labwork: None ordered  Testing/Procedures: None ordered  Follow-Up: Your physician recommends that you schedule a follow-up appointment in: 8 weeks with Roderic Palau, NP in the AFib Clinic.    Any Other Special Instructions Will Be Listed Below (If Applicable).  If you need a refill on your cardiac medications before your next appointment, please call your pharmacy.  Thank you for choosing [facility]!!

## 2015-10-20 NOTE — Progress Notes (Signed)
Patient Care Team: Shon Baton, MD as PCP - General (Internal Medicine)   HPI  Amber [name] is a 69 y.o. female Seen in followup for atrial fibrillation. She is on apixaban.  This was identified by loop recorder   She continues to have episodes of atrial fibrillation every 3-to 4 weeks. They're lasting longer and quite disruptive.  She brings in her blood pressure records from home. They range from 115--180. Mostly they're in the 120-130 range.         Past Medical History  Diagnosis Date  . Chest pain     + Tn s/p cath 2012--Normal  . SOB (shortness of breath)   . HTN (hypertension)   . Palpitations     three types  . 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 Obesity   . History of tobacco abuse   . Post-menopausal   . Chest tightness   . Dizziness   . Right bundle branch block   . Hyperlipemia   . Abdominal mass     Pulsating  . Arthritis   . Osteoporosis     Past Surgical History  Procedure Laterality Date  . Breast reduction surgery      BILATERAL  . Carpal tunnel release    . Tonsillectomy and adenoidectomy  AGE 53  . Ovarian cyst removal  1978  . Cardiac catheterization  02-25-2011    EF 60%. Left coronary artery arises and distributes normal, right coronary artery is a dominant vessel and is normal  . US echocardiography  12-23-2006    EF 55-60%  . Cardiovascular stress test  08-27-2002    EF 76%  . Breast biopsy      left breast- benign  . Loop recorder explant N/A 12/28/2014    Procedure: LOOP RECORDER EXPLANT;  Surgeon: Deboraha Sprang, MD;  Location: [facility] CATH LAB;  Service: Cardiovascular;  Laterality: N/A;    Current Outpatient Prescriptions  Medication Sig Dispense Refill  . amLODipine (NORVASC) 2.5 MG tablet Take 1 tablet by mouth daily.  10  . Cholecalciferol (VITAMIN D PO) Take 1 tablet by mouth daily.     Marland Kitchen ELIQUIS 5 MG TABS tablet TAKE 1 TABLET BY MOUTH TWICE DAILY 60 tablet 11  . esomeprazole (NEXIUM) 40 MG capsule Take 40 mg by mouth  daily as needed (heartburn).     . fish oil-omega-3 fatty acids 1000 MG capsule Take 2 g by mouth daily with lunch.     . labetalol (NORMODYNE) 200 MG tablet Take 1 tablet (200 mg total) by mouth 2 (two) times daily. 180 tablet 2  . losartan-hydrochlorothiazide (HYZAAR) 100-25 MG per tablet TAKE 1 TABLET BY MOUTH DAILY 90 tablet 1  . potassium chloride (K-DUR,KLOR-CON) 10 MEQ tablet Take 10 mEq by mouth daily.     . propranolol (INDERAL) 10 MG tablet TAKE 1 TABLET BY MOUTH EVERY DAY AS NEEDED FOR PALPITATIONS 90 tablet 1  . sertraline (ZOLOFT) 50 MG tablet Take 25 mg by mouth daily.      No current facility-administered medications for this visit.    Allergies  Allergen Reactions  . Ciprofloxacin Itching and Swelling    Review of Systems negative except from HPI and PMH  Physical Exam BP 170/82 mmHg  Pulse 79  Ht 5\' 4"  (1.626 m)  Wt 173 lb 12.8 oz (78.835 kg)  BMI 29.82 kg/m2 Well developed and well nourished in no acute distress HENT normal E scleral and icterus clear  Neck Supple JVP flat; carotids brisk and full Clear to ausculation  Regular rate and rhythm, no murmurs gallops or rub Soft with active bowel sounds No clubbing cyanosis  Edema Alert and oriented, grossly normal motor and sensory function Skin Warm and Dry  ECG demonstrates sinus rhythm at 79 14/10/38  Incomplete right bundle branch block Assessment and  Plan  Hypertensive   Atrial fibrillation paroxysmal   Continues with paroxysms of atrial fibrillation She would like to alternative therapy. We discussed daily versus when necessary flecainide. She would prefer the latter not withstanding the fact that the events are every 3-4 weeks. She has no known coronary disease. We will begin her on 300 mg pill in the pocket. We'll have her follow-up with DC in about 8 weeks to see how things are going.  In the interim, we will adjust her antihypertensives. She has a number values at home the 160-80 range. We will  increase her labetalol 200--400 mg twice daily and discontinue her amlodipine. This will hopefully also protect her against the potential for paradoxical rate increased using a 1C antiarrhythmic in the context of atrial fibrillation

## 2015-10-23 DIAGNOSIS — L719 Rosacea, unspecified: Secondary | ICD-10-CM | POA: Diagnosis not present

## 2015-10-23 DIAGNOSIS — D225 Melanocytic nevi of trunk: Secondary | ICD-10-CM | POA: Diagnosis not present

## 2015-10-23 DIAGNOSIS — L821 Other seborrheic keratosis: Secondary | ICD-10-CM | POA: Diagnosis not present

## 2015-10-31 ENCOUNTER — Ambulatory Visit
Admission: RE | Admit: 2015-10-31 | Discharge: 2015-10-31 | Disposition: A | Discharge status: Home / Self Care | Payer: Medicare Other | Source: Ambulatory Visit

## 2015-10-31 DIAGNOSIS — Z1231 Encounter for screening mammogram for malignant neoplasm of breast: Secondary | ICD-10-CM | POA: Diagnosis not present

## 2015-11-06 ENCOUNTER — Telehealth: Payer: Self-pay | Admitting: Internal Medicine

## 2015-11-06 NOTE — Telephone Encounter (Signed)
Will forward to Dr. Klein to review. 

## 2015-11-06 NOTE — Telephone Encounter (Signed)
New problem   Pt need to speak to nurse concerning taking Flecainide she read side effects and want to know if she can take Toprol. Please advise.

## 2015-11-06 NOTE — Telephone Encounter (Signed)
toprol prn wont convert atrial fibrillation  The pill in the pocket avoids most of the side effects because of the single dosea

## 2015-11-07 NOTE — Telephone Encounter (Signed)
I called and spoke with the patient and made her aware of Dr. Olin Pia recommendations regarding flecainide. She reports that she feels like she did not have as many "episodes" on the toprol when she was taking it. This was stopped about 1 year ago.  She reports that she is having breakthrough of her rhythm about every 10 days and this can last minutes to hours. She is currently taking labetalol. The patient would like Dr. Caryl Comes to be aware of this.

## 2015-11-08 MED ORDER — METOPROLOL SUCCINATE ER 100 MG PO TB24: 100.0000 mg | ORAL_TABLET | Freq: Every day | ORAL | Status: DC

## 2015-11-08 NOTE — Telephone Encounter (Signed)
[facility]l back at previous dose. I have reviewed this with the patient. She was previously on toprol 100 mg once daily. I have advised her to decrease her labetalol from 400 mg BID to 200 mg BID for 2-3 days, then switch to her toprol dosing. She will carefully monitor her BP.

## 2015-11-19 ENCOUNTER — Encounter: Payer: Self-pay | Admitting: Internal Medicine

## 2015-11-21 ENCOUNTER — Other Ambulatory Visit: Payer: Self-pay | Admitting: *Deleted

## 2015-12-01 DIAGNOSIS — H5310 Unspecified subjective visual disturbances: Secondary | ICD-10-CM | POA: Diagnosis not present

## 2015-12-01 DIAGNOSIS — H353131 Nonexudative age-related macular degeneration, bilateral, early dry stage: Secondary | ICD-10-CM | POA: Diagnosis not present

## 2015-12-04 DIAGNOSIS — H349 Unspecified retinal vascular occlusion: Secondary | ICD-10-CM | POA: Diagnosis not present

## 2015-12-04 DIAGNOSIS — H5310 Unspecified subjective visual disturbances: Secondary | ICD-10-CM | POA: Diagnosis not present

## 2015-12-07 ENCOUNTER — Other Ambulatory Visit: Payer: Self-pay | Admitting: *Deleted

## 2015-12-07 DIAGNOSIS — H353131 Nonexudative age-related macular degeneration, bilateral, early dry stage: Secondary | ICD-10-CM | POA: Diagnosis not present

## 2015-12-07 DIAGNOSIS — H348312 Tributary (branch) retinal vein occlusion, right eye, stable: Secondary | ICD-10-CM | POA: Diagnosis not present

## 2015-12-07 MED ORDER — LOSARTAN POTASSIUM-HCTZ 100-25 MG PO TABS: 1.0000 | ORAL_TABLET | Freq: Every day | ORAL | Status: DC

## 2015-12-20 ENCOUNTER — Other Ambulatory Visit (HOSPITAL_COMMUNITY): Payer: Self-pay | Admitting: Nurse Practitioner

## 2015-12-20 ENCOUNTER — Ambulatory Visit (HOSPITAL_COMMUNITY)
Admission: RE | Admit: 2015-12-20 | Discharge: 2015-12-20 | Disposition: A | Discharge status: Home / Self Care | Payer: Medicare Other | Source: Ambulatory Visit | Attending: Nurse Practitioner | Admitting: Nurse Practitioner

## 2015-12-20 ENCOUNTER — Encounter (HOSPITAL_COMMUNITY): Payer: Self-pay | Admitting: Nurse Practitioner

## 2015-12-20 VITALS — BP 118/68 | HR 75 | Ht 64.0 in | Wt 173.2 lb

## 2015-12-20 DIAGNOSIS — I480 Paroxysmal atrial fibrillation: Secondary | ICD-10-CM | POA: Insufficient documentation

## 2015-12-20 MED ORDER — DILTIAZEM HCL 30 MG PO TABS: ORAL_TABLET | ORAL | Status: DC

## 2015-12-20 MED ORDER — FLECAINIDE ACETATE 150 MG PO TABS: ORAL_TABLET | ORAL | Status: DC

## 2015-12-20 NOTE — Patient Instructions (Signed)
Your physician has recommended you make the following change in your medication:   1)Cardizem 30mg -- take 1 tablet every 4 hours AS NEEDED for heart rate >100 as long as blood pressure >100.   

## 2015-12-20 NOTE — Progress Notes (Addendum)
Patient ID: Amber [name], female   DOB: Apr 03, 1946, 70 y.o.   MRN: [id number]     Primary Care Physician: Precious Reel, MD Referring Physician: Dr. Hulan Fray is a 70 y.o. female with a h/o PAF that recently saw Dr. Caryl Comes and discussed that she had been having more episodes of afib. She was against daily AAD's but was given flecainide 150 mg x2 tabs to take as pill in the pocket. She was asked to f/u in the afib clinic for success in using drug. However, pt has not used any of the flecainide as it scared her due to possibility of death as mentioned in the PI issued by the drugstore. She will have maybe 2 episodes a month which lasts around 1-2 hours. She has been taking prn metoprolol which is not that helpful. Labetalol was increased on last visit by Dr. Caryl Comes for BP issues and her BP today is managed.  Today, she denies symptoms of palpitations, chest pain, shortness of breath, orthopnea, PND, lower extremity edema, dizziness, presyncope, syncope, or neurologic sequela. The patient is tolerating medications without difficulties and is otherwise without complaint today.   Past Medical History  Diagnosis Date  . Chest pain     + Tn s/p cath 2012--Normal  . SOB (shortness of breath)   . HTN (hypertension)   . Palpitations     three types  . 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 Obesity   . History of tobacco abuse   . Post-menopausal   . Chest tightness   . Dizziness   . Right bundle branch block   . Hyperlipemia   . Abdominal mass     Pulsating  . Arthritis   . Osteoporosis    Past Surgical History  Procedure Laterality Date  . Breast reduction surgery      BILATERAL  . Carpal tunnel release    . Tonsillectomy and adenoidectomy  AGE 32  . Ovarian cyst removal  1978  . Cardiac catheterization  02-25-2011    EF 60%. Left coronary artery arises and distributes normal, right coronary artery is a dominant vessel and is normal  . US echocardiography  12-23-2006    EF  55-60%  . Cardiovascular stress test  08-27-2002    EF 76%  . Breast biopsy      left breast- benign  . Loop recorder explant N/A 12/28/2014    Procedure: LOOP RECORDER EXPLANT;  Surgeon: Deboraha Sprang, MD;  Location[facility];  Service: Cardiovascular;  Laterality: N/A;    Current Outpatient Prescriptions  Medication Sig Dispense Refill  . Cholecalciferol (VITAMIN D PO) Take 1 tablet by mouth daily.     Marland Kitchen ELIQUIS 5 MG TABS tablet TAKE 1 TABLET BY MOUTH TWICE DAILY 60 tablet 11  . esomeprazole (NEXIUM) 40 MG capsule Take 40 mg by mouth daily as needed (heartburn).     . fish oil-omega-3 fatty acids 1000 MG capsule Take 2 g by mouth daily with lunch.     . labetalol (NORMODYNE) 200 MG tablet Take two tablets (400 mg) by mouth twice daily    . losartan-hydrochlorothiazide (HYZAAR) 100-25 MG tablet Take 1 tablet by mouth daily. 90 tablet 3  . potassium chloride (K-DUR,KLOR-CON) 10 MEQ tablet Take 10 mEq by mouth daily.     . sertraline (ZOLOFT) 50 MG tablet Take 25 mg by mouth daily.     Marland Kitchen diltiazem (CARDIZEM) 30 MG tablet Cardizem 30mg  -- take  1 tablet every 4 hours AS NEEDED for heart rate >100 as long as blood pressure >100. 45 tablet 1  . flecainide (TAMBOCOR) 150 MG tablet Take 2 tablets (300mg ) for AFib every 72 hours as needed. 24 tablet 1   No current facility-administered medications for this encounter.    Allergies  Allergen Reactions  . Ciprofloxacin Itching and Swelling    Social History   Social History  . Marital Status: Married    Spouse Name: N/A  . Number of Children: N/A  . Years of Education: N/A   Occupational History  . Not on file.   Social History Main Topics  . Smoking status: Never Smoker   . Smokeless tobacco: Never Used  . Alcohol Use: 3.5 oz/week    7 drink(s) per week     Comment: Social alcohol  . Drug Use: No  . Sexual Activity: Not on file   Other Topics Concern  . Not on file   Social History Narrative    Family History  Problem  Relation Age of Onset  . Prostate cancer Father   . Heart failure Father   . Atrial fibrillation Mother   . Diabetes Father     ROS- All systems are reviewed and negative except as per the HPI above  Physical Exam: Filed Vitals:   12/20/15 1339  BP: 118/68  Pulse: 75  Height: 5\' 4"  (1.626 m)  Weight: 173 lb 3.2 oz (78.563 kg)    GEN- The patient is well appearing, alert and oriented x 3 today.   Head- normocephalic, atraumatic Eyes-  Sclera clear, conjunctiva pink Ears- hearing intact Oropharynx- clear Neck- supple, no JVP Lymph- no cervical lymphadenopathy Lungs- Clear to ausculation bilaterally, normal work of breathing Heart- Regular rate and rhythm, no murmurs, rubs or gallops, PMI not laterally displaced GI- soft, NT, ND, + BS Extremities- no clubbing, cyanosis, or edema MS- no significant deformity or atrophy Skin- no rash or lesion Psych- euthymic mood, full affect Neuro- strength and sensation are intact  EKG-Sinus rhythm, RAD, IRBBB, RVH, pr int 144 ms, qrs int 94 ms, qtc 453 ms Epic records reviewed.  Assessment and Plan: 1. PAF I tried to educate pt and get her to feel comfortable with flecainide PIP, but she is still "scared of drug." Her episodes of afib are not that frequent, and terminate fairly soon.  Will try cardizem 30 mg tablets for breakthrough episodes, instead of propanolol, and see if this is helpful. Continue labetalol  Continue eliquis  F/u in one month  Butch Penny C. Jayron Maqueda, [facility] [address] [place], [place] [address] [phone]

## 2015-12-20 NOTE — Addendum Note (Signed)
Encounter addended by: Sherran Needs, NP on: 12/20/2015  4:35 PM<BR>     Documentation filed: Notes Section

## 2015-12-22 DIAGNOSIS — I480 Paroxysmal atrial fibrillation: Secondary | ICD-10-CM | POA: Diagnosis not present

## 2015-12-22 DIAGNOSIS — E668 Other obesity: Secondary | ICD-10-CM | POA: Diagnosis not present

## 2015-12-22 DIAGNOSIS — K589 Irritable bowel syndrome without diarrhea: Secondary | ICD-10-CM | POA: Diagnosis not present

## 2015-12-22 DIAGNOSIS — M545 Low back pain: Secondary | ICD-10-CM | POA: Diagnosis not present

## 2015-12-22 DIAGNOSIS — Z6830 Body mass index (BMI) 30.0-30.9, adult: Secondary | ICD-10-CM | POA: Diagnosis not present

## 2015-12-22 DIAGNOSIS — Z1389 Encounter for screening for other disorder: Secondary | ICD-10-CM | POA: Diagnosis not present

## 2015-12-22 DIAGNOSIS — I10 Essential (primary) hypertension: Secondary | ICD-10-CM | POA: Diagnosis not present

## 2015-12-22 DIAGNOSIS — E784 Other hyperlipidemia: Secondary | ICD-10-CM | POA: Diagnosis not present

## 2015-12-22 DIAGNOSIS — F329 Major depressive disorder, single episode, unspecified: Secondary | ICD-10-CM | POA: Diagnosis not present

## 2015-12-22 DIAGNOSIS — R7301 Impaired fasting glucose: Secondary | ICD-10-CM | POA: Diagnosis not present

## 2016-01-05 DIAGNOSIS — M545 Low back pain: Secondary | ICD-10-CM | POA: Diagnosis not present

## 2016-01-05 DIAGNOSIS — M5187 Other intervertebral disc disorders, lumbosacral region: Secondary | ICD-10-CM | POA: Diagnosis not present

## 2016-01-05 DIAGNOSIS — M47816 Spondylosis without myelopathy or radiculopathy, lumbar region: Secondary | ICD-10-CM | POA: Diagnosis not present

## 2016-01-05 DIAGNOSIS — M438X6 Other specified deforming dorsopathies, lumbar region: Secondary | ICD-10-CM | POA: Diagnosis not present

## 2016-01-05 DIAGNOSIS — M4806 Spinal stenosis, lumbar region: Secondary | ICD-10-CM | POA: Diagnosis not present

## 2016-01-05 DIAGNOSIS — Z87891 Personal history of nicotine dependence: Secondary | ICD-10-CM | POA: Diagnosis not present

## 2016-01-05 DIAGNOSIS — M4316 Spondylolisthesis, lumbar region: Secondary | ICD-10-CM | POA: Diagnosis not present

## 2016-01-05 DIAGNOSIS — G8929 Other chronic pain: Secondary | ICD-10-CM | POA: Diagnosis not present

## 2016-01-09 DIAGNOSIS — M48061 Spinal stenosis, lumbar region without neurogenic claudication: Secondary | ICD-10-CM | POA: Insufficient documentation

## 2016-01-09 DIAGNOSIS — M4316 Spondylolisthesis, lumbar region: Secondary | ICD-10-CM | POA: Insufficient documentation

## 2016-01-09 DIAGNOSIS — M5136 Other intervertebral disc degeneration, lumbar region: Secondary | ICD-10-CM | POA: Insufficient documentation

## 2016-01-09 DIAGNOSIS — M545 Low back pain, unspecified: Secondary | ICD-10-CM | POA: Insufficient documentation

## 2016-01-09 DIAGNOSIS — G8929 Other chronic pain: Secondary | ICD-10-CM | POA: Insufficient documentation

## 2016-01-22 ENCOUNTER — Telehealth: Payer: Self-pay

## 2016-01-22 NOTE — Telephone Encounter (Signed)
Prior auth for Eliquis 5 mg sent to [facility]. 

## 2016-01-23 ENCOUNTER — Telehealth: Payer: Self-pay

## 2016-01-23 NOTE — Telephone Encounter (Signed)
Eliquis approved through 12/15/2016. PA- [id number].

## 2016-01-24 ENCOUNTER — Encounter (HOSPITAL_COMMUNITY): Payer: Self-pay | Admitting: Nurse Practitioner

## 2016-01-24 ENCOUNTER — Ambulatory Visit (HOSPITAL_COMMUNITY)
Admission: RE | Admit: 2016-01-24 | Discharge: 2016-01-24 | Disposition: A | Discharge status: Home / Self Care | Payer: Medicare Other | Source: Ambulatory Visit | Attending: Nurse Practitioner | Admitting: Nurse Practitioner

## 2016-01-24 VITALS — BP 132/74 | HR 72 | Ht 64.0 in | Wt 172.0 lb

## 2016-01-24 DIAGNOSIS — I4891 Unspecified atrial fibrillation: Secondary | ICD-10-CM | POA: Diagnosis present

## 2016-01-24 DIAGNOSIS — I4510 Unspecified right bundle-branch block: Secondary | ICD-10-CM | POA: Diagnosis not present

## 2016-01-24 DIAGNOSIS — Z79899 Other long term (current) drug therapy: Secondary | ICD-10-CM | POA: Insufficient documentation

## 2016-01-24 DIAGNOSIS — E785 Hyperlipidemia, unspecified: Secondary | ICD-10-CM | POA: Insufficient documentation

## 2016-01-24 DIAGNOSIS — Z6829 Body mass index (BMI) 29.0-29.9, adult: Secondary | ICD-10-CM | POA: Insufficient documentation

## 2016-01-24 DIAGNOSIS — I480 Paroxysmal atrial fibrillation: Secondary | ICD-10-CM | POA: Diagnosis not present

## 2016-01-24 DIAGNOSIS — Z881 Allergy status to other antibiotic agents status: Secondary | ICD-10-CM | POA: Diagnosis not present

## 2016-01-24 DIAGNOSIS — Z833 Family history of diabetes mellitus: Secondary | ICD-10-CM | POA: Diagnosis not present

## 2016-01-24 DIAGNOSIS — I10 Essential (primary) hypertension: Secondary | ICD-10-CM | POA: Insufficient documentation

## 2016-01-24 DIAGNOSIS — E669 Obesity, unspecified: Secondary | ICD-10-CM | POA: Diagnosis not present

## 2016-01-24 DIAGNOSIS — Z7902 Long term (current) use of antithrombotics/antiplatelets: Secondary | ICD-10-CM | POA: Insufficient documentation

## 2016-01-24 DIAGNOSIS — Z8249 Family history of ischemic heart disease and other diseases of the circulatory system: Secondary | ICD-10-CM | POA: Insufficient documentation

## 2016-01-24 NOTE — Progress Notes (Addendum)
Patient ID: Amber [name], female   DOB: [birth year], 70 y.o.   MRN: [id number]     Primary Care Physician: Precious Reel, MD Referring Physician: Dr. Hulan Fray is a 70 y.o. female with a h/o PAF that here for f/u. She reports that she got up the nerve to use prn flecainide and has used it twice with good results. She will have breakthrough afib a couple of times a week and reminded her not to use prn dose more than once every 4 days.If requires more often than that would be best if she goes on daily flecainide. For now, she is content with her current treatment. She does report that she will probably require back surgery in the near future.  Today, she denies symptoms of palpitations, chest pain, shortness of breath, orthopnea, PND, lower extremity edema, dizziness, presyncope, syncope, or neurologic sequela. The patient is tolerating medications without difficulties and is otherwise without complaint today.   Past Medical History  Diagnosis Date  . Chest pain     + Tn s/p cath 2012--Normal  . SOB (shortness of breath)   . HTN (hypertension)   . Palpitations     three types  . 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 Obesity   . History of tobacco abuse   . Post-menopausal   . Chest tightness   . Dizziness   . Right bundle branch block   . Hyperlipemia   . Abdominal mass     Pulsating  . Arthritis   . Osteoporosis    Past Surgical History  Procedure Laterality Date  . Breast reduction surgery      BILATERAL  . Carpal tunnel release    . Tonsillectomy and adenoidectomy  AGE 62  . Ovarian cyst removal  1978  . Cardiac catheterization  02-25-2011    EF 60%. Left coronary artery arises and distributes normal, right coronary artery is a dominant vessel and is normal  . US echocardiography  12-23-2006    EF 55-60%  . Cardiovascular stress test  08-27-2002    EF 76%  . Breast biopsy      left breast- benign  . Loop recorder explant N/A 12/28/2014    Procedure: LOOP  RECORDER EXPLANT;  Surgeon: Deboraha Sprang, MD;  Location: [facility] CATH LAB;  Service: Cardiovascular;  Laterality: N/A;    Current Outpatient Prescriptions  Medication Sig Dispense Refill  . Cholecalciferol (VITAMIN D PO) Take 1 tablet by mouth daily.     Marland Kitchen diltiazem (CARDIZEM) 30 MG tablet TAKE 1 TABLET BY MOUTH EVERY 4 HOURS AS NEEDED FOR HEART RATE GREATER THAN 100 AS LONG AS BLOOD PRESSURE IS GREATER THAN 100 45 tablet 1  . ELIQUIS 5 MG TABS tablet TAKE 1 TABLET BY MOUTH TWICE DAILY 60 tablet 11  . esomeprazole (NEXIUM) 40 MG capsule Take 40 mg by mouth daily as needed (heartburn).     . fish oil-omega-3 fatty acids 1000 MG capsule Take 2 g by mouth daily with lunch.     . flecainide (TAMBOCOR) 150 MG tablet Take 2 tablets (300mg ) for AFib every 72 hours as needed. 24 tablet 1  . labetalol (NORMODYNE) 200 MG tablet Take two tablets (400 mg) by mouth twice daily    . losartan-hydrochlorothiazide (HYZAAR) 100-25 MG tablet Take 1 tablet by mouth daily. 90 tablet 3  . potassium chloride (K-DUR,KLOR-CON) 10 MEQ tablet Take 10 mEq by mouth daily.     . sertraline (ZOLOFT)  50 MG tablet Take 25 mg by mouth daily.      No current facility-administered medications for this encounter.    Allergies  Allergen Reactions  . Ciprofloxacin Itching and Swelling    Social History   Social History  . Marital Status: Married    Spouse Name: N/A  . Number of Children: N/A  . Years of Education: N/A   Occupational History  . Not on file.   Social History Main Topics  . Smoking status: Never Smoker   . Smokeless tobacco: Never Used  . Alcohol Use: 3.5 oz/week    7 drink(s) per week     Comment: Social alcohol  . Drug Use: No  . Sexual Activity: Not on file   Other Topics Concern  . Not on file   Social History Narrative    Family History  Problem Relation Age of Onset  . Prostate cancer Father   . Heart failure Father   . Atrial fibrillation Mother   . Diabetes Father     ROS- All  systems are reviewed and negative except as per the HPI above  Physical Exam: Filed Vitals:   01/24/16 1330  BP: 132/74  Pulse: 72  Height: 5\' 4"  (1.626 m)  Weight: 172 lb (78.019 kg)    GEN- The patient is well appearing, alert and oriented x 3 today.   Head- normocephalic, atraumatic Eyes-  Sclera clear, conjunctiva pink Ears- hearing intact Oropharynx- clear Neck- supple, no JVP Lymph- no cervical lymphadenopathy Lungs- Clear to ausculation bilaterally, normal work of breathing Heart- Regular rate and rhythm, no murmurs, rubs or gallops, PMI not laterally displaced GI- soft, NT, ND, + BS Extremities- no clubbing, cyanosis, or edema MS- no significant deformity or atrophy Skin- no rash or lesion Psych- euthymic mood, full affect Neuro- strength and sensation are intact  EKG- NSR  With RAD, IRBBB, pr int 154 ms, Qrs 100 ms, qtc 438 ms Epic records reviewed  Assessment and Plan: 1. PAF In SR with occasional bouts of afib with good response  from pill in pocket flecainide Reminded she is not to use more than every 4 days and is she feels afib burden is increasing, may be better to use daily flecainide.  Continue labetalol  Continue eliquis  2. HTN  Stable  F/u in afib clinic in 3 months  Butch Penny C. Sandip Power, [facility] [address] [place], [place] [address] [phone]

## 2016-01-29 DIAGNOSIS — H35361 Drusen (degenerative) of macula, right eye: Secondary | ICD-10-CM | POA: Diagnosis not present

## 2016-01-29 DIAGNOSIS — H43813 Vitreous degeneration, bilateral: Secondary | ICD-10-CM | POA: Diagnosis not present

## 2016-02-07 DIAGNOSIS — Z981 Arthrodesis status: Secondary | ICD-10-CM | POA: Diagnosis not present

## 2016-02-07 DIAGNOSIS — M545 Low back pain: Secondary | ICD-10-CM | POA: Diagnosis not present

## 2016-02-07 DIAGNOSIS — M4806 Spinal stenosis, lumbar region: Secondary | ICD-10-CM | POA: Diagnosis not present

## 2016-02-07 DIAGNOSIS — M4316 Spondylolisthesis, lumbar region: Secondary | ICD-10-CM | POA: Diagnosis not present

## 2016-02-07 DIAGNOSIS — M4125 Other idiopathic scoliosis, thoracolumbar region: Secondary | ICD-10-CM | POA: Diagnosis not present

## 2016-02-07 DIAGNOSIS — M419 Scoliosis, unspecified: Secondary | ICD-10-CM | POA: Diagnosis not present

## 2016-02-07 DIAGNOSIS — M47815 Spondylosis without myelopathy or radiculopathy, thoracolumbar region: Secondary | ICD-10-CM | POA: Diagnosis not present

## 2016-02-07 DIAGNOSIS — M439 Deforming dorsopathy, unspecified: Secondary | ICD-10-CM | POA: Diagnosis not present

## 2016-02-07 DIAGNOSIS — M40204 Unspecified kyphosis, thoracic region: Secondary | ICD-10-CM | POA: Diagnosis not present

## 2016-02-07 DIAGNOSIS — Z87891 Personal history of nicotine dependence: Secondary | ICD-10-CM | POA: Diagnosis not present

## 2016-02-07 DIAGNOSIS — M5126 Other intervertebral disc displacement, lumbar region: Secondary | ICD-10-CM | POA: Diagnosis not present

## 2016-02-07 DIAGNOSIS — M47817 Spondylosis without myelopathy or radiculopathy, lumbosacral region: Secondary | ICD-10-CM | POA: Diagnosis not present

## 2016-02-07 DIAGNOSIS — M47812 Spondylosis without myelopathy or radiculopathy, cervical region: Secondary | ICD-10-CM | POA: Diagnosis not present

## 2016-02-21 ENCOUNTER — Other Ambulatory Visit (HOSPITAL_COMMUNITY): Payer: Self-pay | Admitting: *Deleted

## 2016-02-21 DIAGNOSIS — J324 Chronic pansinusitis: Secondary | ICD-10-CM | POA: Insufficient documentation

## 2016-02-21 DIAGNOSIS — J343 Hypertrophy of nasal turbinates: Secondary | ICD-10-CM | POA: Diagnosis not present

## 2016-02-21 DIAGNOSIS — K219 Gastro-esophageal reflux disease without esophagitis: Secondary | ICD-10-CM | POA: Insufficient documentation

## 2016-02-21 DIAGNOSIS — R1310 Dysphagia, unspecified: Secondary | ICD-10-CM | POA: Diagnosis not present

## 2016-02-21 DIAGNOSIS — R51 Headache: Secondary | ICD-10-CM | POA: Diagnosis not present

## 2016-02-21 DIAGNOSIS — J329 Chronic sinusitis, unspecified: Secondary | ICD-10-CM | POA: Diagnosis not present

## 2016-02-21 DIAGNOSIS — J387 Other diseases of larynx: Secondary | ICD-10-CM | POA: Diagnosis not present

## 2016-02-21 DIAGNOSIS — R519 Headache, unspecified: Secondary | ICD-10-CM | POA: Insufficient documentation

## 2016-02-21 MED ORDER — FLECAINIDE ACETATE 50 MG PO TABS: 50.0000 mg | ORAL_TABLET | Freq: Two times a day (BID) | ORAL | Status: DC

## 2016-02-21 NOTE — Telephone Encounter (Signed)
Pt states she is having more afib burden and is planning to have back surgery on 3/20. Has incomplete RBBB discussed with Dr. Rayann Heman will start flecainide 50mg  twice a day. Instructed she cannot take PRN dosing of flecainide along with daily flecainide and pt understands this. RX sent to pharmacy of choice and follow up appt made for Monday.

## 2016-02-26 ENCOUNTER — Encounter (HOSPITAL_COMMUNITY): Payer: Self-pay | Admitting: Nurse Practitioner

## 2016-02-26 ENCOUNTER — Ambulatory Visit (HOSPITAL_COMMUNITY)
Admission: RE | Admit: 2016-02-26 | Discharge: 2016-02-26 | Disposition: A | Discharge status: Home / Self Care | Payer: Medicare Other | Source: Ambulatory Visit | Attending: Nurse Practitioner | Admitting: Nurse Practitioner

## 2016-02-26 VITALS — BP 102/60 | HR 117 | Ht 64.0 in | Wt 169.4 lb

## 2016-02-26 DIAGNOSIS — Z881 Allergy status to other antibiotic agents status: Secondary | ICD-10-CM | POA: Diagnosis not present

## 2016-02-26 DIAGNOSIS — E785 Hyperlipidemia, unspecified: Secondary | ICD-10-CM | POA: Diagnosis not present

## 2016-02-26 DIAGNOSIS — I4510 Unspecified right bundle-branch block: Secondary | ICD-10-CM | POA: Insufficient documentation

## 2016-02-26 DIAGNOSIS — E669 Obesity, unspecified: Secondary | ICD-10-CM | POA: Insufficient documentation

## 2016-02-26 DIAGNOSIS — Z79899 Other long term (current) drug therapy: Secondary | ICD-10-CM | POA: Diagnosis not present

## 2016-02-26 DIAGNOSIS — Z7901 Long term (current) use of anticoagulants: Secondary | ICD-10-CM | POA: Insufficient documentation

## 2016-02-26 DIAGNOSIS — I4891 Unspecified atrial fibrillation: Secondary | ICD-10-CM | POA: Diagnosis not present

## 2016-02-26 DIAGNOSIS — Z6829 Body mass index (BMI) 29.0-29.9, adult: Secondary | ICD-10-CM | POA: Diagnosis not present

## 2016-02-26 DIAGNOSIS — Z8249 Family history of ischemic heart disease and other diseases of the circulatory system: Secondary | ICD-10-CM | POA: Insufficient documentation

## 2016-02-26 DIAGNOSIS — Z833 Family history of diabetes mellitus: Secondary | ICD-10-CM | POA: Diagnosis not present

## 2016-02-26 DIAGNOSIS — I480 Paroxysmal atrial fibrillation: Secondary | ICD-10-CM

## 2016-02-27 ENCOUNTER — Encounter (HOSPITAL_COMMUNITY): Payer: Self-pay | Admitting: Nurse Practitioner

## 2016-02-27 NOTE — Progress Notes (Signed)
Patient ID: Amber [name], female   DOB: June 03, 1946, 70 y.o.   MRN: [id number]     Primary Care Physician: Precious Reel, MD Referring Physician: Dr. Hulan Fray is a 70 y.o. female with a h/o afib that has seen Dr. Caryl Comes often in the past and at one point had a LinQ monitor that was removed after battery life expired. She has had increasing frequency of afib over the yers. She was seen by Dr. Caryl Comes in November and was given pill in pocket Flecainide 150 mg x 2 pills  As needed to break afib. She did not have the nerve to use for the first part, but over the course of the last two months has been using the drug. She often will have breakthrough afib several times a week and last week, she called the office and reported this and was told to try flecainide 50 mg bid and come in for EKG after one week.   Today, she is in afib, she will usually convert on her own after 12-24 hours. She has an IRBBB at baseline and does now show RBBB with 50 mg flecainide on board. She has pending back surgery in one week but she has made up her mind to cancel this and put it off for a while, until her afib can be better managed and it also makes her nervous the surgeon wants her off blood thinner for 3-4 weeks. She has a chadsvasc score of 3.   Today, she denies symptoms of chest pain,  orthopnea, PND, lower extremity edema,  presyncope, syncope, or neurologic sequela. She does c/o of lightheadedness, palpitations and fatigue with afib. The patient is tolerating medications without difficulties and is otherwise without complaint today.   Past Medical History  Diagnosis Date  . Chest pain     + Tn s/p cath 2012--Normal  . SOB (shortness of breath)   . HTN (hypertension)   . Palpitations     three types  . 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 Obesity   . History of tobacco abuse   . Post-menopausal   . Chest tightness   . Dizziness   . Right bundle branch block   . Hyperlipemia   . Abdominal mass      Pulsating  . Arthritis   . Osteoporosis    Past Surgical History  Procedure Laterality Date  . Breast reduction surgery      BILATERAL  . Carpal tunnel release    . Tonsillectomy and adenoidectomy  AGE 27  . Ovarian cyst removal  1978  . Cardiac catheterization  02-25-2011    EF 60%. Left coronary artery arises and distributes normal, right coronary artery is a dominant vessel and is normal  . US echocardiography  12-23-2006    EF 55-60%  . Cardiovascular stress test  08-27-2002    EF 76%  . Breast biopsy      left breast- benign  . Loop recorder explant N/A 12/28/2014    Procedure: LOOP RECORDER EXPLANT;  Surgeon: Deboraha Sprang, MD;  Location: [facility] CATH LAB;  Service: Cardiovascular;  Laterality: N/A;    Current Outpatient Prescriptions  Medication Sig Dispense Refill  . Cholecalciferol (VITAMIN D PO) Take 1 tablet by mouth daily.     Marland Kitchen diltiazem (CARDIZEM) 30 MG tablet TAKE 1 TABLET BY MOUTH EVERY 4 HOURS AS NEEDED FOR HEART RATE GREATER THAN 100 AS LONG AS BLOOD PRESSURE IS GREATER THAN 100 45  tablet 1  . ELIQUIS 5 MG TABS tablet TAKE 1 TABLET BY MOUTH TWICE DAILY 60 tablet 11  . esomeprazole (NEXIUM) 40 MG capsule Take 40 mg by mouth daily as needed (heartburn).     . fish oil-omega-3 fatty acids 1000 MG capsule Take 2 g by mouth daily with lunch.     . flecainide (TAMBOCOR) 50 MG tablet Take 1 tablet (50 mg total) by mouth 2 (two) times daily. 60 tablet 3  . labetalol (NORMODYNE) 200 MG tablet Take two tablets (400 mg) by mouth twice daily    . losartan-hydrochlorothiazide (HYZAAR) 100-25 MG tablet Take 1 tablet by mouth daily. 90 tablet 3  . potassium chloride (K-DUR,KLOR-CON) 10 MEQ tablet Take 10 mEq by mouth daily.     . rosuvastatin (CRESTOR) 5 MG tablet Take 5 mg by mouth every other day.    . sertraline (ZOLOFT) 50 MG tablet Take 25 mg by mouth daily.      No current facility-administered medications for this encounter.    Allergies  Allergen Reactions  .  Ciprofloxacin Itching and Swelling    Social History   Social History  . Marital Status: Married    Spouse Name: N/A  . Number of Children: N/A  . Years of Education: N/A   Occupational History  . Not on file.   Social History Main Topics  . Smoking status: Never Smoker   . Smokeless tobacco: Never Used  . Alcohol Use: 3.5 oz/week    7 drink(s) per week     Comment: Social alcohol  . Drug Use: No  . Sexual Activity: Not on file   Other Topics Concern  . Not on file   Social History Narrative    Family History  Problem Relation Age of Onset  . Prostate cancer Father   . Heart failure Father   . Atrial fibrillation Mother   . Diabetes Father     ROS- All systems are reviewed and negative except as per the HPI above  Physical Exam: Filed Vitals:   02/26/16 0943  BP: 102/60  Pulse: 117  Height: 5\' 4"  (1.626 m)  Weight: 169 lb 6.4 oz (76.839 kg)    GEN- The patient is well appearing, alert and oriented x 3 today.   Head- normocephalic, atraumatic Eyes-  Sclera clear, conjunctiva pink Ears- hearing intact Oropharynx- clear Neck- supple, no JVP Lymph- no cervical lymphadenopathy Lungs- Clear to ausculation bilaterally, normal work of breathing Heart-Irregular rate and rhythm, no murmurs, rubs or gallops, PMI not laterally displaced GI- soft, NT, ND, + BS Extremities- no clubbing, cyanosis, or edema MS- no significant deformity or atrophy Skin- no rash or lesion Psych- euthymic mood, full affect Neuro- strength and sensation are intact  EKG- afib with rvr at 117 bpm, qrs int 108 ms, , qtc 477 ms, RBBB Epic records reviewed   Assessment and Plan: 1. Symptomatic afib with increasing afib burden with failure of flecainide to maintain SR Discussed with Dr. Caryl Comes I am concerned with increasing dose of flecainide with IRBBB at baseline and now with RBBB and he agrees Will continue flecainide 50 mg bid for now Review of EKG's shows qtc over 440 mg at baseline  in SR. Pt is on zoloft and thinks she would be ok to wean off but Dr. Caryl Comes still has some concern for her prolonged qtc. He would like her to see Dr. Rayann Heman for possible ablation and pt is in agreement.  I offered to stop flecainide and start short  term amiodarone to bridge to ablation and pt defers at this time. Continue eliquis 5 mg bid for chadsvasc score of 3( age/female/htn) Pt plans to defer back surgery until afib is treated  Appointment made with Dr. Rayann Heman for march 27th at 11:15.  Geroge Baseman Cylus [place], [facility] [address] [place], [place] [address] [phone]

## 2016-03-05 DIAGNOSIS — M7071 Other bursitis of hip, right hip: Secondary | ICD-10-CM | POA: Diagnosis not present

## 2016-03-05 DIAGNOSIS — M7072 Other bursitis of hip, left hip: Secondary | ICD-10-CM | POA: Diagnosis not present

## 2016-03-11 ENCOUNTER — Encounter: Payer: Self-pay | Admitting: Internal Medicine

## 2016-03-11 ENCOUNTER — Ambulatory Visit (INDEPENDENT_AMBULATORY_CARE_PROVIDER_SITE_OTHER): Payer: Medicare Other | Admitting: Internal Medicine

## 2016-03-11 VITALS — BP 92/64 | HR 112 | Ht 64.0 in | Wt 167.8 lb

## 2016-03-11 DIAGNOSIS — I10 Essential (primary) hypertension: Secondary | ICD-10-CM

## 2016-03-11 DIAGNOSIS — I480 Paroxysmal atrial fibrillation: Secondary | ICD-10-CM

## 2016-03-11 MED ORDER — AMIODARONE HCL 200 MG PO TABS: 200.0000 mg | ORAL_TABLET | Freq: Two times a day (BID) | ORAL | Status: DC

## 2016-03-11 MED ORDER — METOPROLOL SUCCINATE ER 100 MG PO TB24: 100.0000 mg | ORAL_TABLET | Freq: Every day | ORAL | Status: DC

## 2016-03-11 NOTE — Patient Instructions (Addendum)
Medication Instructions:  Your physician has recommended you make the following change in your medication:  1) Stop Flecainide 2) On 03/14/16 Start Amiodarone 200 mg twice daily 3) Stop Labetolol 4) Start Toprol 100 mg daily  Labwork: None ordered   Testing/Procedures: Your physician has requested that you have an echocardiogram. Echocardiography is a painless test that uses sound waves to create images of your heart. It provides your doctor with information about the size and shape of your heart and how well your heart's chambers and valves are working. This procedure takes approximately one hour. There are no restrictions for this procedure.   Follow-Up:  Your physician recommends that you schedule a follow-up appointment in: 4 weeks with Amber Palau, NP and 3 months with Dr Amber [name]   Any Other Special Instructions Will Be Listed Below (If Applicable).     If you need a refill on your cardiac medications before your next appointment, please call your pharmacy.

## 2016-03-11 NOTE — Progress Notes (Signed)
Electrophysiology Office Note   Date:  03/11/2016   ID:  Amber [name], DOB 1946/11/15, MRN [id number]  PCP:  Precious Reel, MD  Primary Electrophysiologist: Dr Caryl Comes  Chief Complaint  Patient presents with  . Atrial Fibrillation    pt states no chest pain  . Shortness of Breath    only when in AFIB     History of Present Illness: Amber [name] is a 70 y.o. female who presents today for electrophysiology evaluation.   She initially presented to Dr Caryl Comes with rare palpitations.  She had an implantable loop recorder placed in 2013 which documented atrial fibrillation as the cause.  Per the patient, no other arrhythmias were seen.  She was placed on eliquis and metoprolol.  Eventually metoprolol was switched to lebatolol due to elevations in BP.   She was also given flecainide as a pill in pocket approach.  Her afib increased in frequency to weekly.  Her flecainide was changed to 50mg  BID without improvement.  She was not offered a higher dose due to her RBBB.  She now feels that she is in atrial fibrillation the majority of the time.   During atrial fibrillation she has palpitations and fatigue.  She has had labile blood pressures.  After taking her morning medicines, she finds that her blood pressure is very low.  She has dizziness with this.  Today, she denies symptoms of palpitations, chest pain, shortness of breath, orthopnea, PND, lower extremity edema, claudication, dizziness, presyncope, syncope, bleeding, or neurologic sequela. The patient is tolerating medications without difficulties and is otherwise without complaint today.    Past Medical History  Diagnosis Date  . Chest pain     + Tn s/p cath 2012--Normal  . HTN (hypertension)   . Persistent atrial fibrillation (HCC)     three types  . Obesity   . History of tobacco abuse   . Post-menopausal   . Right bundle branch block   . Hyperlipemia   . Abdominal mass     pt unaware  . Arthritis   . Osteoporosis   . IBS  (irritable bowel syndrome)    Past Surgical History  Procedure Laterality Date  . Breast reduction surgery      BILATERAL  . Carpal tunnel release    . Tonsillectomy and adenoidectomy  AGE 51  . Ovarian cyst removal  1978  . Cardiac catheterization  02-25-2011    EF 60%. Left coronary artery arises and distributes normal, right coronary artery is a dominant vessel and is normal  . US echocardiography  12-23-2006    EF 55-60%  . Cardiovascular stress test  08-27-2002    EF 76%  . Breast biopsy      left breast- benign  . Loop recorder explant N/A 12/28/2014    Procedure: LOOP RECORDER EXPLANT;  Surgeon: Deboraha Sprang, MD     Current Outpatient Prescriptions  Medication Sig Dispense Refill  . Cholecalciferol (VITAMIN D PO) Take 1 tablet by mouth daily.     Marland Kitchen ELIQUIS 5 MG TABS tablet TAKE 1 TABLET BY MOUTH TWICE DAILY 60 tablet 11  . esomeprazole (NEXIUM) 40 MG capsule Take 40 mg by mouth daily as needed (heartburn).     . fish oil-omega-3 fatty acids 1000 MG capsule Take 2 g by mouth daily with lunch.     . flecainide (TAMBOCOR) 50 MG tablet Take 1 tablet (50 mg total) by mouth 2 (two) times daily. 60 tablet 3  . labetalol (NORMODYNE)  200 MG tablet Take two tablets (400 mg) by mouth twice daily    . losartan-hydrochlorothiazide (HYZAAR) 100-25 MG tablet Take 1 tablet by mouth daily. 90 tablet 3  . potassium chloride (K-DUR,KLOR-CON) 10 MEQ tablet Take 10 mEq by mouth daily.     . rosuvastatin (CRESTOR) 5 MG tablet Take 5 mg by mouth every other day.     No current facility-administered medications for this visit.    Allergies:   Ciprofloxacin   Social History:  The patient  reports that she has never smoked. She has never used smokeless tobacco. She reports that she drinks about 4.2 oz of alcohol per week. She reports that she does not use illicit drugs.   Family History:  The patient's  family history includes Atrial fibrillation in her mother; Diabetes in her father; Heart failure  in her father; Prostate cancer in her father.    ROS:  Please see the history of present illness.   All other systems are reviewed and negative.    PHYSICAL EXAM: VS:  BP 92/64 mmHg  Pulse 112  Ht 5\' 4"  (1.626 m)  Wt 167 lb 12.8 oz (76.114 kg)  BMI 28.79 kg/m2 , BMI Body mass index is 28.79 kg/(m^2). GEN: Well nourished, well developed, in no acute distress HEENT: normal Neck: no JVD, carotid bruits, or masses Cardiac: iRRR; no murmurs, rubs, or gallops,no edema  Respiratory:  clear to auscultation bilaterally, normal work of breathing GI: soft, nontender, nondistended, + BS MS: no deformity or atrophy Skin: warm and dry  Neuro:  Strength and sensation are intact Psych: euthymic mood, full affect  EKG:  EKG is ordered today. The ekg ordered today shows afib with coarse fib waves, V rate 112 bpm, RBBB, nonspecific ST/T changes   Lipid Panel     Component Value Date/Time   CHOL 192 04/15/2012 1004   TRIG 61.0 04/15/2012 1004   HDL 93.70 04/15/2012 1004   CHOLHDL 2 04/15/2012 1004   VLDL 12.2 04/15/2012 1004   LDLCALC 86 04/15/2012 1004     Wt Readings from Last 3 Encounters:  03/11/16 167 lb 12.8 oz (76.114 kg)  02/26/16 169 lb 6.4 oz (76.839 kg)  01/24/16 172 lb (78.019 kg)      Other studies Reviewed: Additional studies/ records that were reviewed today include: AF clinic notes, prior ekgs, prior echo, Dr Aquilla Hacker notes   ASSESSMENT AND PLAN:  1.  Persistent afib The patient has increasing frequency and duration of afib and appears to be in afib all of the time presently. Therapeutic strategies for afib including medicine (tikosyn/ amiodarone) and ablation were discussed in detail with the patient today. Risk, benefits, and alternatives to EP study and radiofrequency ablation for afib were also discussed in detail today.  She is clear that she would like to avoid this.  Risks and benefits of amiodarone were discussed at length.  She understands that risks including  liver, thyroid, lung, ocular toxicity and possibly even death.  She accepts these risks and wishes to proceed. I will therefore stop flecainide and start amiodarone 200mg  BID after 48 hour washout. She will follow-up with Butch Penny in the AF clinic in 4 weeks. If she remains in afib at that time then she will require cardioversion. Continue long term anticoagulation Follow-up with Dr Caryl Comes in 3 months  2. HTN Frequent daytime hypotension with symptoms Requests that we stop labetolol She was previously on toprol 100mg  daily.  I will therefore stop labetalol and resume toprol XL 100mg   daily at this time.  Current medicines are reviewed at length with the patient today.   The patient does not have concerns regarding her medicines.  The following changes were made today:  none  Signed, Thompson Grayer, MD  03/11/2016 11:47 AM     [facility] [address] [place] [address] [phone] (office) [phone] (fax)

## 2016-03-13 ENCOUNTER — Telehealth: Payer: Self-pay | Admitting: Internal Medicine

## 2016-03-13 DIAGNOSIS — I4891 Unspecified atrial fibrillation: Secondary | ICD-10-CM

## 2016-03-13 NOTE — Telephone Encounter (Signed)
Left message on machine to return call. 

## 2016-03-13 NOTE — Telephone Encounter (Signed)
New Message  Pt c/o medication issue:  Pt called to tell that she decided against the medication and would rather do the ablation any time starting April 19th. Also, Please call back to discuss her BP. She is off of the two meds discussed but her BP is now very very low. She states that she is still taking the losartan. Please call back to discus

## 2016-03-13 NOTE — Telephone Encounter (Signed)
[phone] am pt returned call to Elsie call [phone]

## 2016-03-13 NOTE — Telephone Encounter (Signed)
Spoke with patient and will discuss with Dr Rayann Heman.  Discussed with Dr Rayann Heman okay to schedule

## 2016-03-15 ENCOUNTER — Other Ambulatory Visit: Payer: Self-pay

## 2016-03-15 ENCOUNTER — Ambulatory Visit (HOSPITAL_COMMUNITY): Payer: Medicare Other | Attending: Cardiology

## 2016-03-15 ENCOUNTER — Encounter: Payer: Self-pay | Admitting: *Deleted

## 2016-03-15 ENCOUNTER — Other Ambulatory Visit (INDEPENDENT_AMBULATORY_CARE_PROVIDER_SITE_OTHER): Payer: Medicare Other | Admitting: *Deleted

## 2016-03-15 DIAGNOSIS — I272 Other secondary pulmonary hypertension: Secondary | ICD-10-CM | POA: Insufficient documentation

## 2016-03-15 DIAGNOSIS — I493 Ventricular premature depolarization: Secondary | ICD-10-CM | POA: Diagnosis not present

## 2016-03-15 DIAGNOSIS — I119 Hypertensive heart disease without heart failure: Secondary | ICD-10-CM | POA: Insufficient documentation

## 2016-03-15 DIAGNOSIS — I358 Other nonrheumatic aortic valve disorders: Secondary | ICD-10-CM | POA: Diagnosis not present

## 2016-03-15 DIAGNOSIS — I340 Nonrheumatic mitral (valve) insufficiency: Secondary | ICD-10-CM | POA: Diagnosis not present

## 2016-03-15 DIAGNOSIS — I4510 Unspecified right bundle-branch block: Secondary | ICD-10-CM | POA: Diagnosis not present

## 2016-03-15 DIAGNOSIS — I4891 Unspecified atrial fibrillation: Secondary | ICD-10-CM | POA: Diagnosis not present

## 2016-03-15 DIAGNOSIS — I480 Paroxysmal atrial fibrillation: Secondary | ICD-10-CM

## 2016-03-15 DIAGNOSIS — I371 Nonrheumatic pulmonary valve insufficiency: Secondary | ICD-10-CM | POA: Diagnosis not present

## 2016-03-15 LAB — CBC WITH DIFFERENTIAL/PLATELET
Basophils Absolute: 0 10*3/uL (ref 0.0–0.1)
Basophils Relative: 0 % (ref 0–1)
EOS PCT: 1 % (ref 0–5)
Eosinophils Absolute: 0.1 10*3/uL (ref 0.0–0.7)
HEMATOCRIT: 39.7 % (ref 36.0–46.0)
Hemoglobin: 13.5 g/dL (ref 12.0–15.0)
LYMPHS ABS: 1 10*3/uL (ref 0.7–4.0)
Lymphocytes Relative: 18 % (ref 12–46)
MCH: 33.3 pg (ref 26.0–34.0)
MCHC: 34 g/dL (ref 30.0–36.0)
MCV: 97.8 fL (ref 78.0–100.0)
MONO ABS: 0.4 10*3/uL (ref 0.1–1.0)
MPV: 9.8 fL (ref 8.6–12.4)
Monocytes Relative: 8 % (ref 3–12)
Neutro Abs: 4 10*3/uL (ref 1.7–7.7)
Neutrophils Relative %: 73 % (ref 43–77)
Platelets: 315 10*3/uL (ref 150–400)
RBC: 4.06 MIL/uL (ref 3.87–5.11)
RDW: 12.6 % (ref 11.5–15.5)
WBC: 5.5 10*3/uL (ref 4.0–10.5)

## 2016-03-15 LAB — BASIC METABOLIC PANEL
BUN: 17 mg/dL (ref 7–25)
CALCIUM: 9.1 mg/dL (ref 8.6–10.4)
CHLORIDE: 90 mmol/L — AB (ref 98–110)
CO2: 26 mmol/L (ref 20–31)
CREATININE: 0.94 mg/dL — AB (ref 0.60–0.93)
Glucose, Bld: 112 mg/dL — ABNORMAL HIGH (ref 65–99)
Potassium: 3.9 mmol/L (ref 3.5–5.3)
Sodium: 128 mmol/L — ABNORMAL LOW (ref 135–146)

## 2016-03-15 NOTE — Telephone Encounter (Signed)
Labs and echo today at 2 pm TEE 4/5 at 11 Case # [id number] afib 4/6 at [id number] precert sent

## 2016-03-16 ENCOUNTER — Encounter: Payer: Self-pay | Admitting: Internal Medicine

## 2016-03-18 ENCOUNTER — Telehealth: Payer: Self-pay | Admitting: Internal Medicine

## 2016-03-18 NOTE — Telephone Encounter (Addendum)
TEE/AFib ablation scheduled for Thursday 4/6. States she had a bad experience in the past w/ endoscopy and wants to make sure she is "out completely" for TEE.  . States she is not sure she can go through with this if she can't be "out". She asked about getting cardiac CT instead of TEE, but explained to the patient that at this late of date (3 days before procedure) we could not accomplish this. She would like anesthesia w/ TEE since she has it with ablation, but I explained that TEE was done in endo and anesthesia is arrange for EP ablation lab.  She would like to know if there is a way to do w/ anesthesia or assurance that she is "knocked out". She understands Claiborne Billings, RN will call her tomorrow to discuss these concerns.

## 2016-03-18 NOTE — Telephone Encounter (Signed)
New message      Talk to a nurse about her TEE and ablation scheduled for this week.

## 2016-03-19 NOTE — Telephone Encounter (Signed)
Responded to patient vis email as she had email in [place] also

## 2016-03-20 ENCOUNTER — Ambulatory Visit (HOSPITAL_COMMUNITY)
Admission: RE | Admit: 2016-03-20 | Discharge: 2016-03-20 | Disposition: A | Discharge status: Home / Self Care | Payer: Medicare Other | Source: Ambulatory Visit | Attending: Cardiovascular Disease | Admitting: Cardiovascular Disease

## 2016-03-20 ENCOUNTER — Encounter (HOSPITAL_COMMUNITY)
Admission: RE | Disposition: A | Discharge status: Home / Self Care | Payer: Self-pay | Source: Ambulatory Visit | Attending: Cardiovascular Disease

## 2016-03-20 ENCOUNTER — Encounter (HOSPITAL_COMMUNITY): Payer: Self-pay | Admitting: Cardiovascular Disease

## 2016-03-20 ENCOUNTER — Ambulatory Visit (HOSPITAL_COMMUNITY): Admission: RE | Admit: 2016-03-20 | Payer: Medicare Other | Source: Ambulatory Visit | Admitting: Cardiovascular Disease

## 2016-03-20 DIAGNOSIS — I481 Persistent atrial fibrillation: Secondary | ICD-10-CM | POA: Insufficient documentation

## 2016-03-20 DIAGNOSIS — I4891 Unspecified atrial fibrillation: Secondary | ICD-10-CM | POA: Diagnosis present

## 2016-03-20 DIAGNOSIS — Z7901 Long term (current) use of anticoagulants: Secondary | ICD-10-CM | POA: Insufficient documentation

## 2016-03-20 DIAGNOSIS — Z5309 Procedure and treatment not carried out because of other contraindication: Secondary | ICD-10-CM | POA: Diagnosis not present

## 2016-03-20 DIAGNOSIS — I10 Essential (primary) hypertension: Secondary | ICD-10-CM | POA: Insufficient documentation

## 2016-03-20 DIAGNOSIS — Z6828 Body mass index (BMI) 28.0-28.9, adult: Secondary | ICD-10-CM | POA: Insufficient documentation

## 2016-03-20 DIAGNOSIS — E785 Hyperlipidemia, unspecified: Secondary | ICD-10-CM | POA: Diagnosis not present

## 2016-03-20 DIAGNOSIS — Z79899 Other long term (current) drug therapy: Secondary | ICD-10-CM | POA: Diagnosis not present

## 2016-03-20 DIAGNOSIS — E669 Obesity, unspecified: Secondary | ICD-10-CM | POA: Insufficient documentation

## 2016-03-20 HISTORY — PX: TEE WITHOUT CARDIOVERSION: SHX5443

## 2016-03-20 SURGERY — ECHOCARDIOGRAM, TRANSESOPHAGEAL
Anesthesia: Moderate Sedation

## 2016-03-20 MED ORDER — FENTANYL CITRATE (PF) 100 MCG/2ML IJ SOLN
INTRAMUSCULAR | Status: AC
  Filled 2016-03-20: qty 2

## 2016-03-20 MED ORDER — SODIUM CHLORIDE 0.9 % IV SOLN: INTRAVENOUS | Status: DC

## 2016-03-20 MED ORDER — MIDAZOLAM HCL 5 MG/ML IJ SOLN
INTRAMUSCULAR | Status: AC
  Filled 2016-03-20: qty 1

## 2016-03-20 MED ORDER — MIDAZOLAM HCL 5 MG/ML IJ SOLN
INTRAMUSCULAR | Status: AC
  Filled 2016-03-20: qty 2

## 2016-03-20 MED ORDER — FENTANYL CITRATE (PF) 100 MCG/2ML IJ SOLN
INTRAMUSCULAR | Status: DC | PRN
  Administered 2016-03-20 (×5): 25 ug via INTRAVENOUS

## 2016-03-20 MED ORDER — DIPHENHYDRAMINE HCL 50 MG/ML IJ SOLN
INTRAMUSCULAR | Status: AC
  Filled 2016-03-20: qty 1

## 2016-03-20 MED ORDER — MIDAZOLAM HCL 10 MG/2ML IJ SOLN
INTRAMUSCULAR | Status: DC | PRN
  Administered 2016-03-20 (×3): 2 mg via INTRAVENOUS
  Administered 2016-03-20: 1 mg via INTRAVENOUS
  Administered 2016-03-20: 2 mg via INTRAVENOUS
  Administered 2016-03-20: 1 mg via INTRAVENOUS
  Administered 2016-03-20: 2 mg via INTRAVENOUS

## 2016-03-20 MED ORDER — DIPHENHYDRAMINE HCL 50 MG/ML IJ SOLN
INTRAMUSCULAR | Status: DC | PRN
  Administered 2016-03-20 (×2): 25 mg via INTRAVENOUS

## 2016-03-20 NOTE — Discharge Instructions (Signed)
Transesophageal Echocardiogram °Transesophageal echocardiography (TEE) is a picture test of your heart using sound waves. The pictures taken can give very detailed pictures of your heart. This can help your doctor see if there are problems with your heart. TEE can check: °· If your heart has blood clots in it. °· How well your heart valves are working. °· If you have an infection on the inside of your heart. °· Some of the major arteries of your heart. °· If your heart valve is working after a repair. °· Your heart before a procedure that uses a shock to your heart to get the rhythm back to normal. °BEFORE THE PROCEDURE °· Do not eat or drink for 6 hours before the procedure or as told by your doctor. °· Make plans to have someone drive you home after the procedure. Do not drive yourself home. °· An IV tube will be put in your arm. °PROCEDURE °· You will be given a medicine to help you relax (sedative). It will be given through the IV tube. °· A numbing medicine will be sprayed or gargled in the back of your throat to help numb it. °· The tip of the probe is placed into the back of your mouth. You will be asked to swallow. This helps to pass the probe into your esophagus. °· Once the tip of the probe is in the right place, your doctor can take pictures of your heart. °· You may feel pressure at the back of your throat. °AFTER THE PROCEDURE °· You will be taken to a recovery area so the sedative can wear off. °· Your throat may be sore and scratchy. This will go away slowly over time. °· You will go home when you are fully awake and able to swallow liquids. °· You should have someone stay with you for the next 24 hours. °· Do not drive or operate machinery for the next 24 hours. °  °This information is not intended to replace advice given to you by your health care provider. Make sure you discuss any questions you have with your health care provider. °  °Document Released: 09/29/2009 Document Revised: 12/07/2013  Document Reviewed: 06/03/2013 °Elsevier Interactive Patient Education ©2016 [employer] ° ° ° °Moderate Conscious Sedation, Adult, Care After °Refer to this sheet in the next few weeks. These instructions provide you with information on caring for yourself after your procedure. Your health care provider may also give you more specific instructions. Your treatment has been planned according to current medical practices, but problems sometimes occur. Call your health care provider if you have any problems or questions after your procedure. °WHAT TO EXPECT AFTER THE PROCEDURE  °After your procedure: °· You may feel sleepy, clumsy, and have poor balance for several hours. °· Vomiting may occur if you eat too soon after the procedure. °HOME CARE INSTRUCTIONS °· Do not participate in any activities where you could become injured for at least 24 hours. Do not: °¨ Drive. °¨ Swim. °¨ Ride a bicycle. °¨ Operate heavy machinery. °¨ Cook. °¨ Use power tools. °¨ Climb ladders. °¨ Work from a high place. °· Do not make important decisions or sign legal documents until you are improved. °· If you vomit, drink water, juice, or soup when you can drink without vomiting. Make sure you have little or no nausea before eating solid foods. °· Only take over-the-counter or prescription medicines for pain, discomfort, or fever as directed by your health care provider. °· Make sure you   and your family fully understand everything about the medicines given to you, including what side effects may occur. °· You should not drink alcohol, take sleeping pills, or take medicines that cause drowsiness for at least 24 hours. °· If you smoke, do not smoke without supervision. °· If you are feeling better, you may resume normal activities 24 hours after you were sedated. °· Keep all appointments with your health care provider. °SEEK MEDICAL CARE IF: °· Your skin is pale or bluish in color. °· You continue to feel nauseous or vomit. °· Your pain is  getting worse and is not helped by medicine. °· You have bleeding or swelling. °· You are still sleepy or feeling clumsy after 24 hours. °SEEK IMMEDIATE MEDICAL CARE IF: °· You develop a rash. °· You have difficulty breathing. °· You develop any type of allergic problem. °· You have a fever. °MAKE SURE YOU: °· Understand these instructions. °· Will watch your condition. °· Will get help right away if you are not doing well or get worse. °  °This information is not intended to replace advice given to you by your health care provider. Make sure you discuss any questions you have with your health care provider. °  °Document Released: 09/22/2013 Document Revised: 12/23/2014 Document Reviewed: 09/22/2013 °Elsevier Interactive Patient Education ©2016 Elsevier Inc. ° °

## 2016-03-20 NOTE — CV Procedure (Signed)
Brief TEE Note  Unable to proceed with TEE.  Amber [name] received Fentanyl 125 mcg, Versed 12 mg, and Benadryl 50 mg IV.  She was sleepy with sedation but aroused quickly and resisted every attempt at passing the probe.  Will plan for TEE prior to ablation tomorrow.   Sedation time 20 minutes I was present throughout the entire procedure. There were no immediate complications.  [name] C. Oval Linsey, MD, [facility]  03/20/2016  2:09 PM

## 2016-03-20 NOTE — Interval H&P Note (Signed)
History and Physical Interval Note:  03/20/2016 9:24 AM  Amber [name]  has presented today for surgery, with the diagnosis of afib  The various methods of treatment have been discussed with the patient and family. After consideration of risks, benefits and other options for treatment, the patient has consented to  Procedure(s): TRANSESOPHAGEAL ECHOCARDIOGRAM (TEE) (N/A) as a surgical intervention .  This TEE is pre-ablation.  The patient's history has been reviewed, patient examined, no change in status, stable for surgery.  I have reviewed the patient's chart and labs.  Questions were answered to the patient's satisfaction.     Dandrea Widdowson C. Oval Linsey, MD, [facility]

## 2016-03-20 NOTE — H&P (View-Only) (Signed)
Electrophysiology Office Note   Date:  03/11/2016   ID:  Amber [name], DOB 08-14-1946, MRN [id number]  PCP:  Precious Reel, MD  Primary Electrophysiologist: Dr Caryl Comes  Chief Complaint  Patient presents with  . Atrial Fibrillation    pt states no chest pain  . Shortness of Breath    only when in AFIB     History of Present Illness: Amber [name] is a 70 y.o. female who presents today for electrophysiology evaluation.   She initially presented to Dr Caryl Comes with rare palpitations.  She had an implantable loop recorder placed in 2013 which documented atrial fibrillation as the cause.  Per the patient, no other arrhythmias were seen.  She was placed on eliquis and metoprolol.  Eventually metoprolol was switched to lebatolol due to elevations in BP.   She was also given flecainide as a pill in pocket approach.  Her afib increased in frequency to weekly.  Her flecainide was changed to 50mg  BID without improvement.  She was not offered a higher dose due to her RBBB.  She now feels that she is in atrial fibrillation the majority of the time.   During atrial fibrillation she has palpitations and fatigue.  She has had labile blood pressures.  After taking her morning medicines, she finds that her blood pressure is very low.  She has dizziness with this.  Today, she denies symptoms of palpitations, chest pain, shortness of breath, orthopnea, PND, lower extremity edema, claudication, dizziness, presyncope, syncope, bleeding, or neurologic sequela. The patient is tolerating medications without difficulties and is otherwise without complaint today.    Past Medical History  Diagnosis Date  . Chest pain     + Tn s/p cath 2012--Normal  . HTN (hypertension)   . Persistent atrial fibrillation (HCC)     three types  . Obesity   . History of tobacco abuse   . Post-menopausal   . Right bundle branch block   . Hyperlipemia   . Abdominal mass     pt unaware  . Arthritis   . Osteoporosis   . IBS  (irritable bowel syndrome)    Past Surgical History  Procedure Laterality Date  . Breast reduction surgery      BILATERAL  . Carpal tunnel release    . Tonsillectomy and adenoidectomy  AGE 64  . Ovarian cyst removal  1978  . Cardiac catheterization  02-25-2011    EF 60%. Left coronary artery arises and distributes normal, right coronary artery is a dominant vessel and is normal  . US echocardiography  12-23-2006    EF 55-60%  . Cardiovascular stress test  08-27-2002    EF 76%  . Breast biopsy      left breast- benign  . Loop recorder explant N/A 12/28/2014    Procedure: LOOP RECORDER EXPLANT;  Surgeon: Deboraha Sprang, MD     Current Outpatient Prescriptions  Medication Sig Dispense Refill  . Cholecalciferol (VITAMIN D PO) Take 1 tablet by mouth daily.     Marland Kitchen ELIQUIS 5 MG TABS tablet TAKE 1 TABLET BY MOUTH TWICE DAILY 60 tablet 11  . esomeprazole (NEXIUM) 40 MG capsule Take 40 mg by mouth daily as needed (heartburn).     . fish oil-omega-3 fatty acids 1000 MG capsule Take 2 g by mouth daily with lunch.     . flecainide (TAMBOCOR) 50 MG tablet Take 1 tablet (50 mg total) by mouth 2 (two) times daily. 60 tablet 3  . labetalol (NORMODYNE)  200 MG tablet Take two tablets (400 mg) by mouth twice daily    . losartan-hydrochlorothiazide (HYZAAR) 100-25 MG tablet Take 1 tablet by mouth daily. 90 tablet 3  . potassium chloride (K-DUR,KLOR-CON) 10 MEQ tablet Take 10 mEq by mouth daily.     . rosuvastatin (CRESTOR) 5 MG tablet Take 5 mg by mouth every other day.     No current facility-administered medications for this visit.    Allergies:   Ciprofloxacin   Social History:  The patient  reports that she has never smoked. She has never used smokeless tobacco. She reports that she drinks about 4.2 oz of alcohol per week. She reports that she does not use illicit drugs.   Family History:  The patient's  family history includes Atrial fibrillation in her mother; Diabetes in her father; Heart failure  in her father; Prostate cancer in her father.    ROS:  Please see the history of present illness.   All other systems are reviewed and negative.    PHYSICAL EXAM: VS:  BP 92/64 mmHg  Pulse 112  Ht 5\' 4"  (1.626 m)  Wt 167 lb 12.8 oz (76.114 kg)  BMI 28.79 kg/m2 , BMI Body mass index is 28.79 kg/(m^2). GEN: Well nourished, well developed, in no acute distress HEENT: normal Neck: no JVD, carotid bruits, or masses Cardiac: iRRR; no murmurs, rubs, or gallops,no edema  Respiratory:  clear to auscultation bilaterally, normal work of breathing GI: soft, nontender, nondistended, + BS MS: no deformity or atrophy Skin: warm and dry  Neuro:  Strength and sensation are intact Psych: euthymic mood, full affect  EKG:  EKG is ordered today. The ekg ordered today shows afib with coarse fib waves, V rate 112 bpm, RBBB, nonspecific ST/T changes   Lipid Panel     Component Value Date/Time   CHOL 192 04/15/2012 1004   TRIG 61.0 04/15/2012 1004   HDL 93.70 04/15/2012 1004   CHOLHDL 2 04/15/2012 1004   VLDL 12.2 04/15/2012 1004   LDLCALC 86 04/15/2012 1004     Wt Readings from Last 3 Encounters:  03/11/16 167 lb 12.8 oz (76.114 kg)  02/26/16 169 lb 6.4 oz (76.839 kg)  01/24/16 172 lb (78.019 kg)      Other studies Reviewed: Additional studies/ records that were reviewed today include: AF clinic notes, prior ekgs, prior echo, Dr Aquilla Hacker notes   ASSESSMENT AND PLAN:  1.  Persistent afib The patient has increasing frequency and duration of afib and appears to be in afib all of the time presently. Therapeutic strategies for afib including medicine (tikosyn/ amiodarone) and ablation were discussed in detail with the patient today. Risk, benefits, and alternatives to EP study and radiofrequency ablation for afib were also discussed in detail today.  She is clear that she would like to avoid this.  Risks and benefits of amiodarone were discussed at length.  She understands that risks including  liver, thyroid, lung, ocular toxicity and possibly even death.  She accepts these risks and wishes to proceed. I will therefore stop flecainide and start amiodarone 200mg  BID after 48 hour washout. She will follow-up with Butch Penny in the AF clinic in 4 weeks. If she remains in afib at that time then she will require cardioversion. Continue long term anticoagulation Follow-up with Dr Caryl Comes in 3 months  2. HTN Frequent daytime hypotension with symptoms Requests that we stop labetolol She was previously on toprol 100mg  daily.  I will therefore stop labetalol and resume toprol XL 100mg   daily at this time.  Current medicines are reviewed at length with the patient today.   The patient does not have concerns regarding her medicines.  The following changes were made today:  none  Signed, Thompson Grayer, MD  03/11/2016 11:47 AM     [facility] [address] [place] [address] [phone] (office) [phone] (fax)

## 2016-03-20 NOTE — OR Nursing (Signed)
Unable to sedate the patient enough to pass the probe TEE aborted

## 2016-03-21 ENCOUNTER — Encounter (HOSPITAL_COMMUNITY)
Admission: RE | Disposition: A | Discharge status: Home / Self Care | Payer: Self-pay | Source: Ambulatory Visit | Attending: Internal Medicine

## 2016-03-21 ENCOUNTER — Ambulatory Visit (HOSPITAL_BASED_OUTPATIENT_CLINIC_OR_DEPARTMENT_OTHER): Payer: Medicare Other

## 2016-03-21 ENCOUNTER — Ambulatory Visit (HOSPITAL_COMMUNITY): Payer: Medicare Other | Admitting: Certified Registered"

## 2016-03-21 ENCOUNTER — Encounter (HOSPITAL_COMMUNITY): Payer: Self-pay | Admitting: Certified Registered Nurse Anesthetist

## 2016-03-21 ENCOUNTER — Ambulatory Visit (HOSPITAL_COMMUNITY)
Admission: RE | Admit: 2016-03-21 | Discharge: 2016-03-22 | Disposition: A | Discharge status: Home / Self Care | Payer: Medicare Other | Source: Ambulatory Visit | Attending: Internal Medicine | Admitting: Internal Medicine

## 2016-03-21 DIAGNOSIS — I10 Essential (primary) hypertension: Secondary | ICD-10-CM | POA: Diagnosis not present

## 2016-03-21 DIAGNOSIS — E669 Obesity, unspecified: Secondary | ICD-10-CM | POA: Insufficient documentation

## 2016-03-21 DIAGNOSIS — E785 Hyperlipidemia, unspecified: Secondary | ICD-10-CM | POA: Insufficient documentation

## 2016-03-21 DIAGNOSIS — Z79899 Other long term (current) drug therapy: Secondary | ICD-10-CM | POA: Diagnosis not present

## 2016-03-21 DIAGNOSIS — Z6829 Body mass index (BMI) 29.0-29.9, adult: Secondary | ICD-10-CM | POA: Diagnosis not present

## 2016-03-21 DIAGNOSIS — I480 Paroxysmal atrial fibrillation: Secondary | ICD-10-CM | POA: Diagnosis not present

## 2016-03-21 DIAGNOSIS — I340 Nonrheumatic mitral (valve) insufficiency: Secondary | ICD-10-CM | POA: Diagnosis not present

## 2016-03-21 DIAGNOSIS — I272 Other secondary pulmonary hypertension: Secondary | ICD-10-CM | POA: Diagnosis not present

## 2016-03-21 DIAGNOSIS — I4891 Unspecified atrial fibrillation: Secondary | ICD-10-CM | POA: Diagnosis present

## 2016-03-21 DIAGNOSIS — I481 Persistent atrial fibrillation: Secondary | ICD-10-CM | POA: Diagnosis not present

## 2016-03-21 DIAGNOSIS — Z7901 Long term (current) use of anticoagulants: Secondary | ICD-10-CM | POA: Insufficient documentation

## 2016-03-21 HISTORY — PX: ELECTROPHYSIOLOGIC STUDY: SHX172A

## 2016-03-21 LAB — MRSA PCR SCREENING: MRSA BY PCR: NEGATIVE

## 2016-03-21 LAB — POCT ACTIVATED CLOTTING TIME
ACTIVATED CLOTTING TIME: 301 s
ACTIVATED CLOTTING TIME: 322 s
ACTIVATED CLOTTING TIME: 332 s
ACTIVATED CLOTTING TIME: 291 s
ACTIVATED CLOTTING TIME: 142 s

## 2016-03-21 SURGERY — ATRIAL FIBRILLATION ABLATION
Anesthesia: General

## 2016-03-21 MED ORDER — EPHEDRINE SULFATE 50 MG/ML IJ SOLN
INTRAMUSCULAR | Status: DC | PRN
  Administered 2016-03-21 (×5): 10 mg via INTRAVENOUS

## 2016-03-21 MED ORDER — SODIUM CHLORIDE 0.9 % IV SOLN: 250.0000 mL | INTRAVENOUS | Status: DC | PRN

## 2016-03-21 MED ORDER — HEPARIN SODIUM (PORCINE) 1000 UNIT/ML IJ SOLN
INTRAMUSCULAR | Status: DC | PRN
  Administered 2016-03-21: 2000 [IU] via INTRAVENOUS
  Administered 2016-03-21: 12000 [IU] via INTRAVENOUS

## 2016-03-21 MED ORDER — LIDOCAINE HCL (CARDIAC) 20 MG/ML IV SOLN
INTRAVENOUS | Status: DC | PRN
  Administered 2016-03-21: 80 mg via INTRAVENOUS

## 2016-03-21 MED ORDER — HEPARIN SODIUM (PORCINE) 1000 UNIT/ML IJ SOLN
INTRAMUSCULAR | Status: DC | PRN
  Administered 2016-03-21: 12000 [IU] via INTRAVENOUS
  Administered 2016-03-21 (×2): 1000 [IU] via INTRAVENOUS

## 2016-03-21 MED ORDER — FENTANYL CITRATE (PF) 100 MCG/2ML IJ SOLN: 25.0000 ug | INTRAMUSCULAR | Status: DC | PRN

## 2016-03-21 MED ORDER — BUPIVACAINE HCL (PF) 0.25 % IJ SOLN
INTRAMUSCULAR | Status: AC
  Filled 2016-03-21: qty 30

## 2016-03-21 MED ORDER — PROPOFOL 500 MG/50ML IV EMUL: INTRAVENOUS | Status: DC | PRN

## 2016-03-21 MED ORDER — ONDANSETRON HCL 4 MG/2ML IJ SOLN
INTRAMUSCULAR | Status: DC | PRN
  Administered 2016-03-21: 4 mg via INTRAVENOUS

## 2016-03-21 MED ORDER — PANTOPRAZOLE SODIUM 40 MG PO TBEC
40.0000 mg | DELAYED_RELEASE_TABLET | Freq: Every day | ORAL | Status: DC
  Administered 2016-03-21 – 2016-03-22 (×2): 40 mg via ORAL
  Filled 2016-03-21 (×2): qty 1

## 2016-03-21 MED ORDER — PROPOFOL 500 MG/50ML IV EMUL
INTRAVENOUS | Status: DC | PRN
  Administered 2016-03-21: 25 ug/kg/min via INTRAVENOUS

## 2016-03-21 MED ORDER — HYDROCHLOROTHIAZIDE 25 MG PO TABS
25.0000 mg | ORAL_TABLET | Freq: Every day | ORAL | Status: DC
  Administered 2016-03-22: 25 mg via ORAL
  Filled 2016-03-21: qty 1

## 2016-03-21 MED ORDER — IOPAMIDOL (ISOVUE-370) INJECTION 76%
INTRAVENOUS | Status: DC | PRN
  Administered 2016-03-21: 100 mL
  Administered 2016-03-21: 5 mL

## 2016-03-21 MED ORDER — FENTANYL CITRATE (PF) 100 MCG/2ML IJ SOLN
INTRAMUSCULAR | Status: DC | PRN
  Administered 2016-03-21 (×3): 50 ug via INTRAVENOUS

## 2016-03-21 MED ORDER — SUCCINYLCHOLINE CHLORIDE 20 MG/ML IJ SOLN
INTRAMUSCULAR | Status: DC | PRN
  Administered 2016-03-21: 100 mg via INTRAVENOUS

## 2016-03-21 MED ORDER — ACETAMINOPHEN 325 MG PO TABS
650.0000 mg | ORAL_TABLET | ORAL | Status: DC | PRN
  Administered 2016-03-22: 650 mg via ORAL
  Filled 2016-03-21: qty 2

## 2016-03-21 MED ORDER — HYDROCODONE-ACETAMINOPHEN 5-325 MG PO TABS: 1.0000 | ORAL_TABLET | ORAL | Status: DC | PRN

## 2016-03-21 MED ORDER — BUPIVACAINE HCL (PF) 0.25 % IJ SOLN
INTRAMUSCULAR | Status: DC | PRN
  Administered 2016-03-21: 5 mL

## 2016-03-21 MED ORDER — HEPARIN SODIUM (PORCINE) 1000 UNIT/ML IJ SOLN
INTRAMUSCULAR | Status: AC
Start: 2016-03-21 — End: 2016-03-21
  Filled 2016-03-21: qty 1

## 2016-03-21 MED ORDER — SODIUM CHLORIDE 0.9% FLUSH
3.0000 mL | Freq: Two times a day (BID) | INTRAVENOUS | Status: DC
  Administered 2016-03-21: 3 mL via INTRAVENOUS

## 2016-03-21 MED ORDER — ONDANSETRON HCL 4 MG/2ML IJ SOLN: 4.0000 mg | Freq: Four times a day (QID) | INTRAMUSCULAR | Status: DC | PRN

## 2016-03-21 MED ORDER — DEXAMETHASONE SODIUM PHOSPHATE 4 MG/ML IJ SOLN
INTRAMUSCULAR | Status: DC | PRN
  Administered 2016-03-21: 4 mg via INTRAVENOUS

## 2016-03-21 MED ORDER — PROPOFOL 10 MG/ML IV BOLUS
INTRAVENOUS | Status: DC | PRN
  Administered 2016-03-21: 30 mg via INTRAVENOUS
  Administered 2016-03-21: 170 mg via INTRAVENOUS
  Administered 2016-03-21: 30 mg via INTRAVENOUS

## 2016-03-21 MED ORDER — DOBUTAMINE IN D5W 4-5 MG/ML-% IV SOLN
INTRAVENOUS | Status: DC | PRN
  Administered 2016-03-21: 20 ug/kg/min via INTRAVENOUS

## 2016-03-21 MED ORDER — PROTAMINE SULFATE 10 MG/ML IV SOLN
INTRAVENOUS | Status: DC | PRN
  Administered 2016-03-21: 30 mg via INTRAVENOUS

## 2016-03-21 MED ORDER — SODIUM CHLORIDE 0.9% FLUSH: 3.0000 mL | INTRAVENOUS | Status: DC | PRN

## 2016-03-21 MED ORDER — IOPAMIDOL (ISOVUE-370) INJECTION 76%
INTRAVENOUS | Status: AC
  Filled 2016-03-21: qty 125

## 2016-03-21 MED ORDER — MIDAZOLAM HCL 5 MG/5ML IJ SOLN
INTRAMUSCULAR | Status: DC | PRN
  Administered 2016-03-21 (×2): 2 mg via INTRAVENOUS

## 2016-03-21 MED ORDER — APIXABAN 5 MG PO TABS
5.0000 mg | ORAL_TABLET | Freq: Two times a day (BID) | ORAL | Status: DC
  Administered 2016-03-21 – 2016-03-22 (×2): 5 mg via ORAL
  Filled 2016-03-21 (×3): qty 1

## 2016-03-21 MED ORDER — LOSARTAN POTASSIUM-HCTZ 100-25 MG PO TABS: 1.0000 | ORAL_TABLET | Freq: Every day | ORAL | Status: DC

## 2016-03-21 MED ORDER — LOSARTAN POTASSIUM 50 MG PO TABS
100.0000 mg | ORAL_TABLET | Freq: Every day | ORAL | Status: DC
  Administered 2016-03-22: 100 mg via ORAL
  Filled 2016-03-21: qty 2

## 2016-03-21 MED ORDER — SODIUM CHLORIDE 0.9 % IV SOLN
INTRAVENOUS | Status: DC
  Administered 2016-03-21 (×3): via INTRAVENOUS

## 2016-03-21 SURGICAL SUPPLY — 21 items
BAG SNAP BAND KOVER 36X36 (MISCELLANEOUS) ×2 IMPLANT
BLANKET WARM UNDERBOD FULL ACC (MISCELLANEOUS) ×2 IMPLANT
CATH DIAG 6FR PIGTAIL (CATHETERS) ×2 IMPLANT
CATH NAVISTAR SMARTTOUCH DF (ABLATOR) ×2 IMPLANT
CATH SOUNDSTAR 3D IMAGING (CATHETERS) ×2 IMPLANT
CATH VARIABLE LASSO NAV 2515 (CATHETERS) ×2 IMPLANT
CATH WEBSTER BI DIR CS D-F CRV (CATHETERS) ×2 IMPLANT
COVER SWIFTLINK CONNECTOR (BAG) ×2 IMPLANT
NEEDLE TRANSEP BRK 71CM 407200 (NEEDLE) ×2 IMPLANT
PACK EP LATEX FREE (CUSTOM PROCEDURE TRAY) ×1
PACK EP LF (CUSTOM PROCEDURE TRAY) ×1 IMPLANT
PAD DEFIB LIFELINK (PAD) ×2 IMPLANT
PATCH CARTO3 (PAD) ×2 IMPLANT
SHEATH AVANTI 11F 11CM (SHEATH) ×2 IMPLANT
SHEATH PINNACLE 7F 10CM (SHEATH) ×4 IMPLANT
SHEATH PINNACLE 9F 10CM (SHEATH) ×2 IMPLANT
SHEATH SWARTZ TS SL2 63CM 8.5F (SHEATH) ×2 IMPLANT
SHIELD RADPAD SCOOP 12X17 (MISCELLANEOUS) ×2 IMPLANT
SYR MEDRAD MARK V 150ML (SYRINGE) ×2 IMPLANT
TUBING CONTRAST HIGH PRESS 48 (TUBING) ×2 IMPLANT
TUBING SMART ABLATE COOLFLOW (TUBING) ×2 IMPLANT

## 2016-03-21 NOTE — Transfer of Care (Signed)
Immediate Anesthesia Transfer of Care Note  Patient: Amber [name]  Procedure(s) Performed: Procedure(s): Atrial Fibrillation Ablation (N/A)  Patient Location: PACU  Anesthesia Type:General  Level of Consciousness: awake, alert , oriented, patient cooperative and responds to stimulation  Airway & Oxygen Therapy: Patient Spontanous Breathing and Patient connected to nasal cannula oxygen  Post-op Assessment: Report given to RN, Post -op Vital signs reviewed and stable and Patient moving all extremities X 4  Post vital signs: Reviewed and stable  Last Vitals:  Filed Vitals:   03/21/16 0543 03/21/16 1215  BP: 179/88 128/55  Pulse: 71 76  Temp: 36.5 C   Resp: 16 10    Complications: No apparent anesthesia complications

## 2016-03-21 NOTE — Progress Notes (Signed)
Site area: right groin a 7, 9, 11 french venous sheaths were removed  Site Prior to Removal:  Level 0  Pressure Applied For 20 MINUTES    Bedrest Beginning at 1320p  Manual:   Yes.    Patient Status During Pull:  stable  Post Pull Groin Site:  Level 0  Post Pull Instructions Given:  Yes.    Post Pull Pulses Present:  Yes.    Dressing Applied:  Yes.    Comments:  VS remain stable during sheath pull

## 2016-03-21 NOTE — Anesthesia Postprocedure Evaluation (Signed)
Anesthesia Post Note  Patient: Amber [name]  Procedure(s) Performed: Procedure(s) (LRB): Atrial Fibrillation Ablation (N/A)  Patient location during evaluation: PACU Anesthesia Type: General Level of consciousness: sedated and patient cooperative Pain management: pain level controlled Vital Signs Assessment: post-procedure vital signs reviewed and stable Respiratory status: spontaneous breathing Cardiovascular status: stable Anesthetic complications: no    Last Vitals:  Filed Vitals:   03/21/16 1420 03/21/16 1435  BP: 94/58 134/66  Pulse: 71 83  Temp:    Resp: 18 20    Last Pain: There were no vitals filed for this visit.               Nolon Nations

## 2016-03-21 NOTE — H&P (View-Only) (Signed)
Electrophysiology Office Note   Date:  03/11/2016   ID:  Amber [name], DOB [birth year], MRN [id number]  PCP:  Precious Reel, MD  Primary Electrophysiologist: Dr Caryl Comes  Chief Complaint  Patient presents with  . Atrial Fibrillation    pt states no chest pain  . Shortness of Breath    only when in AFIB     History of Present Illness: Amber [name] is a 70 y.o. female who presents today for electrophysiology evaluation.   She initially presented to Dr Caryl Comes with rare palpitations.  She had an implantable loop recorder placed in 2013 which documented atrial fibrillation as the cause.  Per the patient, no other arrhythmias were seen.  She was placed on eliquis and metoprolol.  Eventually metoprolol was switched to lebatolol due to elevations in BP.   She was also given flecainide as a pill in pocket approach.  Her afib increased in frequency to weekly.  Her flecainide was changed to 50mg  BID without improvement.  She was not offered a higher dose due to her RBBB.  She now feels that she is in atrial fibrillation the majority of the time.   During atrial fibrillation she has palpitations and fatigue.  She has had labile blood pressures.  After taking her morning medicines, she finds that her blood pressure is very low.  She has dizziness with this.  Today, she denies symptoms of palpitations, chest pain, shortness of breath, orthopnea, PND, lower extremity edema, claudication, dizziness, presyncope, syncope, bleeding, or neurologic sequela. The patient is tolerating medications without difficulties and is otherwise without complaint today.    Past Medical History  Diagnosis Date  . Chest pain     + Tn s/p cath 2012--Normal  . HTN (hypertension)   . Persistent atrial fibrillation (HCC)     three types  . Obesity   . History of tobacco abuse   . Post-menopausal   . Right bundle branch block   . Hyperlipemia   . Abdominal mass     pt unaware  . Arthritis   . Osteoporosis   . IBS  (irritable bowel syndrome)    Past Surgical History  Procedure Laterality Date  . Breast reduction surgery      BILATERAL  . Carpal tunnel release    . Tonsillectomy and adenoidectomy  AGE 76  . Ovarian cyst removal  1978  . Cardiac catheterization  02-25-2011    EF 60%. Left coronary artery arises and distributes normal, right coronary artery is a dominant vessel and is normal  . US echocardiography  12-23-2006    EF 55-60%  . Cardiovascular stress test  08-27-2002    EF 76%  . Breast biopsy      left breast- benign  . Loop recorder explant N/A 12/28/2014    Procedure: LOOP RECORDER EXPLANT;  Surgeon: Deboraha Sprang, MD     Current Outpatient Prescriptions  Medication Sig Dispense Refill  . Cholecalciferol (VITAMIN D PO) Take 1 tablet by mouth daily.     Marland Kitchen ELIQUIS 5 MG TABS tablet TAKE 1 TABLET BY MOUTH TWICE DAILY 60 tablet 11  . esomeprazole (NEXIUM) 40 MG capsule Take 40 mg by mouth daily as needed (heartburn).     . fish oil-omega-3 fatty acids 1000 MG capsule Take 2 g by mouth daily with lunch.     . flecainide (TAMBOCOR) 50 MG tablet Take 1 tablet (50 mg total) by mouth 2 (two) times daily. 60 tablet 3  . labetalol (NORMODYNE)  200 MG tablet Take two tablets (400 mg) by mouth twice daily    . losartan-hydrochlorothiazide (HYZAAR) 100-25 MG tablet Take 1 tablet by mouth daily. 90 tablet 3  . potassium chloride (K-DUR,KLOR-CON) 10 MEQ tablet Take 10 mEq by mouth daily.     . rosuvastatin (CRESTOR) 5 MG tablet Take 5 mg by mouth every other day.     No current facility-administered medications for this visit.    Allergies:   Ciprofloxacin   Social History:  The patient  reports that she has never smoked. She has never used smokeless tobacco. She reports that she drinks about 4.2 oz of alcohol per week. She reports that she does not use illicit drugs.   Family History:  The patient's  family history includes Atrial fibrillation in her mother; Diabetes in her father; Heart failure  in her father; Prostate cancer in her father.    ROS:  Please see the history of present illness.   All other systems are reviewed and negative.    PHYSICAL EXAM: VS:  BP 92/64 mmHg  Pulse 112  Ht 5\' 4"  (1.626 m)  Wt 167 lb 12.8 oz (76.114 kg)  BMI 28.79 kg/m2 , BMI Body mass index is 28.79 kg/(m^2). GEN: Well nourished, well developed, in no acute distress HEENT: normal Neck: no JVD, carotid bruits, or masses Cardiac: iRRR; no murmurs, rubs, or gallops,no edema  Respiratory:  clear to auscultation bilaterally, normal work of breathing GI: soft, nontender, nondistended, + BS MS: no deformity or atrophy Skin: warm and dry  Neuro:  Strength and sensation are intact Psych: euthymic mood, full affect  EKG:  EKG is ordered today. The ekg ordered today shows afib with coarse fib waves, V rate 112 bpm, RBBB, nonspecific ST/T changes   Lipid Panel     Component Value Date/Time   CHOL 192 04/15/2012 1004   TRIG 61.0 04/15/2012 1004   HDL 93.70 04/15/2012 1004   CHOLHDL 2 04/15/2012 1004   VLDL 12.2 04/15/2012 1004   LDLCALC 86 04/15/2012 1004     Wt Readings from Last 3 Encounters:  03/11/16 167 lb 12.8 oz (76.114 kg)  02/26/16 169 lb 6.4 oz (76.839 kg)  01/24/16 172 lb (78.019 kg)      Other studies Reviewed: Additional studies/ records that were reviewed today include: AF clinic notes, prior ekgs, prior echo, Dr Aquilla Hacker notes   ASSESSMENT AND PLAN:  1.  Persistent afib The patient has increasing frequency and duration of afib and appears to be in afib all of the time presently. Therapeutic strategies for afib including medicine (tikosyn/ amiodarone) and ablation were discussed in detail with the patient today. Risk, benefits, and alternatives to EP study and radiofrequency ablation for afib were also discussed in detail today.  She is clear that she would like to avoid this.  Risks and benefits of amiodarone were discussed at length.  She understands that risks including  liver, thyroid, lung, ocular toxicity and possibly even death.  She accepts these risks and wishes to proceed. I will therefore stop flecainide and start amiodarone 200mg  BID after 48 hour washout. She will follow-up with Butch Penny in the AF clinic in 4 weeks. If she remains in afib at that time then she will require cardioversion. Continue long term anticoagulation Follow-up with Dr Caryl Comes in 3 months  2. HTN Frequent daytime hypotension with symptoms Requests that we stop labetolol She was previously on toprol 100mg  daily.  I will therefore stop labetalol and resume toprol XL 100mg   daily at this time.  Current medicines are reviewed at length with the patient today.   The patient does not have concerns regarding her medicines.  The following changes were made today:  none  Signed, Thompson Grayer, MD  03/11/2016 11:47 AM     [facility] [address] [place] [address] [phone] (office) [phone] (fax)

## 2016-03-21 NOTE — Interval H&P Note (Signed)
History and Physical Interval Note:  03/21/2016 7:17 AM  Amber [name]  has presented today for surgery, with the diagnosis of afib  The various methods of treatment have been discussed with the patient and family. After consideration of risks, benefits and other options for treatment, the patient has consented to  Procedure(s): Atrial Fibrillation Ablation (N/A) as a surgical intervention .  The patient's history has been reviewed, patient examined, no change in status, stable for surgery.  I have reviewed the patient's chart and labs.  Questions were answered to the patient's satisfaction.   Pt reports compliance with eliquis without interuption.  TEE not successful yesterday.  Will plan to repeat TEE prior to ablation today.   Therapeutic strategies for afib including medicine and ablation were discussed in detail with the patient today. Risk, benefits, and alternatives to TEE as well as EP study and radiofrequency ablation for afib were also discussed in detail today. These risks include but are not limited to stroke, bleeding, vascular damage, tamponade, perforation, damage to the esophagus, lungs, and other structures, pulmonary vein stenosis, worsening renal function, and death. The patient understands these risk and wishes to proceed at this time.  Amber [name]

## 2016-03-21 NOTE — Discharge Summary (Signed)
ELECTROPHYSIOLOGY PROCEDURE DISCHARGE SUMMARY    Patient ID: Amber [name],  MRN: [id number], DOB/AGE: [birth year] 70 y.o.  Admit date: 03/21/2016 Discharge date: 03/22/2016  Primary Care Physician: Precious Reel, MD Electrophysiologist: Thomas Hoff, MD  Primary Discharge Diagnosis:  Persistent atrial fibrillation status post ablation this admission  Secondary Discharge Diagnosis:  1.  Hypertension 2.  Obesity 3.  Hyperlipidemia   Procedures This Admission:  1.  Electrophysiology study and radiofrequency catheter ablation on 03/21/16 by Dr Thompson Grayer.  This study demonstrated sinus rhythm upon presentation; rotational Angiography reveals a moderate sized left atrium with four separate pulmonary veins without evidence of pulmonary vein stenosis; successful electrical isolation and anatomical encircling of all four pulmonary veins with radiofrequency current; no inducible arrhythmias following ablation both on and off of dobutamine; no early apparent complications.    Brief HPI: Amber [name] is a 70 y.o. female with a history of persistent atrial fibrillation.  They have failed medical therapy with Flecainide. Risks, benefits, and alternatives to catheter ablation of atrial fibrillation were reviewed with the patient who wished to proceed.  The patient underwent TEE prior to the procedure which demonstrated normal LV function and no LAA thrombus.     Hospital Course:  The patient was admitted and underwent EPS/RFCA of atrial fibrillation with details as outlined above.  They were monitored on telemetry overnight which demonstrated sinus rhythm.  Groin was without complication on the day of discharge.  The patient was examined and considered to be stable for discharge.  Wound care and restrictions were reviewed with the patient.  The patient will be seen back by Roderic Palau, NP in 4 weeks and Dr Rayann Heman in 12 weeks for post ablation follow up.   This patients CHA2DS2-VASc  Score and unadjusted Ischemic Stroke Rate (% per year) is equal to 3.2 % stroke rate/year from a score of 3 Above score calculated as 1 point each if present [CHF, HTN, DM, Vascular=MI/PAD/Aortic Plaque, Age if 65-74, or Female] Above score calculated as 2 points each if present [Age > 75, or Stroke/TIA/TE]   Physical Exam: Filed Vitals:   03/22/16 0400 03/22/16 0457 03/22/16 0500 03/22/16 0600  BP:  137/73    Pulse:      Temp:  98.7 F (37.1 C)    TempSrc:  Oral    Resp: 19 14 24 17   Height:      Weight:      SpO2:  99%      GEN- The patient is well appearing, alert and oriented x 3 today.   HEENT: normocephalic, atraumatic; sclera clear, conjunctiva pink; hearing intact; oropharynx clear; neck supple  Lungs- Clear to ausculation bilaterally, normal work of breathing.  No wheezes, rales, rhonchi Heart- Regular rate and rhythm, no murmurs, rubs or gallops  GI- soft, non-tender, non-distended, bowel sounds present  Extremities- no clubbing, cyanosis, or edema; DP/PT/radial pulses 2+ bilaterally, groin without hematoma/bruit MS- no significant deformity or atrophy Skin- warm and dry, no rash or lesion Psych- euthymic mood, full affect Neuro- strength and sensation are intact   Labs:   Lab Results  Component Value Date   WBC 5.5 03/15/2016   HGB 13.5 03/15/2016   HCT 39.7 03/15/2016   MCV 97.8 03/15/2016   PLT 315 03/15/2016     Recent Labs Lab 03/22/16 0217  NA 134*  K 4.0  CL 102  CO2 23  BUN 8  CREATININE 0.74  CALCIUM 8.5*  GLUCOSE 109*     Discharge  Medications:    Medication List    TAKE these medications        acetaminophen 500 MG tablet  Commonly known as:  TYLENOL  Take 500 mg by mouth daily as needed for mild pain or headache.     amiodarone 200 MG tablet  Commonly known as:  PACERONE  Take 1 tablet (200 mg total) by mouth daily.     ELIQUIS 5 MG Tabs tablet  Generic drug:  apixaban  TAKE 1 TABLET BY MOUTH TWICE DAILY     esomeprazole  40 MG capsule  Commonly known as:  NEXIUM  Take 40 mg by mouth daily as needed (heartburn).     fish oil-omega-3 fatty acids 1000 MG capsule  Take 2 g by mouth daily with lunch.     losartan-hydrochlorothiazide 100-25 MG tablet  Commonly known as:  HYZAAR  Take 1 tablet by mouth daily.     metoprolol succinate 100 MG 24 hr tablet  Commonly known as:  TOPROL-XL  Take 1 tablet (100 mg total) by mouth daily. Take with or immediately following a meal.     potassium chloride 10 MEQ tablet  Commonly known as:  K-DUR,KLOR-CON  Take 10 mEq by mouth daily.     rosuvastatin 5 MG tablet  Commonly known as:  CRESTOR  Take 5 mg by mouth every other day.     VITAMIN D PO  Take 1 tablet by mouth daily.        Disposition:  Discharge Instructions    Diet - low sodium heart healthy    Complete by:  As directed      Discharge instructions    Complete by:  As directed   No driving for 4 days. No lifting over 5 lbs for 1 week. No sexual activity for 1 week. You may return to work in 1 week. Keep procedure site clean & dry. If you notice increased pain, swelling, bleeding or pus, call/return!  You may shower, but no soaking baths/hot tubs/pools for 1 week.     Increase activity slowly    Complete by:  As directed           Follow-up Information    Follow up with [facility] On 04/18/2016.   Specialty:  Cardiology   Why:  at [facility] information:   [id number] [place] [phone]      Follow up with Thompson Grayer, MD On 06/24/2016.   Specialty:  Cardiology   Why:  at [facility] information:   [place] [address] [phone]       Duration of Discharge Encounter: Greater than 30 minutes including physician time.  Signed, Chanetta Marshall, NP 03/22/2016 6:58 AM    Trude Mcburney

## 2016-03-21 NOTE — Discharge Instructions (Signed)

## 2016-03-21 NOTE — Anesthesia Preprocedure Evaluation (Addendum)
Anesthesia Evaluation  Patient identified by MRN, date of birth, ID band Patient awake    Reviewed: Allergy & Precautions, H&P , NPO status , Patient's Chart, lab work & pertinent test results  Airway Mallampati: I  TM Distance: >3 FB Neck ROM: Full    Dental no notable dental hx. (+) Teeth Intact, Dental Advisory Given   Pulmonary neg pulmonary ROS,    Pulmonary exam normal breath sounds clear to auscultation       Cardiovascular hypertension, Pt. on medications and Pt. on home beta blockers + dysrhythmias Atrial Fibrillation  Rhythm:Regular Rate:Normal     Neuro/Psych negative neurological ROS  negative psych ROS   GI/Hepatic negative GI ROS, Neg liver ROS,   Endo/Other  negative endocrine ROS  Renal/GU negative Renal ROS  negative genitourinary   Musculoskeletal  (+) Arthritis , Osteoarthritis,    Abdominal   Peds  Hematology negative hematology ROS (+)   Anesthesia Other Findings   Reproductive/Obstetrics negative OB ROS                           Anesthesia Physical Anesthesia Plan  ASA: III  Anesthesia Plan: General   Post-op Pain Management:    Induction: Intravenous  Airway Management Planned: Oral ETT  Additional Equipment:   Intra-op Plan:   Post-operative Plan: Extubation in OR  Informed Consent: I have reviewed the patients History and Physical, chart, labs and discussed the procedure including the risks, benefits and alternatives for the proposed anesthesia with the patient or authorized representative who has indicated his/her understanding and acceptance.   Dental advisory given  Plan Discussed with: CRNA and Surgeon  Anesthesia Plan Comments:        Anesthesia Quick Evaluation

## 2016-03-22 ENCOUNTER — Encounter (HOSPITAL_COMMUNITY): Payer: Self-pay | Admitting: Internal Medicine

## 2016-03-22 DIAGNOSIS — I272 Other secondary pulmonary hypertension: Secondary | ICD-10-CM | POA: Diagnosis not present

## 2016-03-22 DIAGNOSIS — E669 Obesity, unspecified: Secondary | ICD-10-CM | POA: Diagnosis not present

## 2016-03-22 DIAGNOSIS — E785 Hyperlipidemia, unspecified: Secondary | ICD-10-CM | POA: Diagnosis not present

## 2016-03-22 DIAGNOSIS — I481 Persistent atrial fibrillation: Secondary | ICD-10-CM | POA: Diagnosis not present

## 2016-03-22 DIAGNOSIS — Z6829 Body mass index (BMI) 29.0-29.9, adult: Secondary | ICD-10-CM | POA: Diagnosis not present

## 2016-03-22 DIAGNOSIS — I10 Essential (primary) hypertension: Secondary | ICD-10-CM | POA: Diagnosis not present

## 2016-03-22 LAB — BASIC METABOLIC PANEL
Anion gap: 9 (ref 5–15)
BUN: 8 mg/dL (ref 6–20)
CALCIUM: 8.5 mg/dL — AB (ref 8.9–10.3)
CO2: 23 mmol/L (ref 22–32)
CREATININE: 0.74 mg/dL (ref 0.44–1.00)
Chloride: 102 mmol/L (ref 101–111)
GFR calc non Af Amer: >60 mL/min (ref >60)
Glucose, Bld: 109 mg/dL — ABNORMAL HIGH (ref 65–99)
Potassium: 4 mmol/L (ref 3.5–5.1)
SODIUM: 134 mmol/L — AB (ref 135–145)

## 2016-03-22 MED ORDER — AMIODARONE HCL 200 MG PO TABS: 200.0000 mg | ORAL_TABLET | Freq: Every day | ORAL | Status: DC

## 2016-03-25 ENCOUNTER — Other Ambulatory Visit (HOSPITAL_COMMUNITY): Payer: Medicare Other

## 2016-04-09 ENCOUNTER — Ambulatory Visit (HOSPITAL_COMMUNITY): Payer: Medicare Other | Admitting: Nurse Practitioner

## 2016-04-18 ENCOUNTER — Inpatient Hospital Stay (HOSPITAL_COMMUNITY): Admission: RE | Admit: 2016-04-18 | Payer: Medicare Other | Source: Ambulatory Visit | Admitting: Nurse Practitioner

## 2016-04-25 ENCOUNTER — Encounter (HOSPITAL_COMMUNITY): Payer: Self-pay | Admitting: Nurse Practitioner

## 2016-04-25 ENCOUNTER — Ambulatory Visit (HOSPITAL_COMMUNITY)
Admission: RE | Admit: 2016-04-25 | Discharge: 2016-04-25 | Disposition: A | Discharge status: Home / Self Care | Payer: Medicare Other | Source: Ambulatory Visit | Attending: Nurse Practitioner | Admitting: Nurse Practitioner

## 2016-04-25 VITALS — BP 170/90 | HR 71 | Ht 64.0 in | Wt 174.6 lb

## 2016-04-25 DIAGNOSIS — I480 Paroxysmal atrial fibrillation: Secondary | ICD-10-CM

## 2016-04-25 DIAGNOSIS — I4510 Unspecified right bundle-branch block: Secondary | ICD-10-CM | POA: Insufficient documentation

## 2016-04-25 DIAGNOSIS — I4891 Unspecified atrial fibrillation: Secondary | ICD-10-CM | POA: Diagnosis not present

## 2016-04-25 MED ORDER — AMIODARONE HCL 200 MG PO TABS: 100.0000 mg | ORAL_TABLET | Freq: Every day | ORAL | Status: DC

## 2016-04-25 MED ORDER — METOPROLOL SUCCINATE ER 100 MG PO TB24: ORAL_TABLET | ORAL | Status: DC

## 2016-04-25 NOTE — Progress Notes (Signed)
Patient ID: Amber [name], female   DOB: Oct 28, 1946, 70 y.o.   MRN: [id number]    Primary Care Physician: Precious Reel, MD Referring Physician: Dr. Levon Hedger is a 70 y.o. female with a h/o afib with PVI per Dr. Rayann Heman 03/21/16. She had failed fecanide and was placed on amiodarone 200 mg  Prior to ablation. She has not had any further afib since procedure, no groin issue or swallowing issues. Still anticipating  back surgery later in year. Taking blood thinner as prescribed. Still has labile BP and aftern low in afternoon's. Just took meds prior to appointment.  Today, she denies symptoms of palpitations, chest pain, shortness of breath, orthopnea, PND, lower extremity edema, dizziness, presyncope, syncope, or neurologic sequela. The patient is tolerating medications without difficulties and is otherwise without complaint today.   Past Medical History  Diagnosis Date  . Chest pain     + Tn s/p cath 2012--Normal  . HTN (hypertension)   . Persistent atrial fibrillation (HCC)     three types  . Obesity   . History of tobacco abuse   . Post-menopausal   . Right bundle branch block   . Hyperlipemia   . Abdominal mass     pt unaware  . Arthritis   . Osteoporosis   . IBS (irritable bowel syndrome)    Past Surgical History  Procedure Laterality Date  . Breast reduction surgery      BILATERAL  . Carpal tunnel release    . Tonsillectomy and adenoidectomy  AGE 68  . Ovarian cyst removal  1978  . Cardiac catheterization  02-25-2011    EF 60%. Left coronary artery arises and distributes normal, right coronary artery is a dominant vessel and is normal  . US echocardiography  12-23-2006    EF 55-60%  . Cardiovascular stress test  08-27-2002    EF 76%  . Breast biopsy      left breast- benign  . Loop recorder explant N/A 12/28/2014    Procedure: LOOP RECORDER EXPLANT;  Surgeon: Deboraha Sprang, MD  . Darden Dates without cardioversion N/A 03/20/2016    Procedure: TRANSESOPHAGEAL  ECHOCARDIOGRAM (TEE);  Surgeon: Skeet Latch, MD;  Location: [place];  Service: Cardiovascular;  Laterality: N/A;  . Electrophysiologic study N/A 03/21/2016    Procedure: Atrial Fibrillation Ablation;  Surgeon: Thompson Grayer, MD;  Location: [facility];  Service: Cardiovascular;  Laterality: N/A;    Current Outpatient Prescriptions  Medication Sig Dispense Refill  . acetaminophen (TYLENOL) 500 MG tablet Take 500 mg by mouth daily as needed for mild pain or headache.    Marland Kitchen amiodarone (PACERONE) 200 MG tablet Take 0.5 tablets (100 mg total) by mouth daily. 180 tablet 3  . Cholecalciferol (VITAMIN D PO) Take 1 tablet by mouth daily.     Marland Kitchen ELIQUIS 5 MG TABS tablet TAKE 1 TABLET BY MOUTH TWICE DAILY 60 tablet 11  . esomeprazole (NEXIUM) 40 MG capsule Take 40 mg by mouth daily as needed (heartburn).     . fish oil-omega-3 fatty acids 1000 MG capsule Take 2 g by mouth daily with lunch.     . losartan-hydrochlorothiazide (HYZAAR) 100-25 MG tablet Take 1 tablet by mouth daily. 90 tablet 3  . metoprolol succinate (TOPROL-XL) 100 MG 24 hr tablet Take 1/2 tablet (50mg ) by mouth twice a day 90 tablet 3  . potassium chloride (K-DUR,KLOR-CON) 10 MEQ tablet Take 10 mEq by mouth daily.     . rosuvastatin (CRESTOR) 5  MG tablet Take 5 mg by mouth every other day.     No current facility-administered medications for this encounter.    Allergies  Allergen Reactions  . Ciprofloxacin Itching and Swelling    Social History   Social History  . Marital Status: Married    Spouse Name: N/A  . Number of Children: N/A  . Years of Education: N/A   Occupational History  . Not on file.   Social History Main Topics  . Smoking status: Never Smoker   . Smokeless tobacco: Never Used  . Alcohol Use: 4.2 oz/week    7 Standard drinks or equivalent per week     Comment: 1-2 wine or burboun per day  . Drug Use: No  . Sexual Activity: Not on file   Other Topics Concern  . Not on file   Social  History Narrative   Pt lives in [place] with spouse.  Retired Chief Technology Officer.    Family History  Problem Relation Age of Onset  . Prostate cancer Father   . Heart failure Father   . Atrial fibrillation Mother   . Diabetes Father     ROS- All systems are reviewed and negative except as per the HPI above  Physical Exam: Filed Vitals:   04/25/16 1042  BP: 170/90  Pulse: 71  Height: 5\' 4"  (1.626 m)  Weight: 174 lb 9.6 oz (79.198 kg)    GEN- The patient is well appearing, alert and oriented x 3 today.   Head- normocephalic, atraumatic Eyes-  Sclera clear, conjunctiva pink Ears- hearing intact Oropharynx- clear Neck- supple, no JVP Lymph- no cervical lymphadenopathy Lungs- Clear to ausculation bilaterally, normal work of breathing Heart- Regular rate and rhythm, no murmurs, rubs or gallops, PMI not laterally displaced GI- soft, NT, ND, + BS Extremities- no clubbing, cyanosis, or edema MS- no significant deformity or atrophy Skin- no rash or lesion Psych- euthymic mood, full affect Neuro- strength and sensation are intact  EKG- NSR pr int 152 ms, qrs int 94 ms, qtc 445 ms Epic records   Assessment and Plan: 1. afib  Staying in SR with amiodarone/ablation but pt would like to stop drug or at least cut in half. Reduce to 100 mg a day Continue eliquis  2. HTN Continue meds but with low BP in afternoons, cut metoprolol 100 mg in half and take 50 mg bid Avoid salt  F/u Dr. Rayann Heman as scheduled 7/10  Butch Penny C. Candace Ramus, [facility] [address] [place], [place] [address] [phone]

## 2016-04-25 NOTE — Patient Instructions (Signed)
Your physician has recommended you make the following change in your medication:  1)Decrease Amiodarone to 1/2 tablet once a day (100mg ) 2)Split Metoprolol take 1/2 in the morning and 1/2 in the evening

## 2016-05-01 ENCOUNTER — Telehealth (HOSPITAL_COMMUNITY): Payer: Self-pay | Admitting: *Deleted

## 2016-05-01 MED ORDER — AMIODARONE HCL 200 MG PO TABS: 200.0000 mg | ORAL_TABLET | Freq: Every day | ORAL | Status: DC

## 2016-05-01 NOTE — Telephone Encounter (Signed)
Pt called stating yesterday she went into afib and stayed in for about 12 hours. First time since her ablation this has happened. She decided to increase her amiodarone back to 200mg  once a day until she sees Dr. Rayann Heman.  Reassured patient that due the first 3 months after ablation it is not abnormal to have afib during the healing phase and it appropriate to stay on 200mg  daily until 3 month follow up with Dr. Rayann Heman. Patient verbalized understanding.

## 2016-05-10 DIAGNOSIS — H524 Presbyopia: Secondary | ICD-10-CM | POA: Diagnosis not present

## 2016-05-10 DIAGNOSIS — Z961 Presence of intraocular lens: Secondary | ICD-10-CM | POA: Diagnosis not present

## 2016-06-04 DIAGNOSIS — T1512XA Foreign body in conjunctival sac, left eye, initial encounter: Secondary | ICD-10-CM | POA: Diagnosis not present

## 2016-06-11 ENCOUNTER — Ambulatory Visit: Payer: Medicare Other | Admitting: Internal Medicine

## 2016-06-21 DIAGNOSIS — M25552 Pain in left hip: Secondary | ICD-10-CM | POA: Diagnosis not present

## 2016-06-21 DIAGNOSIS — M7062 Trochanteric bursitis, left hip: Secondary | ICD-10-CM | POA: Diagnosis not present

## 2016-06-21 DIAGNOSIS — M25551 Pain in right hip: Secondary | ICD-10-CM | POA: Diagnosis not present

## 2016-06-21 DIAGNOSIS — M7061 Trochanteric bursitis, right hip: Secondary | ICD-10-CM | POA: Diagnosis not present

## 2016-06-24 ENCOUNTER — Ambulatory Visit (INDEPENDENT_AMBULATORY_CARE_PROVIDER_SITE_OTHER): Payer: Medicare Other | Admitting: Internal Medicine

## 2016-06-24 ENCOUNTER — Ambulatory Visit: Payer: Medicare Other | Admitting: Internal Medicine

## 2016-06-24 ENCOUNTER — Encounter: Payer: Self-pay | Admitting: Internal Medicine

## 2016-06-24 VITALS — BP 174/86 | HR 72 | Ht 64.0 in | Wt 174.2 lb

## 2016-06-24 DIAGNOSIS — I10 Essential (primary) hypertension: Secondary | ICD-10-CM | POA: Diagnosis not present

## 2016-06-24 DIAGNOSIS — I481 Persistent atrial fibrillation: Secondary | ICD-10-CM

## 2016-06-24 DIAGNOSIS — Z0000 Encounter for general adult medical examination without abnormal findings: Secondary | ICD-10-CM | POA: Diagnosis not present

## 2016-06-24 DIAGNOSIS — I4819 Other persistent atrial fibrillation: Secondary | ICD-10-CM

## 2016-06-24 DIAGNOSIS — E871 Hypo-osmolality and hyponatremia: Secondary | ICD-10-CM | POA: Diagnosis not present

## 2016-06-24 DIAGNOSIS — I480 Paroxysmal atrial fibrillation: Secondary | ICD-10-CM | POA: Diagnosis not present

## 2016-06-24 DIAGNOSIS — E876 Hypokalemia: Secondary | ICD-10-CM | POA: Diagnosis not present

## 2016-06-24 DIAGNOSIS — Z6830 Body mass index (BMI) 30.0-30.9, adult: Secondary | ICD-10-CM | POA: Diagnosis not present

## 2016-06-24 DIAGNOSIS — R8299 Other abnormal findings in urine: Secondary | ICD-10-CM | POA: Diagnosis not present

## 2016-06-24 DIAGNOSIS — R7301 Impaired fasting glucose: Secondary | ICD-10-CM | POA: Diagnosis not present

## 2016-06-24 DIAGNOSIS — M859 Disorder of bone density and structure, unspecified: Secondary | ICD-10-CM | POA: Diagnosis not present

## 2016-06-24 DIAGNOSIS — R600 Localized edema: Secondary | ICD-10-CM | POA: Diagnosis not present

## 2016-06-24 DIAGNOSIS — R808 Other proteinuria: Secondary | ICD-10-CM | POA: Diagnosis not present

## 2016-06-24 DIAGNOSIS — E784 Other hyperlipidemia: Secondary | ICD-10-CM | POA: Diagnosis not present

## 2016-06-24 DIAGNOSIS — Z1389 Encounter for screening for other disorder: Secondary | ICD-10-CM | POA: Diagnosis not present

## 2016-06-24 DIAGNOSIS — N390 Urinary tract infection, site not specified: Secondary | ICD-10-CM | POA: Diagnosis not present

## 2016-06-24 DIAGNOSIS — D72819 Decreased white blood cell count, unspecified: Secondary | ICD-10-CM | POA: Diagnosis not present

## 2016-06-24 DIAGNOSIS — M7071 Other bursitis of hip, right hip: Secondary | ICD-10-CM | POA: Diagnosis not present

## 2016-06-24 DIAGNOSIS — M545 Low back pain: Secondary | ICD-10-CM | POA: Diagnosis not present

## 2016-06-24 MED ORDER — AMLODIPINE BESYLATE 5 MG PO TABS: 5.0000 mg | ORAL_TABLET | Freq: Every day | ORAL | Status: DC

## 2016-06-24 NOTE — Patient Instructions (Signed)
Your physician has recommended you make the following change in your medication:  1. Stop amiodarone 2. Start amlodipine (Norvasc) 5 mg once daily  Your physician wants you to follow-up in: 3 months with Dr. Rayann Heman.  You will receive a reminder letter in the mail two months in advance. If you don't receive a letter, please call our office to schedule the follow-up appointment.

## 2016-06-24 NOTE — Progress Notes (Signed)
Electrophysiology Office Note   Date:  06/24/2016   ID:  ONI ORE, DOB 1946-06-27, MRN [id number]  PCP:  Precious Reel, MD  Primary Electrophysiologist: Dr Caryl Comes  Chief Complaint  Patient presents with  . Atrial Fibrillation     History of Present Illness: Amber [name] is a 70 y.o. female who presents today for electrophysiology evaluation.  She has done well since her afib ablation.  She denies procedure related complications and is pleased with results.  She has rare palpitations which are short.   She continues to have labile blood pressures.    Today, she denies symptoms of palpitations, chest pain, shortness of breath, orthopnea, PND, lower extremity edema, claudication, dizziness, presyncope, syncope, bleeding, or neurologic sequela. The patient is tolerating medications without difficulties and is otherwise without complaint today.    Past Medical History  Diagnosis Date  . Chest pain     + Tn s/p cath 2012--Normal  . HTN (hypertension)   . Persistent atrial fibrillation (HCC)     three types  . Obesity   . History of tobacco abuse   . Post-menopausal   . Right bundle branch block   . Hyperlipemia   . Abdominal mass     pt unaware  . Arthritis   . Osteoporosis   . IBS (irritable bowel syndrome)    Past Surgical History  Procedure Laterality Date  . Breast reduction surgery      BILATERAL  . Carpal tunnel release    . Tonsillectomy and adenoidectomy  AGE 69  . Ovarian cyst removal  1978  . Cardiac catheterization  02-25-2011    EF 60%. Left coronary artery arises and distributes normal, right coronary artery is a dominant vessel and is normal  . US echocardiography  12-23-2006    EF 55-60%  . Cardiovascular stress test  08-27-2002    EF 76%  . Breast biopsy      left breast- benign  . Loop recorder explant N/A 12/28/2014    Procedure: LOOP RECORDER EXPLANT;  Surgeon: Deboraha Sprang, MD  . Darden Dates without cardioversion N/A 03/20/2016    Procedure:  TRANSESOPHAGEAL ECHOCARDIOGRAM (TEE);  Surgeon: Skeet Latch, MD;  Location: [facility];  Service: Cardiovascular;  Laterality: N/A;  . Electrophysiologic study N/A 03/21/2016    Procedure: Atrial Fibrillation Ablation;  Surgeon: Thompson Grayer, MD;  Location: [facility];  Service: Cardiovascular;  Laterality: N/A;     Current Outpatient Prescriptions  Medication Sig Dispense Refill  . acetaminophen (TYLENOL) 500 MG tablet Take 500 mg by mouth daily as needed for mild pain or headache.    . Cholecalciferol (VITAMIN D PO) Take 1 tablet by mouth daily.     Marland Kitchen ELIQUIS 5 MG TABS tablet TAKE 1 TABLET BY MOUTH TWICE DAILY 60 tablet 11  . esomeprazole (NEXIUM) 40 MG capsule Take 40 mg by mouth daily as needed (heartburn).     . fish oil-omega-3 fatty acids 1000 MG capsule Take 2 g by mouth daily with lunch.     . fluticasone (FLONASE) 50 MCG/ACT nasal spray Place 2 sprays into both nostrils daily as needed. Allergies  2  . losartan-hydrochlorothiazide (HYZAAR) 100-25 MG tablet Take 1 tablet by mouth daily. 90 tablet 3  . metoprolol succinate (TOPROL-XL) 100 MG 24 hr tablet Take 100 mg by mouth every morning. Take with or immediately following a meal.    . potassium chloride (K-DUR,KLOR-CON) 10 MEQ tablet Take 10 mEq by mouth daily.     Marland Kitchen  rosuvastatin (CRESTOR) 5 MG tablet Take 5 mg by mouth every other day.     No current facility-administered medications for this visit.    Allergies:   Ciprofloxacin   Social History:  The patient  reports that she has never smoked. She has never used smokeless tobacco. She reports that she drinks about 4.2 oz of alcohol per week. She reports that she does not use illicit drugs.   Family History:  The patient's  family history includes Atrial fibrillation in her mother; Diabetes in her father; Heart failure in her father; Prostate cancer in her father.    ROS:  Please see the history of present illness.   All other systems are reviewed and negative.     PHYSICAL EXAM: VS:  BP 174/86 mmHg  Pulse 72  Ht 5\' 4"  (1.626 m)  Wt 174 lb 3.2 oz (79.017 kg)  BMI 29.89 kg/m2  SpO2 99% , BMI Body mass index is 29.89 kg/(m^2). GEN: Well nourished, well developed, in no acute distress HEENT: normal Neck: no JVD, carotid bruits, or masses Cardiac: RRR; no murmurs, rubs, or gallops,no edema  Respiratory:  clear to auscultation bilaterally, normal work of breathing GI: soft, nontender, nondistended, + BS MS: no deformity or atrophy Skin: warm and dry  Neuro:  Strength and sensation are intact Psych: euthymic mood, full affect  EKG:  EKG is ordered today. The ekg ordered today shows sinus rhythm 72 bpm, RBBB, nonspecific ST/T changes   Lipid Panel     Component Value Date/Time   CHOL 192 04/15/2012 1004   TRIG 61.0 04/15/2012 1004   HDL 93.70 04/15/2012 1004   CHOLHDL 2 04/15/2012 1004   VLDL 12.2 04/15/2012 1004   LDLCALC 86 04/15/2012 1004     Wt Readings from Last 3 Encounters:  06/24/16 174 lb 3.2 oz (79.017 kg)  04/25/16 174 lb 9.6 oz (79.198 kg)  03/21/16 171 lb (77.565 kg)     ASSESSMENT AND PLAN:  1.  Persistent afib Doing well s/p ablation Stop amiodarone Continue long term anticoagulation  2. HTN Elevated The importance of BP control to maintaining sinus rhythm post ablation (ARREST AF) was discussed today Add norvasc 5mg  daily 2 gram sodium diet  3. overweight  Body mass index is 29.89 kg/(m^2).  Weight loss is advised  Return to see me in 3 months  Signed, Thompson Grayer, MD  06/24/2016 11:22 AM     [address] [facility] [address] [place] [address] [phone] (office) [phone] (fax)

## 2016-06-26 ENCOUNTER — Encounter: Payer: Self-pay | Admitting: Internal Medicine

## 2016-07-04 ENCOUNTER — Telehealth: Payer: Self-pay | Admitting: Internal Medicine

## 2016-07-04 NOTE — Telephone Encounter (Signed)
Pt on Eliquis for afib with CHADS2 score of 1 (HTN). Ok to hold Eliquis for 2-3 days as needed prior to procedure.

## 2016-07-04 NOTE — Telephone Encounter (Signed)
Will route to Sally/ Meagan for Eliquis and Dr Rayann Heman for cardiac clearance

## 2016-07-04 NOTE — Telephone Encounter (Signed)
Request for surgical clearance:  1. What type of surgery is being performed? Back Surgery 2. When is this surgery scheduled? Have not been scheduled   2. Are there any medications that need to be held prior to surgery and how long?Can she stop her Eliquis? If so how long?  3. Name of physician performing surgery? Dr Jenne Campus   4. What is your office phone and fax number? [phone]  And fax number is [phone]  5.

## 2016-07-09 ENCOUNTER — Encounter: Payer: Self-pay | Admitting: Internal Medicine

## 2016-07-18 ENCOUNTER — Other Ambulatory Visit: Payer: Self-pay | Admitting: Internal Medicine

## 2016-07-23 DIAGNOSIS — I480 Paroxysmal atrial fibrillation: Secondary | ICD-10-CM | POA: Diagnosis not present

## 2016-07-23 DIAGNOSIS — D689 Coagulation defect, unspecified: Secondary | ICD-10-CM | POA: Diagnosis not present

## 2016-07-23 DIAGNOSIS — M545 Low back pain: Secondary | ICD-10-CM | POA: Diagnosis not present

## 2016-07-23 DIAGNOSIS — Z6830 Body mass index (BMI) 30.0-30.9, adult: Secondary | ICD-10-CM | POA: Diagnosis not present

## 2016-07-23 DIAGNOSIS — K219 Gastro-esophageal reflux disease without esophagitis: Secondary | ICD-10-CM | POA: Diagnosis not present

## 2016-07-24 DIAGNOSIS — Z86010 Personal history of colonic polyps: Secondary | ICD-10-CM | POA: Diagnosis not present

## 2016-07-24 DIAGNOSIS — K219 Gastro-esophageal reflux disease without esophagitis: Secondary | ICD-10-CM | POA: Diagnosis not present

## 2016-07-24 DIAGNOSIS — K920 Hematemesis: Secondary | ICD-10-CM | POA: Diagnosis not present

## 2016-07-31 DIAGNOSIS — K920 Hematemesis: Secondary | ICD-10-CM | POA: Diagnosis not present

## 2016-08-26 ENCOUNTER — Other Ambulatory Visit: Payer: Self-pay | Admitting: Internal Medicine

## 2016-08-26 DIAGNOSIS — Z1231 Encounter for screening mammogram for malignant neoplasm of breast: Secondary | ICD-10-CM

## 2016-09-06 DIAGNOSIS — Z23 Encounter for immunization: Secondary | ICD-10-CM | POA: Diagnosis not present

## 2016-09-11 DIAGNOSIS — Z87891 Personal history of nicotine dependence: Secondary | ICD-10-CM | POA: Diagnosis not present

## 2016-09-11 DIAGNOSIS — M4056 Lordosis, unspecified, lumbar region: Secondary | ICD-10-CM | POA: Diagnosis not present

## 2016-09-11 DIAGNOSIS — M48062 Spinal stenosis, lumbar region with neurogenic claudication: Secondary | ICD-10-CM | POA: Insufficient documentation

## 2016-09-11 DIAGNOSIS — M4316 Spondylolisthesis, lumbar region: Secondary | ICD-10-CM | POA: Insufficient documentation

## 2016-09-11 DIAGNOSIS — I4891 Unspecified atrial fibrillation: Secondary | ICD-10-CM | POA: Diagnosis not present

## 2016-09-11 DIAGNOSIS — M4806 Spinal stenosis, lumbar region: Secondary | ICD-10-CM | POA: Diagnosis not present

## 2016-09-11 DIAGNOSIS — Z7901 Long term (current) use of anticoagulants: Secondary | ICD-10-CM | POA: Diagnosis not present

## 2016-09-11 DIAGNOSIS — M40205 Unspecified kyphosis, thoracolumbar region: Secondary | ICD-10-CM | POA: Diagnosis not present

## 2016-09-26 ENCOUNTER — Encounter: Payer: Self-pay | Admitting: Internal Medicine

## 2016-09-26 ENCOUNTER — Ambulatory Visit (INDEPENDENT_AMBULATORY_CARE_PROVIDER_SITE_OTHER): Payer: Medicare Other | Admitting: Internal Medicine

## 2016-09-26 VITALS — BP 152/80 | HR 89 | Ht 64.0 in | Wt 178.4 lb

## 2016-09-26 DIAGNOSIS — I10 Essential (primary) hypertension: Secondary | ICD-10-CM | POA: Diagnosis not present

## 2016-09-26 DIAGNOSIS — I4819 Other persistent atrial fibrillation: Secondary | ICD-10-CM

## 2016-09-26 DIAGNOSIS — I481 Persistent atrial fibrillation: Secondary | ICD-10-CM | POA: Diagnosis not present

## 2016-09-26 DIAGNOSIS — I480 Paroxysmal atrial fibrillation: Secondary | ICD-10-CM

## 2016-09-26 MED ORDER — SPIRONOLACTONE 25 MG PO TABS: 25.0000 mg | ORAL_TABLET | Freq: Every day | ORAL | 3 refills | Status: DC

## 2016-09-26 NOTE — Patient Instructions (Signed)
Medication Instructions:  Your physician has recommended you make the following change in your medication:  1) Stop Potassium 2) Start Spironolactone 25 mg daily   Labwork: None ordered   Testing/Procedures: None ordered   Follow-Up: Your physician recommends that you schedule a follow-up appointment in: 3 months with Dr Rayann Heman   Any Other Special Instructions Will Be Listed Below (If Applicable).     If you need a refill on your cardiac medications before your next appointment, please call your pharmacy.

## 2016-09-26 NOTE — Progress Notes (Signed)
Electrophysiology Office Note   Date:  09/26/2016   ID:  Amber [name], DOB 13-Jan-1946, MRN [id number]  PCP:  Precious Reel, MD  Primary Electrophysiologist: Dr Caryl Comes  Chief Complaint  Patient presents with  . Atrial Fibrillation     History of Present Illness: Amber [name] is a 70 y.o. female who presents today for electrophysiology evaluation.  She has done well since her last visit.  She has rare palpitations which are short.   She continues to have labile blood pressures.  She has had some swelling with norvasc.  Her primary concern is with back issues and upcoming back surgery.  Today, she denies symptoms of palpitations, chest pain, shortness of breath, orthopnea, PND, claudication, dizziness, presyncope, syncope, bleeding, or neurologic sequela. The patient is tolerating medications without difficulties and is otherwise without complaint today.    Past Medical History:  Diagnosis Date  . Abdominal mass    pt unaware  . Arthritis   . Chest pain    + Tn s/p cath 2012--Normal  . History of tobacco abuse   . HTN (hypertension)   . Hyperlipemia   . IBS (irritable bowel syndrome)   . Obesity   . Osteoporosis   . Persistent atrial fibrillation (HCC)    three types  . Post-menopausal   . Right bundle branch block    Past Surgical History:  Procedure Laterality Date  . BREAST BIOPSY     left breast- benign  . BREAST REDUCTION SURGERY     BILATERAL  . CARDIAC CATHETERIZATION  02-25-2011   EF 60%. Left coronary artery arises and distributes normal, right coronary artery is a dominant vessel and is normal  . CARDIOVASCULAR STRESS TEST  08-27-2002   EF 76%  . CARPAL TUNNEL RELEASE    . ELECTROPHYSIOLOGIC STUDY N/A 03/21/2016   Procedure: Atrial Fibrillation Ablation;  Surgeon: Thompson Grayer, MD;  Location: [facility];  Service: Cardiovascular;  Laterality: N/A;  . LOOP RECORDER EXPLANT N/A 12/28/2014   Procedure: LOOP RECORDER EXPLANT;  Surgeon: Deboraha Sprang, MD  . OVARIAN CYST REMOVAL  1978  . TEE WITHOUT CARDIOVERSION N/A 03/20/2016   Procedure: TRANSESOPHAGEAL ECHOCARDIOGRAM (TEE);  Surgeon: Skeet Latch, MD;  Location: [facility];  Service: Cardiovascular;  Laterality: N/A;  . TONSILLECTOMY AND ADENOIDECTOMY  AGE 41  . US ECHOCARDIOGRAPHY  12-23-2006   EF 55-60%     Current Outpatient Prescriptions  Medication Sig Dispense Refill  . acetaminophen (TYLENOL) 500 MG tablet Take 500 mg by mouth daily as needed for mild pain or headache.    Marland Kitchen amLODipine (NORVASC) 5 MG tablet Take 1 tablet (5 mg total) by mouth daily. 180 tablet 3  . Cholecalciferol (VITAMIN D PO) Take 1 tablet by mouth daily.     Marland Kitchen ELIQUIS 5 MG TABS tablet TAKE 1 TABLET BY MOUTH TWICE DAILY 180 tablet 3  . esomeprazole (NEXIUM) 40 MG capsule Take 40 mg by mouth daily as needed (heartburn).     . fish oil-omega-3 fatty acids 1000 MG capsule Take 2 g by mouth daily with lunch.     . losartan-hydrochlorothiazide (HYZAAR) 100-25 MG tablet Take 1 tablet by mouth daily. 90 tablet 3  . metoprolol succinate (TOPROL-XL) 100 MG 24 hr tablet Take 100 mg by mouth every morning. Take with or immediately following a meal.    . rosuvastatin (CRESTOR) 5 MG tablet Take 5 mg by mouth every other day.    . spironolactone (ALDACTONE) 25 MG tablet Take  1 tablet (25 mg total) by mouth daily. 90 tablet 3   No current facility-administered medications for this visit.     Allergies:   Ciprofloxacin   Social History:  The patient  reports that she quit smoking about 47 years ago. Her smoking use included Cigarettes. She started smoking about 48 years ago. She quit after 1.00 year of use. She has never used smokeless tobacco. She reports that she drinks about 4.2 oz of alcohol per week . She reports that she does not use drugs.   Family History:  The patient's  family history includes Atrial fibrillation in her mother; Diabetes in her father; Heart failure in her father; Prostate cancer in her  father.    ROS:  Please see the history of present illness.   All other systems are reviewed and negative.    PHYSICAL EXAM: VS:  BP (!) 152/80   Pulse 89   Ht 5\' 4"  (1.626 m)   Wt 178 lb 6.4 oz (80.9 kg)   BMI 30.62 kg/m  , BMI Body mass index is 30.62 kg/m. GEN: Well nourished, well developed, in no acute distress  HEENT: normal  Neck: no JVD, carotid bruits, or masses Cardiac: RRR; no murmurs, rubs, or gallops,no edema  Respiratory:  clear to auscultation bilaterally, normal work of breathing GI: soft, nontender, nondistended, + BS MS: no deformity or atrophy  Skin: warm and dry  Neuro:  Strength and sensation are intact Psych: euthymic mood, full affect  EKG:  EKG is ordered today. The ekg ordered today shows sinus rhythm 89 bpm, RBBB, nonspecific ST/T changes   Lipid Panel     Component Value Date/Time   CHOL 192 04/15/2012 1004   TRIG 61.0 04/15/2012 1004   HDL 93.70 04/15/2012 1004   CHOLHDL 2 04/15/2012 1004   VLDL 12.2 04/15/2012 1004   LDLCALC 86 04/15/2012 1004     Wt Readings from Last 3 Encounters:  09/26/16 178 lb 6.4 oz (80.9 kg)  06/24/16 174 lb 3.2 oz (79 kg)  04/25/16 174 lb 9.6 oz (79.2 kg)     ASSESSMENT AND PLAN:  1.  Persistent afib Doing well s/p ablation off AAD therapy Continue long term anticoagulation  2. HTN Elevated The importance of BP control to maintaining sinus rhythm post ablation (ARREST AF) was discussed today Add norvasc 5mg  daily 2 gram sodium diet  3. overweight  Body mass index is 30.62 kg/m.  Weight loss is advised  Return to see me in 3 months  Signed, Thompson Grayer, MD  09/26/2016 2:07 PM     [place] [facility] [place] [address] [phone] (office) [phone] (fax)

## 2016-09-30 ENCOUNTER — Encounter: Payer: Self-pay | Admitting: Internal Medicine

## 2016-09-30 DIAGNOSIS — I480 Paroxysmal atrial fibrillation: Secondary | ICD-10-CM

## 2016-10-02 NOTE — Telephone Encounter (Signed)
I spoke with pt and she will come in for BMP on Friday morning--10/04/16

## 2016-10-02 NOTE — Telephone Encounter (Signed)
Follow Up  Pt voiced she is needing an answer soon and she only has a few days left for surgery.  Please f/u with pt.

## 2016-10-04 ENCOUNTER — Other Ambulatory Visit: Payer: Medicare Other | Admitting: *Deleted

## 2016-10-04 DIAGNOSIS — I480 Paroxysmal atrial fibrillation: Secondary | ICD-10-CM | POA: Diagnosis not present

## 2016-10-04 LAB — BASIC METABOLIC PANEL
BUN: 14 mg/dL (ref 7–25)
CHLORIDE: 92 mmol/L — AB (ref 98–110)
CO2: 24 mmol/L (ref 20–31)
CREATININE: 0.77 mg/dL (ref 0.60–0.93)
Calcium: 9.7 mg/dL (ref 8.6–10.4)
GLUCOSE: 107 mg/dL — AB (ref 65–99)
POTASSIUM: 4.3 mmol/L (ref 3.5–5.3)
Sodium: 130 mmol/L — ABNORMAL LOW (ref 135–146)

## 2016-10-07 DIAGNOSIS — M5137 Other intervertebral disc degeneration, lumbosacral region: Secondary | ICD-10-CM | POA: Diagnosis not present

## 2016-10-07 DIAGNOSIS — M4807 Spinal stenosis, lumbosacral region: Secondary | ICD-10-CM | POA: Diagnosis not present

## 2016-10-07 DIAGNOSIS — Z87891 Personal history of nicotine dependence: Secondary | ICD-10-CM | POA: Diagnosis not present

## 2016-10-07 DIAGNOSIS — R300 Dysuria: Secondary | ICD-10-CM | POA: Diagnosis not present

## 2016-10-07 DIAGNOSIS — E785 Hyperlipidemia, unspecified: Secondary | ICD-10-CM | POA: Diagnosis present

## 2016-10-07 DIAGNOSIS — Z881 Allergy status to other antibiotic agents status: Secondary | ICD-10-CM | POA: Diagnosis not present

## 2016-10-07 DIAGNOSIS — K219 Gastro-esophageal reflux disease without esophagitis: Secondary | ICD-10-CM | POA: Diagnosis present

## 2016-10-07 DIAGNOSIS — M48062 Spinal stenosis, lumbar region with neurogenic claudication: Secondary | ICD-10-CM | POA: Diagnosis not present

## 2016-10-07 DIAGNOSIS — M4316 Spondylolisthesis, lumbar region: Secondary | ICD-10-CM | POA: Diagnosis present

## 2016-10-07 DIAGNOSIS — M5136 Other intervertebral disc degeneration, lumbar region: Secondary | ICD-10-CM | POA: Diagnosis present

## 2016-10-07 DIAGNOSIS — I10 Essential (primary) hypertension: Secondary | ICD-10-CM | POA: Diagnosis present

## 2016-10-07 DIAGNOSIS — Z7901 Long term (current) use of anticoagulants: Secondary | ICD-10-CM | POA: Diagnosis not present

## 2016-10-07 DIAGNOSIS — I480 Paroxysmal atrial fibrillation: Secondary | ICD-10-CM | POA: Diagnosis present

## 2016-10-07 DIAGNOSIS — R3911 Hesitancy of micturition: Secondary | ICD-10-CM | POA: Diagnosis not present

## 2016-10-13 DIAGNOSIS — I10 Essential (primary) hypertension: Secondary | ICD-10-CM | POA: Diagnosis not present

## 2016-10-13 DIAGNOSIS — E785 Hyperlipidemia, unspecified: Secondary | ICD-10-CM | POA: Diagnosis not present

## 2016-10-13 DIAGNOSIS — I4891 Unspecified atrial fibrillation: Secondary | ICD-10-CM | POA: Diagnosis not present

## 2016-10-13 DIAGNOSIS — Z4789 Encounter for other orthopedic aftercare: Secondary | ICD-10-CM | POA: Diagnosis not present

## 2016-10-15 DIAGNOSIS — E785 Hyperlipidemia, unspecified: Secondary | ICD-10-CM | POA: Diagnosis not present

## 2016-10-15 DIAGNOSIS — I4891 Unspecified atrial fibrillation: Secondary | ICD-10-CM | POA: Diagnosis not present

## 2016-10-15 DIAGNOSIS — I10 Essential (primary) hypertension: Secondary | ICD-10-CM | POA: Diagnosis not present

## 2016-10-15 DIAGNOSIS — Z4789 Encounter for other orthopedic aftercare: Secondary | ICD-10-CM | POA: Diagnosis not present

## 2016-10-17 DIAGNOSIS — I4891 Unspecified atrial fibrillation: Secondary | ICD-10-CM | POA: Diagnosis not present

## 2016-10-17 DIAGNOSIS — I10 Essential (primary) hypertension: Secondary | ICD-10-CM | POA: Diagnosis not present

## 2016-10-17 DIAGNOSIS — E785 Hyperlipidemia, unspecified: Secondary | ICD-10-CM | POA: Diagnosis not present

## 2016-10-17 DIAGNOSIS — Z4789 Encounter for other orthopedic aftercare: Secondary | ICD-10-CM | POA: Diagnosis not present

## 2016-10-18 DIAGNOSIS — Z4789 Encounter for other orthopedic aftercare: Secondary | ICD-10-CM | POA: Diagnosis not present

## 2016-10-18 DIAGNOSIS — I4891 Unspecified atrial fibrillation: Secondary | ICD-10-CM | POA: Diagnosis not present

## 2016-10-18 DIAGNOSIS — I10 Essential (primary) hypertension: Secondary | ICD-10-CM | POA: Diagnosis not present

## 2016-10-18 DIAGNOSIS — E785 Hyperlipidemia, unspecified: Secondary | ICD-10-CM | POA: Diagnosis not present

## 2016-10-21 DIAGNOSIS — R202 Paresthesia of skin: Secondary | ICD-10-CM | POA: Diagnosis not present

## 2016-10-21 DIAGNOSIS — Z4789 Encounter for other orthopedic aftercare: Secondary | ICD-10-CM | POA: Diagnosis not present

## 2016-10-21 DIAGNOSIS — Z981 Arthrodesis status: Secondary | ICD-10-CM | POA: Insufficient documentation

## 2016-10-21 DIAGNOSIS — Z87891 Personal history of nicotine dependence: Secondary | ICD-10-CM | POA: Diagnosis not present

## 2016-10-21 DIAGNOSIS — M549 Dorsalgia, unspecified: Secondary | ICD-10-CM | POA: Diagnosis not present

## 2016-10-22 DIAGNOSIS — Z4789 Encounter for other orthopedic aftercare: Secondary | ICD-10-CM | POA: Diagnosis not present

## 2016-10-22 DIAGNOSIS — I10 Essential (primary) hypertension: Secondary | ICD-10-CM | POA: Diagnosis not present

## 2016-10-22 DIAGNOSIS — E785 Hyperlipidemia, unspecified: Secondary | ICD-10-CM | POA: Diagnosis not present

## 2016-10-22 DIAGNOSIS — I4891 Unspecified atrial fibrillation: Secondary | ICD-10-CM | POA: Diagnosis not present

## 2016-10-24 DIAGNOSIS — I4891 Unspecified atrial fibrillation: Secondary | ICD-10-CM | POA: Diagnosis not present

## 2016-10-24 DIAGNOSIS — I10 Essential (primary) hypertension: Secondary | ICD-10-CM | POA: Diagnosis not present

## 2016-10-24 DIAGNOSIS — Z4789 Encounter for other orthopedic aftercare: Secondary | ICD-10-CM | POA: Diagnosis not present

## 2016-10-24 DIAGNOSIS — E785 Hyperlipidemia, unspecified: Secondary | ICD-10-CM | POA: Diagnosis not present

## 2016-10-29 DIAGNOSIS — Z4789 Encounter for other orthopedic aftercare: Secondary | ICD-10-CM | POA: Diagnosis not present

## 2016-10-29 DIAGNOSIS — E785 Hyperlipidemia, unspecified: Secondary | ICD-10-CM | POA: Diagnosis not present

## 2016-10-29 DIAGNOSIS — I4891 Unspecified atrial fibrillation: Secondary | ICD-10-CM | POA: Diagnosis not present

## 2016-10-29 DIAGNOSIS — I10 Essential (primary) hypertension: Secondary | ICD-10-CM | POA: Diagnosis not present

## 2016-10-31 ENCOUNTER — Ambulatory Visit: Payer: Medicare Other

## 2016-10-31 DIAGNOSIS — I4891 Unspecified atrial fibrillation: Secondary | ICD-10-CM | POA: Diagnosis not present

## 2016-10-31 DIAGNOSIS — Z4789 Encounter for other orthopedic aftercare: Secondary | ICD-10-CM | POA: Diagnosis not present

## 2016-10-31 DIAGNOSIS — I10 Essential (primary) hypertension: Secondary | ICD-10-CM | POA: Diagnosis not present

## 2016-10-31 DIAGNOSIS — E785 Hyperlipidemia, unspecified: Secondary | ICD-10-CM | POA: Diagnosis not present

## 2016-11-03 DIAGNOSIS — E785 Hyperlipidemia, unspecified: Secondary | ICD-10-CM | POA: Diagnosis not present

## 2016-11-03 DIAGNOSIS — I10 Essential (primary) hypertension: Secondary | ICD-10-CM | POA: Diagnosis not present

## 2016-11-03 DIAGNOSIS — I4891 Unspecified atrial fibrillation: Secondary | ICD-10-CM | POA: Diagnosis not present

## 2016-11-03 DIAGNOSIS — Z4789 Encounter for other orthopedic aftercare: Secondary | ICD-10-CM | POA: Diagnosis not present

## 2016-11-05 DIAGNOSIS — I10 Essential (primary) hypertension: Secondary | ICD-10-CM | POA: Diagnosis not present

## 2016-11-05 DIAGNOSIS — E785 Hyperlipidemia, unspecified: Secondary | ICD-10-CM | POA: Diagnosis not present

## 2016-11-05 DIAGNOSIS — Z4789 Encounter for other orthopedic aftercare: Secondary | ICD-10-CM | POA: Diagnosis not present

## 2016-11-05 DIAGNOSIS — I4891 Unspecified atrial fibrillation: Secondary | ICD-10-CM | POA: Diagnosis not present

## 2016-11-11 DIAGNOSIS — Z9181 History of falling: Secondary | ICD-10-CM | POA: Diagnosis not present

## 2016-11-11 DIAGNOSIS — Z87891 Personal history of nicotine dependence: Secondary | ICD-10-CM | POA: Diagnosis not present

## 2016-11-11 DIAGNOSIS — Z981 Arthrodesis status: Secondary | ICD-10-CM | POA: Diagnosis not present

## 2016-11-11 DIAGNOSIS — M549 Dorsalgia, unspecified: Secondary | ICD-10-CM | POA: Diagnosis not present

## 2016-11-11 DIAGNOSIS — Z4789 Encounter for other orthopedic aftercare: Secondary | ICD-10-CM | POA: Diagnosis not present

## 2016-11-21 ENCOUNTER — Ambulatory Visit: Payer: Medicare Other | Attending: Internal Medicine | Admitting: Physical Therapy

## 2016-11-21 DIAGNOSIS — M6281 Muscle weakness (generalized): Secondary | ICD-10-CM | POA: Diagnosis not present

## 2016-11-21 DIAGNOSIS — M545 Low back pain, unspecified: Secondary | ICD-10-CM

## 2016-11-21 DIAGNOSIS — R262 Difficulty in walking, not elsewhere classified: Secondary | ICD-10-CM | POA: Diagnosis not present

## 2016-11-21 DIAGNOSIS — R293 Abnormal posture: Secondary | ICD-10-CM

## 2016-11-21 NOTE — Therapy (Signed)
[place], [place], [address] Phone: [phone]   Fax:  [phone]  Physical Therapy Evaluation  Patient Details  Name: Amber [name] MRN: [id number] Date of Birth: [birth year] Referring Provider: Jenne Campus MD  Encounter Date: 11/21/2016      PT End of Session - 11/21/16 1456    Visit Number 1   Number of Visits 12   Date for PT Re-Evaluation 01/02/17   Authorization Type [employer]   PT Start Time 0217   PT Stop Time 0300   PT Time Calculation (min) 43 min   Activity Tolerance Patient tolerated treatment well   Behavior During Therapy [facility] for tasks assessed/performed      Past Medical History:  Diagnosis Date  . Abdominal mass    pt unaware  . Arthritis   . Chest pain    + Tn s/p cath 2012--Normal  . History of tobacco abuse   . HTN (hypertension)   . Hyperlipemia   . IBS (irritable bowel syndrome)   . Obesity   . Osteoporosis   . Persistent atrial fibrillation (HCC)    three types  . Post-menopausal   . Right bundle branch block     Past Surgical History:  Procedure Laterality Date  . BREAST BIOPSY     left breast- benign  . BREAST REDUCTION SURGERY     BILATERAL  . CARDIAC CATHETERIZATION  02-25-2011   EF 60%. Left coronary artery arises and distributes normal, right coronary artery is a dominant vessel and is normal  . CARDIOVASCULAR STRESS TEST  08-27-2002   EF 76%  . CARPAL TUNNEL RELEASE    . ELECTROPHYSIOLOGIC STUDY N/A 03/21/2016   Procedure: Atrial Fibrillation Ablation;  Surgeon: Thompson Grayer, MD;  Location: [place];  Service: Cardiovascular;  Laterality: N/A;  . LOOP RECORDER EXPLANT N/A 12/28/2014   Procedure: LOOP RECORDER EXPLANT;  Surgeon: Deboraha Sprang, MD  . OVARIAN CYST REMOVAL  1978  . TEE WITHOUT CARDIOVERSION N/A 03/20/2016   Procedure: TRANSESOPHAGEAL ECHOCARDIOGRAM (TEE);  Surgeon: Skeet Latch, MD;  Location: [facility];   Service: Cardiovascular;  Laterality: N/A;  . TONSILLECTOMY AND ADENOIDECTOMY  AGE 41  . US ECHOCARDIOGRAPHY  12-23-2006   EF 55-60%    There were no vitals filed for this visit.       Subjective Assessment - 11/21/16 1424    Subjective I had surgery with Dr. Harl Bowie at [facility]  on 10-07-16,  for stenosis,  I have done water aerobics for years and today I began walking in the water.  I did go 3x a week   Pertinent History PLIF L2/3, L4/5 and L5 S1 , BBB, PVC's  A-fib infrequently since Ablation   Limitations Standing;Walking;House hold activities   How long can you sit comfortably?  2 hours    How long can you stand comfortably? 10 min   How long can you walk comfortably? 10 min   Diagnostic tests MRI   Patient Stated Goals I want to be able to walk without a cane, do normal household chores laundry, unload dish washer, going up stairs   Currently in Pain? 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  No   Is the patient reluctant to leave their home because of a fear of  falling?  No     Home Ecologist residence   Living Arrangements Spouse/significant other   Home Access Stairs to enter   Entrance Stairs-Number of Steps 3   Entrance Stairs-Rails Can reach both   Home Layout Two level     Prior Function   Level of Independence Independent     Cognition   Overall Cognitive Status Within Functional Limits for tasks assessed     Observation/Other Assessments   Focus on Therapeutic Outcomes (FOTO)  FOTO intake 29%, 71% limitation predicted 56%     ROM / Strength   AROM / PROM / Strength AROM;Strength     AROM   Overall AROM  Deficits   Lumbar Flexion 30  ERP   Lumbar Extension 5  ERP   Lumbar - Right Side Bend 10  ERP   Lumbar - Left Side Bend 8  ERP   Lumbar - Right Rotation 75%   Lumbar - Left Rotation 60%     Strength   Overall Strength Deficits   Right Hip Flexion 4/5   Right Hip Extension 4/5   Right Hip ABduction 4-/5   Left Hip Flexion 4-/5   Left Hip Extension 4-/5   Left Hip ABduction 3+/5   Right Knee Flexion 4/5   Right Knee Extension 4+/5   Left Knee Flexion 4-/5   Left Knee Extension 4-/5   Right Ankle Dorsiflexion 4/5   Left Ankle Dorsiflexion 4-/5     Flexibility   Hamstrings left 68  right 73     Palpation   Palpation comment well healing scar     Ambulation/Gait   Ambulation/Gait Yes   Ambulation Distance (Feet) 100 Feet   Assistive device 4-wheeled walker   Gait Pattern Step-to pattern   Ambulation Surface Level   Gait velocity 1.47ft/sec                   [facility] Adult PT Treatment/Exercise - 11/21/16 1432      Posture/Postural Control   Posture/Postural Control Postural limitations   Postural Limitations Rounded Shoulders;Forward head;Anterior pelvic tilt     Self-Care   Self-Care Posture   Posture initial sitting and standing posture and given handout on body mechanics to go over next visit                  PT Short Term Goals - 11/21/16  1742      PT SHORT TERM GOAL #1   Title "Demonstrate and verbalize  techniques to reduce the risk of re-injury including: lifting, posture, body mechanics.    Time 6   Period Weeks   Status New     PT SHORT TERM GOAL #2   Title "Pt will be independent with advanced HEP   Time 6   Period Weeks   Status New     PT SHORT TERM GOAL #3   Title "Pt will tolerate walking for 1 hours without increased pain  with LRAD   Time 6   Period Weeks   Status New     PT SHORT TERM GOAL #4   Title Pt will be able to negotiate step without exacerbating  pain in back   Time 6   Period Weeks   Status New     PT SHORT TERM GOAL #5   Title pt will increase lower extremity MMT to 4/5 or greater for increased motor control   Time 6   Period Weeks   Status New     PT SHORT TERM GOAL #6   Title "FOTO will improve from 71% limitation   to 56% limitation     indicating improved functional mobility .    Time 6   Period Weeks   Status New     PT SHORT TERM GOAL #7   Title pt will be able to drive safely and turn head back without exacerbating pain    Time 6   Period Weeks   Status New                  Plan - 12/08/2016 1735    Clinical Impression Statement 70 yo female presents with low complexity evaluation after PLIF for l2/3L4/5 L5/S1 on October 07, 2016 for stenosis.  Pt is 6 weeks post surgery and has deficits in AROM, muscle strength. pain, mobility,gait, posture and body mechanics/ self- care.  Mrs. Cerra has difficulty ascend/descens steps and is dependent on rollator to ambulate and she would like to use cane or nothing.   Pt would benefit from skilled PT to address impairments to return to leisure activities PLOF   Rehab Potential Good   PT Frequency 2x / week   PT Duration 6 weeks   PT Treatment/Interventions ADLs/Self Care Home Management;Cryotherapy;Electrical Stimulation;Iontophoresis 4mg /ml Dexamethasone;Stair training;Gait training;Moist Heat;Ultrasound;Therapeutic  exercise;Neuromuscular re-education;Patient/family education;Passive range of motion;Manual techniques;Taping   PT Next Visit Plan continue with pre pilates core conditioning for back fusion, gait, go over basic body mechanics for back fusion. already given posture body mechanics handout to go over 2nd visit   PT Home Exercise Plan ab set, pelvic tilt.  gave posture body mechanics handout   Consulted and Agree with Plan of Care Patient      Patient will benefit from skilled therapeutic intervention in order to improve the following deficits and impairments:  Difficulty walking, Decreased mobility, Decreased strength, Postural dysfunction, Improper body mechanics, Pain, Hypomobility, Decreased range of motion, Decreased activity tolerance, Decreased knowledge of use of DME  Visit Diagnosis: Midline low back pain without sciatica, unspecified chronicity  Difficulty in walking, not elsewhere classified  Abnormal posture  Muscle weakness (generalized)      G-Codes - 12-08-16 1730    Functional Assessment Tool Used FOTO   Functional Limitation Mobility: Walking and moving around   Mobility: Walking and Moving Around Current Status [id number]) At least 60 percent but less than 80 percent impaired, limited or restricted  71%   Mobility: Walking and Moving Around Goal Status [id number]) At least 40 percent but  less than 60 percent impaired, limited or restricted  56%       Problem List Patient Active Problem List   Diagnosis Date Noted  . A-fib ([place]) 03/21/2016  . Calf pain 06/07/2013  . Atrial fibrillation ([place]) 04/02/2013  . PVC's (premature ventricular contractions) 01/12/2013  . Fibrocystic breast disease 10/30/2011  . Implantable loop recorder-St. Jude 07/09/2011  . Abnormal ECG-Possible ventricular preexcitation versus incomplete right bundle branch block   . Right bundle branch block   . Situational stress 03/13/2011  . HTN (hypertension)   . Chest pain     Voncille Lo,  PT Exercise Expert for the Aging Adult  11/21/16 5:47 PM Phone: [phone] Fax: [facility] [address] [place], [place], [address] Phone: [phone]   Fax:  [phone]  Name: Amber [name] MRN: [id number] Date of Birth: 09-01-1946

## 2016-11-21 NOTE — Patient Instructions (Signed)
Posture Tips DO: - stand tall and erect - keep chin tucked in - keep head and shoulders in alignment - check posture regularly in mirror or large window - pull head back against headrest in car seat;  Change your position often.  Sit with lumbar support. DON'T: - slouch or slump while watching TV or reading - sit, stand or lie in one position  for too long;  Sitting is especially hard on the spine so if you sit at a desk/use the computer, then stand up often!   Copyright  VHI. All rights reserved.  Posture - Standing   Good posture is important. Avoid slouching and forward head thrust. Maintain curve in low back and align ears over shoul- ders, hips over ankles.  Pull your belly button in toward your back bone.  Even weight in both heels.  Ribs lifted up (golden thread from sternum to sky)   Chin down.   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 All rights reserved.  Posture - Sitting   Sit upright, head facing forward. Try using a roll to support lower back. Keep shoulders relaxed, and avoid rounded back. Keep hips level with knees. Avoid crossing legs for long periods.  Sit on sit bones and not tail bone.  Lumbar roll.   Copyright  VHI. All rights reserved.     PELVIC TILT  Lie on back, legs bent. Exhale, tilting top of pelvis back, pubic bone up, to flatten lower back. Inhale, rolling pelvis opposite way, top forward, pubic bone down, arch in back. Repeat __10__ times. Do __2__ sessions per day. Copyright  VHI. All rights reserved.    Isometric Hold With Pelvic Floor (Hook-Lying)  Lie with hips and knees bent. Slowly inhale, and then exhale. Pull navel toward spine and tighten pelvic floor. Hold for __10_ seconds. Continue to breathe in and out during hold. Rest for _10__ seconds. Repeat __10_ times. Do __2-3_ times a day.   Sleeping on Back  Place pillow under knees. A pillow with cervical support and a roll around waist are also helpful. Copyright   VHI. All rights reserved.  Sleeping on Side Place pillow between knees. Use cervical support under neck and a roll around waist as needed. Copyright  VHI. All rights reserved.   Sleeping on Stomach   If this is the only desirable sleeping position, place pillow under lower legs, and under stomach or chest as needed.  Posture - Sitting   Sit upright, head facing forward. Try using a roll to support lower back. Keep shoulders relaxed, and avoid rounded back. Keep hips level with knees. Avoid crossing legs for long periods. Stand to Sit / Sit to Stand   To sit: Bend knees to lower self onto front edge of chair, then scoot back on seat. To stand: Reverse sequence by placing one foot forward, and scoot to front of seat. Use rocking motion to stand up.   Work Height and Reach  Ideal work height is no more than 2 to 4 inches below elbow level when standing, and at elbow level when sitting. Reaching should be limited to arm's length, with elbows slightly bent.  Bending  Bend at hips and knees, not back. Keep feet shoulder-width apart.    Posture - Standing   Good posture is important. Avoid slouching and forward head thrust. Maintain curve in low back and align ears over shoul- ders, hips over ankles.  Alternating Positions   Alternate tasks and change  positions frequently to reduce fatigue and muscle tension. Take rest breaks. Computer Work   Position work to Programmer, multimedia. Use proper work and seat height. Keep shoulders back and down, wrists straight, and elbows at right angles. Use chair that provides full back support. Add footrest and lumbar roll as needed.  Getting Into / Out of Car  Lower self onto seat, scoot back, then bring in one leg at a time. Reverse sequence to get out.  Dressing  Lie on back to pull socks or slacks over feet, or sit and bend leg while keeping back straight.    Housework - Sink  Place one foot on ledge of cabinet under sink when standing at  sink for prolonged periods.   Pushing / Pulling  Pushing is preferable to pulling. Keep back in proper alignment, and use leg muscles to do the work.  Deep Squat   Squat and lift with both arms held against upper trunk. Tighten stomach muscles without holding breath. Use smooth movements to avoid jerking.  Avoid Twisting   Avoid twisting or bending back. Pivot around using foot movements, and bend at knees if needed when reaching for articles.  Carrying Luggage   Distribute weight evenly on both sides. Use a cart whenever possible. Do not twist trunk. Move body as a unit.   Lifting Principles .Maintain proper posture and head alignment. .Slide object as close as possible before lifting. .Move obstacles out of the way. .Test before lifting; ask for help if too heavy. .Tighten stomach muscles without holding breath. .Use smooth movements; do not jerk. .Use legs to do the work, and pivot with feet. .Distribute the work load symmetrically and close to the center of trunk. .Push instead of pull whenever possible.   Ask For Help   Ask for help and delegate to others when possible. Coordinate your movements when lifting together, and maintain the low back curve.  Log Roll   Lying on back, bend left knee and place left arm across chest. Roll all in one movement to the right. Reverse to roll to the left. Always move as one unit. Housework - Sweeping  Use long-handled equipment to avoid stooping.   Housework - Wiping  Position yourself as close as possible to reach work surface. Avoid straining your back.  Laundry - Unloading Wash   To unload small items at bottom of washer, lift leg opposite to arm being used to reach.  [place] close to area to be raked. Use arm movements to do the work. Keep back straight and avoid twisting.     Cart  When reaching into cart with one arm, lift opposite leg to keep back straight.   Getting Into / Out of  Bed  Lower self to lie down on one side by raising legs and lowering head at the same time. Use arms to assist moving without twisting. Bend both knees to roll onto back if desired. To sit up, start from lying on side, and use same move-ments in reverse. Housework - Vacuuming  Hold the vacuum with arm held at side. Step back and forth to move it, keeping head up. Avoid twisting.   Laundry - IT consultant so that bending and twisting can be avoided.   Laundry - Unloading Dryer  Squat down to reach into clothes dryer or use a reacher.  Gardening - Weeding / Probation officer or Kneel. Knee pads may be helpful.  Amber [name], PT Exercise Expert for the Aging Adult  11/21/16 2:56 PM Phone: [phone] Fax: [phone]

## 2016-11-24 ENCOUNTER — Other Ambulatory Visit: Payer: Self-pay | Admitting: Internal Medicine

## 2016-11-25 ENCOUNTER — Ambulatory Visit
Admission: RE | Admit: 2016-11-25 | Discharge: 2016-11-25 | Disposition: A | Discharge status: Home / Self Care | Payer: Medicare Other | Source: Ambulatory Visit | Attending: Internal Medicine | Admitting: Internal Medicine

## 2016-11-25 DIAGNOSIS — Z1231 Encounter for screening mammogram for malignant neoplasm of breast: Secondary | ICD-10-CM | POA: Diagnosis not present

## 2016-11-26 ENCOUNTER — Ambulatory Visit: Payer: Medicare Other | Admitting: Physical Therapy

## 2016-11-26 DIAGNOSIS — R262 Difficulty in walking, not elsewhere classified: Secondary | ICD-10-CM | POA: Diagnosis not present

## 2016-11-26 DIAGNOSIS — M545 Low back pain, unspecified: Secondary | ICD-10-CM

## 2016-11-26 DIAGNOSIS — R293 Abnormal posture: Secondary | ICD-10-CM

## 2016-11-26 DIAGNOSIS — M6281 Muscle weakness (generalized): Secondary | ICD-10-CM | POA: Diagnosis not present

## 2016-11-26 NOTE — Patient Instructions (Signed)
From exercise drawer:  Lumbar stabilization  1 , 2.  Daily 10 -30 X each

## 2016-11-26 NOTE — Therapy (Signed)
[place], [place], [address] Phone: [phone]   Fax:  [phone]  Physical Therapy Treatment  Patient Details  Name: Amber [name] MRN: [id number] Date of Birth: 1946/03/08 Referring Provider: Jenne Campus MD  Encounter Date: 11/26/2016      PT End of Session - 11/26/16 1316    Visit Number 2   Number of Visits 12   Date for PT Re-Evaluation 01/02/17   PT Start Time 0848   PT Stop Time 0935   PT Time Calculation (min) 47 min   Activity Tolerance Patient tolerated treatment well   Behavior During Therapy [facility] for tasks assessed/performed      Past Medical History:  Diagnosis Date  . Abdominal mass    pt unaware  . Arthritis   . Chest pain    + Tn s/p cath 2012--Normal  . History of tobacco abuse   . HTN (hypertension)   . Hyperlipemia   . IBS (irritable bowel syndrome)   . Obesity   . Osteoporosis   . Persistent atrial fibrillation (HCC)    three types  . Post-menopausal   . Right bundle branch block     Past Surgical History:  Procedure Laterality Date  . BREAST BIOPSY     left breast- benign  . BREAST REDUCTION SURGERY     BILATERAL  . CARDIAC CATHETERIZATION  02-25-2011   EF 60%. Left coronary artery arises and distributes normal, right coronary artery is a dominant vessel and is normal  . CARDIOVASCULAR STRESS TEST  08-27-2002   EF 76%  . CARPAL TUNNEL RELEASE    . ELECTROPHYSIOLOGIC STUDY N/A 03/21/2016   Procedure: Atrial Fibrillation Ablation;  Surgeon: Thompson Grayer, MD;  Location: [place] [facility];  Service: Cardiovascular;  Laterality: N/A;  . LOOP RECORDER EXPLANT N/A 12/28/2014   Procedure: LOOP RECORDER EXPLANT;  Surgeon: Deboraha Sprang, MD  . OVARIAN CYST REMOVAL  1978  . TEE WITHOUT CARDIOVERSION N/A 03/20/2016   Procedure: TRANSESOPHAGEAL ECHOCARDIOGRAM (TEE);  Surgeon: Skeet Latch, MD;  Location: [place];  Service: Cardiovascular;  Laterality: N/A;  .  TONSILLECTOMY AND ADENOIDECTOMY  AGE 70  . US ECHOCARDIOGRAPHY  12-23-2006   EF 55-60%    There were no vitals filed for this visit.                       [place] Adult PT Treatment/Exercise - 11/26/16 0001      Self-Care   Self-Care Posture   Posture sitting simulated, supine with legs practiced.     Exercises   Exercises --  suggestions for pool, shoes, walking options in water/land     Lumbar Exercises: Stretches   Pelvic Tilt 5 reps     Lumbar Exercises: Supine   Ab Set 5 reps   Clam Limitations both and single, palpating for control   Other Supine Lumbar Exercises Decompression series     Lumbar Exercises: Sidelying   Clam 10 reps   Clam Limitations both.  Min assist to keep into position,      Knee/Hip Exercises: Standing   Gait Training cane, adjustment suggestions, education. able to do safely in gym.                PT Education - 11/26/16 1316    Education provided Yes   Education Details exercise, gait training   Person(s) Educated Patient   Methods Explanation;Demonstration;Tactile cues;Verbal cues;Handout   Comprehension Verbalized understanding;Returned demonstration  PT Short Term Goals - 11/26/16 1320      PT SHORT TERM GOAL #1   Title "Demonstrate and verbalize techniques to reduce the risk of re-injury including: lifting, posture, body mechanics.    Baseline ed started   Time 6   Period Weeks   Status On-going     PT SHORT TERM GOAL #2   Title "Pt will be independent with advanced HEP   Time 6   Period Weeks   Status On-going     PT SHORT TERM GOAL #3   Title "Pt will tolerate walking for 1 hours without increased pain  with LRAD   Baseline walks a few minutes on land   Time 6   Period Weeks   Status On-going     PT SHORT TERM GOAL #4   Title Pt will be able to negotiate step without exacerbating  pain in back   Time 6   Period Weeks   Status Unable to assess     PT SHORT TERM GOAL #5   Title pt  will increase lower extremity MMT to 4/5 or greater for increased motor control   Time 6   Period Weeks   Status Unable to assess     PT SHORT TERM GOAL #6   Title "FOTO will improve from 71% limitation   to 56% limitation     indicating improved functional mobility .    Time 6   Period Weeks   Status Unable to assess     PT SHORT TERM GOAL #7   Title pt will be able to drive safely and turn head back without exacerbating pain    Time 6   Period Weeks   Status Unable to assess                  Plan - 11/26/16 1317    Clinical Impression Statement Patient able to use cane safely on level surface short distances in the gym.  She wants to try at home.  progress toward HEP goal.  Progress toward ADL goals   PT Next Visit Plan ask about pool with shoes, ask about walking with cane, ask about sleeping position.    PT Home Exercise Plan ab set, pelvic tilt.  gave posture body mechanics handout, clams, abdominal bracing, gait inside with log.   Consulted and Agree with Plan of Care Patient      Patient will benefit from skilled therapeutic intervention in order to improve the following deficits and impairments:  Difficulty walking, Decreased mobility, Decreased strength, Postural dysfunction, Improper body mechanics, Pain, Hypomobility, Decreased range of motion, Decreased activity tolerance, Decreased knowledge of use of DME  Visit Diagnosis: Midline low back pain without sciatica, unspecified chronicity  Difficulty in walking, not elsewhere classified  Abnormal posture  Muscle weakness (generalized)     Problem List Patient Active Problem List   Diagnosis Date Noted  . A-fib ([place]) 03/21/2016  . Calf pain 06/07/2013  . Atrial fibrillation ([place]) 04/02/2013  . PVC's (premature ventricular contractions) 01/12/2013  . Fibrocystic breast disease 10/30/2011  . Implantable loop recorder-St. Jude 07/09/2011  . Abnormal ECG-Possible ventricular preexcitation versus  incomplete right bundle branch block   . Right bundle branch block   . Situational stress 03/13/2011  . HTN (hypertension)   . Chest pain     Britley Gashi PTA 11/26/2016, 1:23 PM  [facility] [address] [place], [place], [address] Phone: [phone]   Fax:  [phone]  Name: MARLYN ARAGONES MRN: [id number] Date of Birth: 05-Mar-1946

## 2016-12-03 ENCOUNTER — Ambulatory Visit: Payer: Medicare Other | Admitting: Physical Therapy

## 2016-12-03 DIAGNOSIS — R293 Abnormal posture: Secondary | ICD-10-CM

## 2016-12-03 DIAGNOSIS — M545 Low back pain, unspecified: Secondary | ICD-10-CM

## 2016-12-03 DIAGNOSIS — M6281 Muscle weakness (generalized): Secondary | ICD-10-CM

## 2016-12-03 DIAGNOSIS — R262 Difficulty in walking, not elsewhere classified: Secondary | ICD-10-CM | POA: Diagnosis not present

## 2016-12-03 NOTE — Patient Instructions (Signed)
Piriformis (Supine)  Cross legs, right on top. Gently pull other knee toward chest until stretch is felt in buttock/hip of top leg. Hold _30- 60 ___ seconds. Repeat __2-3__ times per set. Do _1___ sets per session. Do _1-2___ sessions per day.  Hip Stretch  Put right ankle over left knee. Let right knee fall downward, but keep ankle in place. Feel the stretch in hip. May push down gently with hand to feel stretch. Hold _30-60___ seconds while counting out loud. Repeat with other leg. Repeat 2-3____ times. Do ____ sessions per day.  Amber [name], PT Exercise Expert for the Aging Adult  12/03/16 9:41 AM Phone: [phone] Fax: [phone]

## 2016-12-03 NOTE — Therapy (Signed)
[place], [place], [address] Phone: [phone]   Fax:  [phone]  Physical Therapy Treatment  Patient Details  Name: KISSY FELLER MRN: [id number] Date of Birth: February 02, 1946 Referring Provider: Jenne Campus MD  Encounter Date: 12/03/2016      PT End of Session - 12/03/16 1019    Visit Number 3   Number of Visits 12   Date for PT Re-Evaluation 01/02/17   Authorization Type [employer]   PT Start Time 0932   PT Stop Time 1019   PT Time Calculation (min) 47 min   Activity Tolerance Patient tolerated treatment well   Behavior During Therapy [facility] for tasks assessed/performed      Past Medical History:  Diagnosis Date  . Abdominal mass    pt unaware  . Arthritis   . Chest pain    + Tn s/p cath 2012--Normal  . History of tobacco abuse   . HTN (hypertension)   . Hyperlipemia   . IBS (irritable bowel syndrome)   . Obesity   . Osteoporosis   . Persistent atrial fibrillation (HCC)    three types  . Post-menopausal   . Right bundle branch block     Past Surgical History:  Procedure Laterality Date  . BREAST BIOPSY     left breast- benign  . BREAST REDUCTION SURGERY     BILATERAL  . CARDIAC CATHETERIZATION  02-25-2011   EF 60%. Left coronary artery arises and distributes normal, right coronary artery is a dominant vessel and is normal  . CARDIOVASCULAR STRESS TEST  08-27-2002   EF 76%  . CARPAL TUNNEL RELEASE    . ELECTROPHYSIOLOGIC STUDY N/A 03/21/2016   Procedure: Atrial Fibrillation Ablation;  Surgeon: Thompson Grayer, MD;  Location: [place] [facility];  Service: Cardiovascular;  Laterality: N/A;  . LOOP RECORDER EXPLANT N/A 12/28/2014   Procedure: LOOP RECORDER EXPLANT;  Surgeon: Deboraha Sprang, MD  . OVARIAN CYST REMOVAL  1978  . TEE WITHOUT CARDIOVERSION N/A 03/20/2016   Procedure: TRANSESOPHAGEAL ECHOCARDIOGRAM (TEE);  Surgeon: Skeet Latch, MD;  Location: [place];   Service: Cardiovascular;  Laterality: N/A;  . TONSILLECTOMY AND ADENOIDECTOMY  AGE 70  . US ECHOCARDIOGRAPHY  12-23-2006   EF 55-60%    There were no vitals filed for this visit.      Subjective Assessment - 12/03/16 0936    Subjective I am using Bill's cane ( too tall) for me today.  I think I am doing better.  No twisting.  I went to the pool yesterday for therapy. I didnt use pool shoes.  Today is my first day in shoes. I can put my left ankle over my knee but I cannot on my right foot   Pertinent History PLIF L2/3, L4/5 and L5 S1 , BBB, PVC's  A-fib infrequently since Ablation   Limitations Standing;Walking;House hold activities   Diagnostic tests MRI   Patient Stated Goals I want to be able to walk without a cane, do normal household chores laundry, unload dish washer, going up stairs   Currently in Pain? Yes   Pain Score 3    Pain Location Back   Pain Orientation Mid;Lower   Pain Descriptors / Indicators Aching;Sore   Pain Type Surgical pain   Pain Onset More than a month ago   Pain Frequency Intermittent  [place] Adult PT Treatment/Exercise - 12/03/16 0944      Ambulation/Gait   Ambulation/Gait Yes   Assistive device Straight cane   Gait Pattern Step-through pattern   Stairs Yes   Stairs Assistance 6: Modified independent (Device/Increase time)  one step at a time   Stair Management Technique One rail Right;With cane   Number of Stairs 4  up and down   Height of Stairs 6   Gait Comments Pt has cane not right size for her and damaged but will get appropriate cane today for her needs.     Self-Care   Self-Care Other Self-Care Comments   Heat/Ice Application use of ice pack at night on right hip to decrease pain before sleeping to decrease nerve conduction velocity in order to get to sleep.   Other Self-Care Comments  sue of water shoes in pool and given piriformis stretch to make shoe weating/fitting easier, ,  Sleeping management,   shifiting weight at night on hips to decrease right hip pain, use of tennis ball for myofascial release     Lumbar Exercises: Supine   Ab Set 5 reps   AB Set Limitations 10 seconds   Clam 10 reps  single with VC for breathing bil   Bent Knee Raise 10 reps;3 seconds   Bent Knee Raise Limitations bil with VC breathing   Other Supine Lumbar Exercises pelvic tilt x 10 wit Vc     Knee/Hip Exercises: Stretches   Piriformis Stretch 5 reps  3 reps 30 sec on right supine, sitting x 2 30 sec   Piriformis Stretch Limitations Pt with increased pain on stretch with minimal release     Manual Therapy   Manual Therapy Soft tissue mobilization   Soft tissue mobilization tenderness over right piriformis soft tissue                PT Education - 12/03/16 0952    Education provided Yes   Education Details piriformis stretch in supine and sitting and use of towel to assist stretch. gait training on stairs and sleep management , discussed pool shoes.   Person(s) Educated Patient   Methods Explanation;Demonstration;Verbal cues;Handout   Comprehension Verbalized understanding;Returned demonstration          PT Short Term Goals - 11/26/16 1320      PT SHORT TERM GOAL #1   Title "Demonstrate and verbalize techniques to reduce the risk of re-injury including: lifting, posture, body mechanics.    Baseline ed started   Time 6   Period Weeks   Status On-going     PT SHORT TERM GOAL #2   Title "Pt will be independent with advanced HEP   Time 6   Period Weeks   Status On-going     PT SHORT TERM GOAL #3   Title "Pt will tolerate walking for 1 hours without increased pain  with LRAD   Baseline walks a few minutes on land   Time 6   Period Weeks   Status On-going     PT SHORT TERM GOAL #4   Title Pt will be able to negotiate step without exacerbating  pain in back   Time 6   Period Weeks   Status Unable to assess     PT SHORT TERM GOAL #5   Title pt will increase lower extremity  MMT to 4/5 or greater for increased motor control   Time 6   Period Weeks   Status Unable to assess  PT SHORT TERM GOAL #6   Title "FOTO will improve from 71% limitation   to 56% limitation     indicating improved functional mobility .    Time 6   Period Weeks   Status Unable to assess     PT SHORT TERM GOAL #7   Title pt will be able to drive safely and turn head back without exacerbating pain    Time 6   Period Weeks   Status Unable to assess                  Plan - 12/03/16 1005    Clinical Impression Statement Pt is able to use cane safely in left hand since right leg weaker than left for short distances < 100 feet.  Pt able to use steps x 4 up and down with hand rail safely.  Mrs. Yusko will get new cane to be adjusted next visit.  Discussed sleeping and pain management using cold pack at night and pillows and need for swimming shoes for pool therapy.  Pt responded well to piriformis stretch and manual therapy   Rehab Potential Good   PT Frequency 2x / week   PT Duration 6 weeks   PT Treatment/Interventions ADLs/Self Care Home Management;Cryotherapy;Electrical Stimulation;Iontophoresis 4mg /ml Dexamethasone;Stair training;Gait training;Moist Heat;Ultrasound;Therapeutic exercise;Neuromuscular re-education;Patient/family education;Passive range of motion;Manual techniques;Taping   PT Next Visit Plan adust cane.  given HEP for pre pilates and review and review piriformis stretch   PT Home Exercise Plan ab set, pelvic tilt.  gave posture body mechanics handout, clams, abdominal bracing, gait inside with log.   Consulted and Agree with Plan of Care Patient      Patient will benefit from skilled therapeutic intervention in order to improve the following deficits and impairments:  Difficulty walking, Decreased mobility, Decreased strength, Postural dysfunction, Improper body mechanics, Pain, Hypomobility, Decreased range of motion, Decreased activity tolerance, Decreased  knowledge of use of DME  Visit Diagnosis: Midline low back pain without sciatica, unspecified chronicity  Difficulty in walking, not elsewhere classified  Abnormal posture  Muscle weakness (generalized)     Problem List Patient Active Problem List   Diagnosis Date Noted  . A-fib ([place]) 03/21/2016  . Calf pain 06/07/2013  . Atrial fibrillation ([place]) 04/02/2013  . PVC's (premature ventricular contractions) 01/12/2013  . Fibrocystic breast disease 10/30/2011  . Implantable loop recorder-St. Jude 07/09/2011  . Abnormal ECG-Possible ventricular preexcitation versus incomplete right bundle branch block   . Right bundle branch block   . Situational stress 03/13/2011  . HTN (hypertension)   . Chest pain     Voncille Lo, PT Exercise Expert for the Aging Adult  12/03/16 10:20 AM Phone: [phone] Fax: [facility] [address] [place], [place], [address] Phone: [phone]   Fax:  [phone]  Name: BLANCHIE CROWE MRN: [id number] Date of Birth: 1946-09-02

## 2016-12-04 ENCOUNTER — Ambulatory Visit: Payer: Medicare Other | Admitting: Physical Therapy

## 2016-12-04 DIAGNOSIS — R293 Abnormal posture: Secondary | ICD-10-CM

## 2016-12-04 DIAGNOSIS — M545 Low back pain, unspecified: Secondary | ICD-10-CM

## 2016-12-04 DIAGNOSIS — R262 Difficulty in walking, not elsewhere classified: Secondary | ICD-10-CM

## 2016-12-04 DIAGNOSIS — M6281 Muscle weakness (generalized): Secondary | ICD-10-CM | POA: Diagnosis not present

## 2016-12-04 NOTE — Therapy (Signed)
[place], [place], [address] Phone: [phone]   Fax:  [phone]  Physical Therapy Treatment  Patient Details  Name: Amber [name] MRN: [id number] Date of Birth: Sep 29, 1946 Referring Provider: Jenne Campus MD  Encounter Date: 12/04/2016      PT End of Session - 12/04/16 1707    Visit Number 4   Number of Visits 12   Date for PT Re-Evaluation 01/02/17   PT Start Time 1504   PT Stop Time 1600   PT Time Calculation (min) 56 min   Activity Tolerance Patient tolerated treatment well   Behavior During Therapy [facility] for tasks assessed/performed      Past Medical History:  Diagnosis Date  . Abdominal mass    pt unaware  . Arthritis   . Chest pain    + Tn s/p cath 2012--Normal  . History of tobacco abuse   . HTN (hypertension)   . Hyperlipemia   . IBS (irritable bowel syndrome)   . Obesity   . Osteoporosis   . Persistent atrial fibrillation (HCC)    three types  . Post-menopausal   . Right bundle branch block     Past Surgical History:  Procedure Laterality Date  . BREAST BIOPSY     left breast- benign  . BREAST REDUCTION SURGERY     BILATERAL  . CARDIAC CATHETERIZATION  02-25-2011   EF 60%. Left coronary artery arises and distributes normal, right coronary artery is a dominant vessel and is normal  . CARDIOVASCULAR STRESS TEST  08-27-2002   EF 76%  . CARPAL TUNNEL RELEASE    . ELECTROPHYSIOLOGIC STUDY N/A 03/21/2016   Procedure: Atrial Fibrillation Ablation;  Surgeon: Thompson Grayer, MD;  Location: [place] [facility];  Service: Cardiovascular;  Laterality: N/A;  . LOOP RECORDER EXPLANT N/A 12/28/2014   Procedure: LOOP RECORDER EXPLANT;  Surgeon: Deboraha Sprang, MD  . OVARIAN CYST REMOVAL  1978  . TEE WITHOUT CARDIOVERSION N/A 03/20/2016   Procedure: TRANSESOPHAGEAL ECHOCARDIOGRAM (TEE);  Surgeon: Skeet Latch, MD;  Location: [place];  Service: Cardiovascular;  Laterality: N/A;  .  TONSILLECTOMY AND ADENOIDECTOMY  AGE 19  . US ECHOCARDIOGRAPHY  12-23-2006   EF 55-60%    There were no vitals filed for this visit.      Subjective Assessment - 12/04/16 1513    Subjective 3-4/10  today   Currently in Pain? Yes   Pain Score 3    Pain Location Back   Pain Orientation Mid;Lower                         [facility] Adult PT Treatment/Exercise - 12/04/16 0001      Self-Care   Other Self-Care Comments  suggested abdominal support with pillows to decrease pain     Lumbar Exercises: Stretches   Passive Hamstring Stretch 3 reps;30 seconds  right X 3, LT X 3 by PTA   Pelvic Tilt 5 reps   Piriformis Stretch 3 reps;30 seconds  3 X right, 2 X left     Lumbar Exercises: Supine   Ab Set 5 reps   AB Set Limitations 5 seconds   Clam 10 reps  cued for abdominals, breathing   Bent Knee Raise 10 reps;3 seconds   Bent Knee Raise Limitations both,  some rocking noted     Knee/Hip Exercises: Stretches   Sports administrator 3 reps;30 seconds   Quad Stretch Limitations right only with quad soft  tissue strumming to soften tissue     Moist Heat Therapy   Number Minutes Moist Heat 10 Minutes   Moist Heat Location Hip  gluteal     Manual Therapy   Manual Therapy Soft tissue mobilization   Soft tissue mobilization Glut minimus tight,  Piriformis less tight today.                  PT Education - 12/04/16 1706    Education provided Yes   Education Details positioning in bed with abdominal pillows( already tried pillows between knees)   Person(s) Educated Patient   Methods Explanation   Comprehension Verbalized understanding          PT Short Term Goals - 12/04/16 1710      PT SHORT TERM GOAL #1   Title "Demonstrate and verbalize techniques to reduce the risk of re-injury including: lifting, posture, body mechanics.    Time 6   Period Weeks   Status On-going     PT SHORT TERM GOAL #2   Title "Pt will be independent with advanced HEP   Time 6    Period Weeks   Status On-going     PT SHORT TERM GOAL #3   Title "Pt will tolerate walking for 1 hours without increased pain  with LRAD   Baseline is trying to walk more on land   Time 6   Period Weeks   Status On-going     PT SHORT TERM GOAL #4   Title Pt will be able to negotiate step without exacerbating  pain in back   Time 6   Period Weeks   Status Unable to assess     PT SHORT TERM GOAL #5   Title pt will increase lower extremity MMT to 4/5 or greater for increased motor control   Time 6   Period Weeks   Status Unable to assess     PT SHORT TERM GOAL #6   Title "FOTO will improve from 71% limitation   to 56% limitation     indicating improved functional mobility .    Time 6   Period Weeks   Status Unable to assess     PT SHORT TERM GOAL #7   Title pt will be able to drive safely and turn head back without exacerbating pain    Time 6   Period Weeks   Status Unable to assess                  Plan - 12/04/16 1707    Clinical Impression Statement Piriformis non tight as noted with manual today.  Patient continues to have pain at night.  Pillows suggested for abdominal area in addition to between knees.  Patient feels safer using cane in her right hand. .  No new golas met.  Stabilization and stretching continued.   PT Next Visit Plan .  given HEP for pre pilates and review and review piriformis stretch   PT Home Exercise Plan ab set, pelvic tilt.  gave posture body mechanics handout, clams, abdominal bracing, gait inside with log.   Consulted and Agree with Plan of Care Patient      Patient will benefit from skilled therapeutic intervention in order to improve the following deficits and impairments:  Difficulty walking, Decreased mobility, Decreased strength, Postural dysfunction, Improper body mechanics, Pain, Hypomobility, Decreased range of motion, Decreased activity tolerance, Decreased knowledge of use of DME  Visit Diagnosis: Midline low back pain  without sciatica, unspecified  chronicity  Difficulty in walking, not elsewhere classified  Abnormal posture     Problem List Patient Active Problem List   Diagnosis Date Noted  . A-fib ([place]) 03/21/2016  . Calf pain 06/07/2013  . Atrial fibrillation ([place]) 04/02/2013  . PVC's (premature ventricular contractions) 01/12/2013  . Fibrocystic breast disease 10/30/2011  . Implantable loop recorder-St. Jude 07/09/2011  . Abnormal ECG-Possible ventricular preexcitation versus incomplete right bundle branch block   . Right bundle branch block   . Situational stress 03/13/2011  . HTN (hypertension)   . Chest pain     HARRIS,KAREN PTA 12/04/2016, 5:13 PM  [facility] [address] [place], [place], [address] Phone: [phone]   Fax:  [phone]  Name: Amber [name] MRN: [id number] Date of Birth: 05/14/1946

## 2016-12-12 ENCOUNTER — Ambulatory Visit: Payer: Medicare Other | Admitting: Physical Therapy

## 2016-12-12 DIAGNOSIS — R262 Difficulty in walking, not elsewhere classified: Secondary | ICD-10-CM | POA: Diagnosis not present

## 2016-12-12 DIAGNOSIS — M545 Low back pain, unspecified: Secondary | ICD-10-CM

## 2016-12-12 DIAGNOSIS — R293 Abnormal posture: Secondary | ICD-10-CM

## 2016-12-12 DIAGNOSIS — M6281 Muscle weakness (generalized): Secondary | ICD-10-CM | POA: Diagnosis not present

## 2016-12-12 NOTE — Patient Instructions (Signed)
IN order to improve stepping on steps, you must use steps!!   Use handrail and slowly do up and down with good motor control    Step-Up: Lateral   Step up to side with right leg. Bring other foot up onto __6__ inch step. Return to floor position with left leg. Repeat _10-15___ times per session. Do __2__ sessions per day. Repeat in dimly lit room. Repeat with eyes closed.  Copyright  VHI. All rights reserved.  Forward   Facing step, place one leg on step, flexed at hip. Step up slowly, bringing hips in line with knee and shoulder. Bring other foot onto step. Reverse process to step back down. Repeat with other leg. Do _10-15___ repetitions, __2__ sets.  http://bt.exer.us/154   Copyright  VHI. All rights reserved.   Step Down: Anterior   From 4-6" step, reach one leg forward as far as possible, still able to return easily. Touch toe and heel to floor. Return to single leg balance. Repeat with other leg. Repeat __10-15__ times. Do __2__ sessions per day. Do not hold dumbbells.  Hold onto railing for safety.  Voncille Lo, PT Exercise Expert for the Aging Adult  12/12/16 1:54 PM Phone: [phone] Fax: [phone]
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
## 2016-12-17 ENCOUNTER — Ambulatory Visit: Payer: Medicare Other | Attending: Internal Medicine | Admitting: Physical Therapy

## 2016-12-17 DIAGNOSIS — M545 Low back pain, unspecified: Secondary | ICD-10-CM

## 2016-12-17 DIAGNOSIS — R293 Abnormal posture: Secondary | ICD-10-CM | POA: Diagnosis not present

## 2016-12-17 DIAGNOSIS — R262 Difficulty in walking, not elsewhere classified: Secondary | ICD-10-CM

## 2016-12-17 DIAGNOSIS — M6281 Muscle weakness (generalized): Secondary | ICD-10-CM | POA: Insufficient documentation

## 2016-12-17 NOTE — Therapy (Signed)
[place], [place], [address] Phone: [phone]   Fax:  [phone]  Physical Therapy Treatment  Patient Details  Name: Amber [name] MRN: [id number] Date of Birth: [birth year] Referring Provider: Jenne Campus MD  Encounter Date: 12/17/2016      PT End of Session - 12/17/16 0754    Visit Number 6   Number of Visits 12   Date for PT Re-Evaluation 01/02/17   Authorization Type [employer]   PT Start Time [phone]   PT Stop Time [phone]   PT Time Calculation (min) 59 min   Activity Tolerance Patient tolerated treatment well   Behavior During Therapy [facility] for tasks assessed/performed      Past Medical History:  Diagnosis Date  . Abdominal mass    pt unaware  . Arthritis   . Chest pain    + Tn s/p cath 2012--Normal  . History of tobacco abuse   . HTN (hypertension)   . Hyperlipemia   . IBS (irritable bowel syndrome)   . Obesity   . Osteoporosis   . Persistent atrial fibrillation (HCC)    three types  . Post-menopausal   . Right bundle branch block     Past Surgical History:  Procedure Laterality Date  . BREAST BIOPSY     left breast- benign  . BREAST REDUCTION SURGERY     BILATERAL  . CARDIAC CATHETERIZATION  02-25-2011   EF 60%. Left coronary artery arises and distributes normal, right coronary artery is a dominant vessel and is normal  . CARDIOVASCULAR STRESS TEST  08-27-2002   EF 76%  . CARPAL TUNNEL RELEASE    . ELECTROPHYSIOLOGIC STUDY N/A 03/21/2016   Procedure: Atrial Fibrillation Ablation;  Surgeon: Thompson Grayer, MD;  Location: [facility];  Service: Cardiovascular;  Laterality: N/A;  . LOOP RECORDER EXPLANT N/A 12/28/2014   Procedure: LOOP RECORDER EXPLANT;  Surgeon: Deboraha Sprang, MD  . OVARIAN CYST REMOVAL  1978  . TEE WITHOUT CARDIOVERSION N/A 03/20/2016   Procedure: TRANSESOPHAGEAL ECHOCARDIOGRAM (TEE);  Surgeon: Skeet Latch, MD;  Location: [facility];   Service: Cardiovascular;  Laterality: N/A;  . TONSILLECTOMY AND ADENOIDECTOMY  AGE 19  . US ECHOCARDIOGRAPHY  12-23-2006   EF 55-60%    There were no vitals filed for this visit.      Subjective Assessment - 12/17/16 0758    Subjective I had an ablation for A-fib in April/May 2017.  I am going for an appt next week, but I am calling .  I can't do anything taxing today until I call.  i can walk around the house without a cane except early morning   Pertinent History PLIF L2/3, L4/5 and L5 S1 , BBB, PVC's  A-fib infrequently since Ablation   Limitations Standing;Walking;House hold activities   How long can you sit comfortably?  2 hours    Patient Stated Goals I want to be able to walk without a cane, do normal household chores laundry, unload dish washer, going up stairs   Currently in Pain? Yes   Pain Score 2    Pain Location Back   Pain Orientation Mid;Lower;Right   Pain Descriptors / Indicators Aching   Pain Type Surgical pain   Pain Onset More than a month ago   Pain Frequency Intermittent                         [facility] Adult PT Treatment/Exercise -  12/17/16 0815      Lumbar Exercises: Stretches   Passive Hamstring Stretch 2 reps;30 seconds   Passive Hamstring Stretch Limitations bil with strap   Pelvic Tilt --   Pelvic Tilt Limitations 10 reps   Piriformis Stretch 2 reps;30 seconds  right   Piriformis Stretch Limitations utilizing strap for pull     Lumbar Exercises: Supine   Ab Set 5 reps   AB Set Limitations 10 sec   Clam 10 reps  cued for abdominals, breathing   Bent Knee Raise 10 reps;3 seconds   Bridge 10 reps   Bridge Limitations VC for abdominal bracing   Other Supine Lumbar Exercises pelvic tilt x 10     Lumbar Exercises: Sidelying   Clam 15 reps   Clam Limitations bil with red t band     Knee/Hip Exercises: Standing   Lateral Step Up Limitations 5 x on Right holding onto chair rail on stairs   Forward Step Up Limitations 5 x  on right  holding onto chair rail   Step Down Limitations 5 x 1 1/2 way down step.     Moist Heat Therapy   Number Minutes Moist Heat 10 Minutes   Moist Heat Location Hip  Right QL/ lumbosacral     Iontophoresis   Type of Iontophoresis Dexamethasone   Location Right lumbosacral   Dose 1cc patch 4-6 hours   Time 6 hours max and then remove     Manual Therapy   Manual Therapy Soft tissue mobilization   Soft tissue mobilization Right QL and buttocks with IASTYM tool                PT Education - 12/17/16 0815    Education provided Yes   Education Details added bridge and clams with red t band to HEP and education on iontophoresis   Person(s) Educated Patient   Methods Explanation;Demonstration;Verbal cues;Handout   Comprehension Verbalized understanding;Returned demonstration          PT Short Term Goals - 12/17/16 0817      PT SHORT TERM GOAL #1   Title "Demonstrate and verbalize techniques to reduce the risk of re-injury including: lifting, posture, body mechanics.    Baseline Pt is able to verbalize 3 strategies for better body mechancis   Time 6   Period Weeks   Status Achieved     PT SHORT TERM GOAL #2   Title "Pt will be independent with advanced HEP   Time 6   Period Weeks   Status On-going     PT SHORT TERM GOAL #3   Title "Pt will tolerate walking for 1 hours without increased pain  with LRAD   Baseline is trying to walk more on land   Time 6   Period Weeks   Status On-going     PT SHORT TERM GOAL #4   Title Pt will be able to negotiate step without exacerbating  pain in back   Baseline pt 3-4 /10   Time 6   Period Weeks   Status On-going     PT SHORT TERM GOAL #5   Title pt will increase lower extremity MMT to 4/5 or greater for increased motor control   Time 6   Period Weeks   Status On-going     PT SHORT TERM GOAL #6   Title "FOTO will improve from 71% limitation   to 56% limitation     indicating improved functional mobility .    Time 6  Period Weeks   Status On-going     PT SHORT TERM GOAL #7   Title pt will be able to drive safely and turn head back without exacerbating pain    Baseline depending on back up camera   Time 6   Period Weeks   Status Achieved                  Plan - 12/17/16 [id number]    Clinical Impression Statement Pt presents with less dependence on straight cane and reports that she does not use cane in the home.  She is able to back up from driving without exacerbating pain and achieved 1st and 7th goal today.  Pt right lumbosacral area is less tender than before after last treatment.  Pt Certification is signed and can now try iontophoresis patch for localized pain. irritation.  Pt  agrees that core stabilization exerciises are making a difference in her pain. and will continue utilizing at home.   Rehab Potential Good   PT Frequency 2x / week   PT Duration 6 weeks   PT Treatment/Interventions ADLs/Self Care Home Management;Cryotherapy;Electrical Stimulation;Iontophoresis 4mg /ml Dexamethasone;Stair training;Gait training;Moist Heat;Ultrasound;Therapeutic exercise;Neuromuscular re-education;Patient/family education;Passive range of motion;Manual techniques;Taping   PT Next Visit Plan  try quadriped exercise. sidelying clam etc as pt is able to tolerate for core strengthening/ assess Iontophoresis benefit   PT Home Exercise Plan ab set, pelvic tilt.  gave posture body mechanics handout, clams, abdominal bracing, gait inside with log. added knee step ups forward, down step and lateral sidelying clams with red tband and bridging   Consulted and Agree with Plan of Care Patient      Patient will benefit from skilled therapeutic intervention in order to improve the following deficits and impairments:  Difficulty walking, Decreased mobility, Decreased strength, Postural dysfunction, Improper body mechanics, Pain, Hypomobility, Decreased range of motion, Decreased activity tolerance, Decreased knowledge of  use of DME  Visit Diagnosis: Midline low back pain without sciatica, unspecified chronicity  Difficulty in walking, not elsewhere classified  Abnormal posture  Muscle weakness (generalized)     Problem List Patient Active Problem List   Diagnosis Date Noted  . A-fib ([place]) 03/21/2016  . Calf pain 06/07/2013  . Atrial fibrillation ([place]) 04/02/2013  . PVC's (premature ventricular contractions) 01/12/2013  . Fibrocystic breast disease 10/30/2011  . Implantable loop recorder-St. Jude 07/09/2011  . Abnormal ECG-Possible ventricular preexcitation versus incomplete right bundle branch block   . Right bundle branch block   . Situational stress 03/13/2011  . HTN (hypertension)   . Chest pain     Voncille Lo, PT Exercise Expert for the Aging Adult  12/17/16 8:46 AM Phone: [phone] Fax: [facility] [address] [place], [place], [address] Phone: [phone]   Fax:  [phone]  Name: KAYLIE MARULANDA MRN: [id number] Date of Birth: Oct 02, 1946

## 2016-12-17 NOTE — Patient Instructions (Addendum)
    HIP: Abduction / External Rotation (Band)   Place band around knees. Lie on side with hips and knees bent. Raise top knee up, squeezing glutes. Keep feet together. Hold _3-5__ seconds. Use __red_____ band. _10-15__ reps per set, _1-2__ sets per day, __5-7_ days per week  Bridge   Lie back, legs bent. Inhale, pressing hips up. Keeping ribs in, lengthen lower back. Exhale, rolling down along spine from top. Repeat _10-15___ times. Do 1-2____ sessions per day.  IONTOPHORESIS PATIENT PRECAUTIONS & CONTRAINDICATIONS:  . Redness under one or both electrodes can occur.  This characterized by a uniform redness that usually disappears within 12 hours of treatment. . Small pinhead size blisters may result in response to the drug.  Contact your physician if the problem persists more than 24 hours. . On rare occasions, iontophoresis therapy can result in temporary skin reactions such as rash, inflammation, irritation or burns.  The skin reactions may be the result of individual sensitivity to the ionic solution used, the condition of the skin at the start of treatment, reaction to the materials in the electrodes, allergies or sensitivity to dexamethasone, or a poor connection between the patch and your skin.  Discontinue using iontophoresis if you have any of these reactions and report to your therapist. . Remove the Patch or electrodes if you have any undue sensation of pain or burning during the treatment and report discomfort to your therapist. . Tell your Therapist if you have had known adverse reactions to the application of electrical current. . If using the Patch, the LED light will turn off when treatment is complete and the patch can be removed.  Approximate treatment time is 1-3 hours.  Remove the patch when light goes off or after 6 hours. . The Patch can be worn during normal activity, however excessive motion where the electrodes have been placed can cause poor contact between the skin  and the electrode or uneven electrical current resulting in greater risk of skin irritation. Marland Kitchen Keep out of the reach of children.   . DO NOT use if you have a cardiac pacemaker or any other electrically sensitive implanted device. . DO NOT use if you have a known sensitivity to dexamethasone. . DO NOT use during Magnetic Resonance Imaging (MRI). . DO NOT use over broken or compromised skin (e.g. sunburn, cuts, or acne) due to the increased risk of skin reaction. . DO NOT SHAVE over the area to be treated:  To establish good contact between the Patch and the skin, excessive hair may be clipped. . DO NOT place the Patch or electrodes on or over your eyes, directly over your heart, or brain. . DO NOT reuse the Patch or electrodes as this may cause burns to occur.    Voncille Lo, PT Exercise Expert for the Aging Adult  12/17/16 8:15 AM Phone: [phone] Fax: [phone]

## 2016-12-19 ENCOUNTER — Ambulatory Visit: Payer: Medicare Other | Admitting: Physical Therapy

## 2016-12-19 DIAGNOSIS — M6281 Muscle weakness (generalized): Secondary | ICD-10-CM | POA: Diagnosis not present

## 2016-12-19 DIAGNOSIS — M545 Low back pain, unspecified: Secondary | ICD-10-CM

## 2016-12-19 DIAGNOSIS — R293 Abnormal posture: Secondary | ICD-10-CM | POA: Diagnosis not present

## 2016-12-19 DIAGNOSIS — R262 Difficulty in walking, not elsewhere classified: Secondary | ICD-10-CM | POA: Diagnosis not present

## 2016-12-19 NOTE — Patient Instructions (Signed)
Patch cannot be re- used. Talk with MD or PA at [facility] for exercise advice with atrial Fib.  I advised her against using the treadmill until cleared by the MD. She agreed.

## 2016-12-19 NOTE — Therapy (Signed)
[place], [place], [address] Phone: [phone]   Fax:  [phone]  Physical Therapy Treatment  Patient Details  Name: Amber [name] MRN: [id number] Date of Birth: August 01, 1946 Referring Provider: Jenne Campus MD  Encounter Date: 12/19/2016      PT End of Session - 12/19/16 1642    Visit Number 7   Number of Visits 12   PT Start Time 1419   PT Stop Time 1510   PT Time Calculation (min) 51 min   Activity Tolerance Patient tolerated treatment well   Behavior During Therapy [facility] for tasks assessed/performed      Past Medical History:  Diagnosis Date  . Abdominal mass    pt unaware  . Arthritis   . Chest pain    + Tn s/p cath 2012--Normal  . History of tobacco abuse   . HTN (hypertension)   . Hyperlipemia   . IBS (irritable bowel syndrome)   . Obesity   . Osteoporosis   . Persistent atrial fibrillation (HCC)    three types  . Post-menopausal   . Right bundle branch block     Past Surgical History:  Procedure Laterality Date  . BREAST BIOPSY     left breast- benign  . BREAST REDUCTION SURGERY     BILATERAL  . CARDIAC CATHETERIZATION  02-25-2011   EF 60%. Left coronary artery arises and distributes normal, right coronary artery is a dominant vessel and is normal  . CARDIOVASCULAR STRESS TEST  08-27-2002   EF 76%  . CARPAL TUNNEL RELEASE    . ELECTROPHYSIOLOGIC STUDY N/A 03/21/2016   Procedure: Atrial Fibrillation Ablation;  Surgeon: Thompson Grayer, MD;  Location: [facility];  Service: Cardiovascular;  Laterality: N/A;  . LOOP RECORDER EXPLANT N/A 12/28/2014   Procedure: LOOP RECORDER EXPLANT;  Surgeon: Deboraha Sprang, MD  . OVARIAN CYST REMOVAL  1978  . TEE WITHOUT CARDIOVERSION N/A 03/20/2016   Procedure: TRANSESOPHAGEAL ECHOCARDIOGRAM (TEE);  Surgeon: Skeet Latch, MD;  Location: [facility];  Service: Cardiovascular;  Laterality: N/A;  . TONSILLECTOMY AND ADENOIDECTOMY  AGE 51   . US ECHOCARDIOGRAPHY  12-23-2006   EF 55-60%    There were no vitals filed for this visit.      Subjective Assessment - 12/19/16 1633    Subjective I am in atrial Fib.  I walked 20 minutes today and I walked 15 minutes yesterday.  I hav  avoided the water exercis because it is so cold and I wear my wet suit home.  i can now get dressed myself and can tie my shoes,.    Currently in Pain? Yes   Pain Score 5    Pain Location Back   Pain Orientation Right;Lower   Pain Descriptors / Indicators Aching  pressure   Pain Frequency Intermittent   Aggravating Factors  walking longer   Pain Relieving Factors medication, rest, tylenol the patch helped a little.                          [facility] Adult PT Treatment/Exercise - 12/19/16 0001      Moist Heat Therapy   Number Minutes Moist Heat 10 Minutes   Moist Heat Location Hip;Lumbar Spine     Ultrasound   Ultrasound Location Right SI   Ultrasound Parameters 100 % 1.0 watts/cm2   Ultrasound Goals Pain     Iontophoresis   Type of Iontophoresis Dexamethasone   Location RT  SI area   Dose 1cc 4-6 hour patch   Time 8     Manual Therapy   Manual Therapy Soft tissue mobilization   Soft tissue mobilization Right QL and buttocks   tissue continues to be stiff over SI area.  other tissue sof                  PT Short Term Goals - 12/17/16 0817      PT SHORT TERM GOAL #1   Title "Demonstrate and verbalize techniques to reduce the risk of re-injury including: lifting, posture, body mechanics.    Baseline Pt is able to verbalize 3 strategies for better body mechancis   Time 6   Period Weeks   Status Achieved     PT SHORT TERM GOAL #2   Title "Pt will be independent with advanced HEP   Time 6   Period Weeks   Status On-going     PT SHORT TERM GOAL #3   Title "Pt will tolerate walking for 1 hours without increased pain  with LRAD   Baseline is trying to walk more on land   Time 6   Period Weeks   Status  On-going     PT SHORT TERM GOAL #4   Title Pt will be able to negotiate step without exacerbating  pain in back   Baseline pt 3-4 /10   Time 6   Period Weeks   Status On-going     PT SHORT TERM GOAL #5   Title pt will increase lower extremity MMT to 4/5 or greater for increased motor control   Time 6   Period Weeks   Status On-going     PT SHORT TERM GOAL #6   Title "FOTO will improve from 71% limitation   to 56% limitation     indicating improved functional mobility .    Time 6   Period Weeks   Status On-going     PT SHORT TERM GOAL #7   Title pt will be able to drive safely and turn head back without exacerbating pain    Baseline depending on back up camera   Time 6   Period Weeks   Status Achieved                  Plan - 12/19/16 1642    Clinical Impression Statement Patient is in atril Fib so exercise was avoided today.  She was referred to her MD about exercise.  No new goals met.  Patch with dexamethasone was helpful so it was continued. She was advised to not reuse her patch.   PT Next Visit Plan Exercise if cleared by MD,  Patch /Iontophoresis, core, clams in sidelying, quadriped if allowed   PT Home Exercise Plan ab set, pelvic tilt.  gave posture body mechanics handout, clams, abdominal bracing, gait inside with log. added knee step ups forward, down step and lateral sidelying clams with red tband and bridging   Consulted and Agree with Plan of Care Patient      Patient will benefit from skilled therapeutic intervention in order to improve the following deficits and impairments:  Difficulty walking, Decreased mobility, Decreased strength, Postural dysfunction, Improper body mechanics, Pain, Hypomobility, Decreased range of motion, Decreased activity tolerance, Decreased knowledge of use of DME  Visit Diagnosis: Midline low back pain without sciatica, unspecified chronicity  Difficulty in walking, not elsewhere classified  Abnormal posture  Muscle  weakness (generalized)     Problem List Patient  Active Problem List   Diagnosis Date Noted  . A-fib ([place]) 03/21/2016  . Calf pain 06/07/2013  . Atrial fibrillation ([place]) 04/02/2013  . PVC's (premature ventricular contractions) 01/12/2013  . Fibrocystic breast disease 10/30/2011  . Implantable loop recorder-St. Jude 07/09/2011  . Abnormal ECG-Possible ventricular preexcitation versus incomplete right bundle branch block   . Right bundle branch block   . Situational stress 03/13/2011  . HTN (hypertension)   . Chest pain     HARRIS,KAREN PTA 12/19/2016, 4:47 PM  [facility] [address] [place], [place], [address] Phone: [phone]   Fax:  [phone]  Name: Amber [name] MRN: [id number] Date of Birth: 15-Feb-1946

## 2016-12-23 ENCOUNTER — Ambulatory Visit: Payer: Medicare Other

## 2016-12-23 DIAGNOSIS — I480 Paroxysmal atrial fibrillation: Secondary | ICD-10-CM | POA: Diagnosis not present

## 2016-12-23 DIAGNOSIS — Z6830 Body mass index (BMI) 30.0-30.9, adult: Secondary | ICD-10-CM | POA: Diagnosis not present

## 2016-12-23 DIAGNOSIS — K219 Gastro-esophageal reflux disease without esophagitis: Secondary | ICD-10-CM | POA: Diagnosis not present

## 2016-12-23 DIAGNOSIS — E668 Other obesity: Secondary | ICD-10-CM | POA: Diagnosis not present

## 2016-12-23 DIAGNOSIS — F325 Major depressive disorder, single episode, in full remission: Secondary | ICD-10-CM | POA: Diagnosis not present

## 2016-12-23 DIAGNOSIS — E871 Hypo-osmolality and hyponatremia: Secondary | ICD-10-CM | POA: Diagnosis not present

## 2016-12-23 DIAGNOSIS — I10 Essential (primary) hypertension: Secondary | ICD-10-CM | POA: Diagnosis not present

## 2016-12-23 DIAGNOSIS — M545 Low back pain: Secondary | ICD-10-CM | POA: Diagnosis not present

## 2016-12-23 DIAGNOSIS — R7301 Impaired fasting glucose: Secondary | ICD-10-CM | POA: Diagnosis not present

## 2016-12-24 ENCOUNTER — Ambulatory Visit: Payer: Medicare Other | Admitting: Physical Therapy

## 2016-12-24 DIAGNOSIS — M545 Low back pain, unspecified: Secondary | ICD-10-CM

## 2016-12-24 DIAGNOSIS — R293 Abnormal posture: Secondary | ICD-10-CM | POA: Diagnosis not present

## 2016-12-24 DIAGNOSIS — R262 Difficulty in walking, not elsewhere classified: Secondary | ICD-10-CM | POA: Diagnosis not present

## 2016-12-24 DIAGNOSIS — M6281 Muscle weakness (generalized): Secondary | ICD-10-CM | POA: Diagnosis not present

## 2016-12-24 NOTE — Patient Instructions (Addendum)
From exercise drawer Standing ankle strength. All issued 2- 4 x a week 5 to 10 x each. Ankle bands red issued,  IV/EV DF 10 X each 2-4 x a week  Get aquatic shoes online on clearance for around$ 6:00

## 2016-12-24 NOTE — Therapy (Signed)
[place], [place], [address] Phone: [phone]   Fax:  [phone]  Physical Therapy Treatment  Patient Details  Name: Amber [name] MRN: [id number] Date of Birth: 10/04/1946 Referring Provider: Jenne Campus MD  Encounter Date: 12/24/2016      PT End of Session - 12/24/16 1837    Visit Number 8   Number of Visits 12   Date for PT Re-Evaluation 01/02/17   PT Start Time 5009   PT Stop Time 1500   PT Time Calculation (min) 45 min   Activity Tolerance Patient tolerated treatment well   Behavior During Therapy [facility] for tasks assessed/performed      Past Medical History:  Diagnosis Date  . Abdominal mass    pt unaware  . Arthritis   . Chest pain    + Tn s/p cath 2012--Normal  . History of tobacco abuse   . HTN (hypertension)   . Hyperlipemia   . IBS (irritable bowel syndrome)   . Obesity   . Osteoporosis   . Persistent atrial fibrillation (HCC)    three types  . Post-menopausal   . Right bundle branch block     Past Surgical History:  Procedure Laterality Date  . BREAST BIOPSY     left breast- benign  . BREAST REDUCTION SURGERY     BILATERAL  . CARDIAC CATHETERIZATION  02-25-2011   EF 60%. Left coronary artery arises and distributes normal, right coronary artery is a dominant vessel and is normal  . CARDIOVASCULAR STRESS TEST  08-27-2002   EF 76%  . CARPAL TUNNEL RELEASE    . ELECTROPHYSIOLOGIC STUDY N/A 03/21/2016   Procedure: Atrial Fibrillation Ablation;  Surgeon: Thompson Grayer, MD;  Location: [facility];  Service: Cardiovascular;  Laterality: N/A;  . LOOP RECORDER EXPLANT N/A 12/28/2014   Procedure: LOOP RECORDER EXPLANT;  Surgeon: Deboraha Sprang, MD  . OVARIAN CYST REMOVAL  1978  . TEE WITHOUT CARDIOVERSION N/A 03/20/2016   Procedure: TRANSESOPHAGEAL ECHOCARDIOGRAM (TEE);  Surgeon: Skeet Latch, MD;  Location: [facility];  Service: Cardiovascular;  Laterality: N/A;  .  TONSILLECTOMY AND ADENOIDECTOMY  AGE 13  . US ECHOCARDIOGRAPHY  12-23-2006   EF 55-60%    There were no vitals filed for this visit.                       [facility] Adult PT Treatment/Exercise - 12/24/16 0001      Self-Care   Other Self-Care Comments  Suggested order online shoes  for aquatic,  clearance found to be around 6.00     Lumbar Exercises: Aerobic   Stationary Bike Nu step 3 minutes L4   Tread Mill 1 MPH, level instruction on how to hold on and swing her shoulders to engage core anf require less of QL.     Lumbar Exercises: Machines for Strengthening   Other Lumbar Machine Exercise cybex hip flexion, abduction 10 x each , 1 plate.  discomfort right with hip abduction left/     Knee/Hip Exercises: Stretches   Gastroc Stretch 3 reps;30 seconds     Knee/Hip Exercises: Standing   Heel Raises 10 reps   Heel Raises Limitations unable to do single, HEP   SLS HEP not practiced     Knee/Hip Exercises: Seated   Other Seated Knee/Hip Exercises Hip ER/ IR 10 X, both, difficult     Iontophoresis   Type of Iontophoresis Dexamethasone   Location Rt SI  area   Dose 1 cc 4-6 hour patch   Time 8     Ankle Exercises: Seated   Other Seated Ankle Exercises Ankle bands, DF/IV/EV 10 x each, red band , HEP                PT Education - 12/24/16 1828    Education provided Yes   Education Details HEP   Person(s) Educated Patient   Methods Explanation;Demonstration;Tactile cues;Verbal cues;Handout   Comprehension Verbalized understanding;Returned demonstration  will need husband's help          PT Short Term Goals - 12/24/16 1843      PT SHORT TERM GOAL #1   Title "Demonstrate and verbalize techniques to reduce the risk of re-injury including: lifting, posture, body mechanics.    Status Achieved     PT SHORT TERM GOAL #2   Title "Pt will be independent with advanced HEP   Baseline She is independent with initial HEP   Time 6   Status Partially Met      PT SHORT TERM GOAL #3   Title "Pt will tolerate walking for 1 hours without increased pain  with LRAD   Time 6   Period Weeks   Status Unable to assess     PT SHORT TERM GOAL #4   Title Pt will be able to negotiate step without exacerbating  pain in back   Time 6   Period Weeks   Status Unable to assess     PT SHORT TERM GOAL #5   Title pt will increase lower extremity MMT to 4/5 or greater for increased motor control   Time 6   Period Weeks   Status Unable to assess     PT SHORT TERM GOAL #6   Title "FOTO will improve from 71% limitation   to 56% limitation     indicating improved functional mobility .    Time 6   Period Weeks   Status Unable to assess     PT SHORT TERM GOAL #7   Title pt will be able to drive safely and turn head back without exacerbating pain    Time 6   Period Weeks   Status Achieved                  Plan - 12/24/16 1837    Clinical Impression Statement Patient not in atrial FIb today so exercise was well tolerated. She discussed exercise with her GP yesterday and they were not too worried about her exercising (Per report)  She continues to have concerns about her balance and this was adderssed with the addition of ankle strengthening exercises for HEP.  Patient has increased Hip ER improved 1.5 inches since last visit, She is consistantly ably to tie her shoes.  Patch continues to be helpful.  She uses a walker at night when she gets up.  She does not use her cane inside her home.  She uses her cane in the community.    PT Next Visit Plan Review ankle bands and standing Patch /Iontophoresis, core, clams in sidelying, quadriped if allowed   PT Home Exercise Plan ab set, pelvic tilt.  gave posture body mechanics handout, clams, abdominal bracing, gait inside with log. added knee step ups forward, down step and lateral sidelying clams with red tband and bridging, ankle strength with bands and standing.   Consulted and Agree with Plan of Care Patient       Patient will benefit from skilled  therapeutic intervention in order to improve the following deficits and impairments:  Difficulty walking, Decreased mobility, Decreased strength, Postural dysfunction, Improper body mechanics, Pain, Hypomobility, Decreased range of motion, Decreased activity tolerance, Decreased knowledge of use of DME  Visit Diagnosis: Midline low back pain without sciatica, unspecified chronicity  Difficulty in walking, not elsewhere classified  Muscle weakness (generalized)  Abnormal posture     Problem List Patient Active Problem List   Diagnosis Date Noted  . A-fib ([place]) 03/21/2016  . Calf pain 06/07/2013  . Atrial fibrillation ([place]) 04/02/2013  . PVC's (premature ventricular contractions) 01/12/2013  . Fibrocystic breast disease 10/30/2011  . Implantable loop recorder-[employer] 07/09/2011  . Abnormal ECG-Possible ventricular preexcitation versus incomplete right bundle branch block   . Right bundle branch block   . Situational stress 03/13/2011  . HTN (hypertension)   . Chest pain     HARRIS,KAREN PTA 12/24/2016, 6:45 PM  [facility] [address] [place], [place], [address] Phone: [phone]   Fax:  [phone]  Name: Amber [name] MRN: [id number] Date of Birth: [birth year]

## 2016-12-25 ENCOUNTER — Ambulatory Visit (INDEPENDENT_AMBULATORY_CARE_PROVIDER_SITE_OTHER): Payer: Medicare Other | Admitting: Internal Medicine

## 2016-12-25 VITALS — BP 126/82 | HR 92 | Ht 64.0 in | Wt 169.0 lb

## 2016-12-25 DIAGNOSIS — K920 Hematemesis: Secondary | ICD-10-CM | POA: Insufficient documentation

## 2016-12-25 DIAGNOSIS — D649 Anemia, unspecified: Secondary | ICD-10-CM | POA: Diagnosis not present

## 2016-12-25 DIAGNOSIS — R0602 Shortness of breath: Secondary | ICD-10-CM

## 2016-12-25 DIAGNOSIS — K219 Gastro-esophageal reflux disease without esophagitis: Secondary | ICD-10-CM | POA: Insufficient documentation

## 2016-12-25 DIAGNOSIS — Z86010 Personal history of colonic polyps: Secondary | ICD-10-CM | POA: Insufficient documentation

## 2016-12-25 DIAGNOSIS — K589 Irritable bowel syndrome without diarrhea: Secondary | ICD-10-CM | POA: Insufficient documentation

## 2016-12-25 DIAGNOSIS — I10 Essential (primary) hypertension: Secondary | ICD-10-CM | POA: Diagnosis not present

## 2016-12-25 DIAGNOSIS — I480 Paroxysmal atrial fibrillation: Secondary | ICD-10-CM

## 2016-12-25 NOTE — Patient Instructions (Signed)
Medication Instructions:  Your physician recommends that you continue on your current medications as directed. Please refer to the Current Medication list given to you today.   Labwork: None ordered   Testing/Procedures: Your physician has requested that you have an echocardiogram. Echocardiography is a painless test that uses sound waves to create images of your heart. It provides your doctor with information about the size and shape of your heart and how well your heart's chambers and valves are working. This procedure takes approximately one hour. There are no restrictions for this procedure.    Follow-Up: Your physician recommends that you schedule a follow-up appointment in: 6 weeks wit Dr Rayann Heman   Any Other Special Instructions Will Be Listed Below (If Applicable).     If you need a refill on your cardiac medications before your next appointment, please call your pharmacy.

## 2016-12-25 NOTE — Progress Notes (Signed)
Electrophysiology Office Note   Date:  12/25/2016   ID:  Amber [name], DOB 1946/01/11, MRN [id number]  PCP:  Precious Reel, MD  Primary Electrophysiologist: Dr Caryl Comes  No chief complaint on file.    History of Present Illness: Amber [name] is a 71 y.o. female who presents today for electrophysiology evaluation.  She has developed anemia.  No obvious source.  Workup is ongoing by Dr Virgina Jock.  + dizziness and SOB but denies chest pain.  Rare palpitations but no sustained afib that we are aware of. Today, she denies symptoms of chest pain, shortness of breath, orthopnea, PND, claudication, dizziness, presyncope, syncope, bleeding, or neurologic sequela. The patient is tolerating medications without difficulties and is otherwise without complaint today.    Past Medical History:  Diagnosis Date  . Abdominal mass    pt unaware  . Arthritis   . Chest pain    + Tn s/p cath 2012--Normal  . History of tobacco abuse   . HTN (hypertension)   . Hyperlipemia   . IBS (irritable bowel syndrome)   . Obesity   . Osteoporosis   . Persistent atrial fibrillation (HCC)    three types  . Post-menopausal   . Right bundle branch block    Past Surgical History:  Procedure Laterality Date  . BREAST BIOPSY     left breast- benign  . BREAST REDUCTION SURGERY     BILATERAL  . CARDIAC CATHETERIZATION  02-25-2011   EF 60%. Left coronary artery arises and distributes normal, right coronary artery is a dominant vessel and is normal  . CARDIOVASCULAR STRESS TEST  08-27-2002   EF 76%  . CARPAL TUNNEL RELEASE    . ELECTROPHYSIOLOGIC STUDY N/A 03/21/2016   Procedure: Atrial Fibrillation Ablation;  Surgeon: Thompson Grayer, MD;  Location: [facility];  Service: Cardiovascular;  Laterality: N/A;  . LOOP RECORDER EXPLANT N/A 12/28/2014   Procedure: LOOP RECORDER EXPLANT;  Surgeon: Deboraha Sprang, MD  . OVARIAN CYST REMOVAL  1978  . TEE WITHOUT CARDIOVERSION N/A 03/20/2016   Procedure: TRANSESOPHAGEAL  ECHOCARDIOGRAM (TEE);  Surgeon: Skeet Latch, MD;  Location: [facility];  Service: Cardiovascular;  Laterality: N/A;  . TONSILLECTOMY AND ADENOIDECTOMY  AGE 4  . US ECHOCARDIOGRAPHY  12-23-2006   EF 55-60%     Current Outpatient Prescriptions  Medication Sig Dispense Refill  . acetaminophen (TYLENOL) 500 MG tablet Take 500 mg by mouth daily as needed for mild pain or headache.    Marland Kitchen amLODipine (NORVASC) 5 MG tablet Take 1 tablet (5 mg total) by mouth daily. 180 tablet 3  . Cholecalciferol (VITAMIN D PO) Take 1 tablet by mouth daily.     Marland Kitchen ELIQUIS 5 MG TABS tablet TAKE 1 TABLET BY MOUTH TWICE DAILY 180 tablet 3  . esomeprazole (NEXIUM) 40 MG capsule Take 40 mg by mouth daily as needed (heartburn).     . fish oil-omega-3 fatty acids 1000 MG capsule Take 2 g by mouth daily with lunch.     . losartan-hydrochlorothiazide (HYZAAR) 100-25 MG tablet TAKE 1 TABLET BY MOUTH DAILY 90 tablet 3  . metoprolol succinate (TOPROL-XL) 100 MG 24 hr tablet Take 100 mg by mouth every morning. Take with or immediately following a meal.    . rosuvastatin (CRESTOR) 5 MG tablet Take 5 mg by mouth every other day.    . spironolactone (ALDACTONE) 25 MG tablet Take 1 tablet (25 mg total) by mouth daily. (Patient not taking: Reported on 12/25/2016) 90 tablet 3  No current facility-administered medications for this visit.     Allergies:   Ciprofloxacin   Social History:  The patient  reports that she quit smoking about 48 years ago. Her smoking use included Cigarettes. She started smoking about 49 years ago. She quit after 1.00 year of use. She has never used smokeless tobacco. She reports that she drinks about 4.2 oz of alcohol per week . She reports that she does not use drugs.   Family History:  The patient's  family history includes Atrial fibrillation in her mother; Diabetes in her father; Heart failure in her father; Prostate cancer in her father.    ROS:  Please see the history of present illness.   All  other systems are reviewed and negative.    PHYSICAL EXAM: VS:  BP 126/82   Pulse 92   Ht 5\' 4"  (1.626 m)   Wt 169 lb (76.7 kg)   BMI 29.01 kg/m  , BMI Body mass index is 29.01 kg/m. GEN: Well nourished, well developed, in no acute distress  HEENT: normal except for pale conjunctiva Neck: no JVD, carotid bruits, or masses Cardiac: RRR; no murmurs, rubs, or gallops,no edema  Respiratory:  clear to auscultation bilaterally, normal work of breathing GI: soft, nontender, nondistended, + BS MS: no deformity or atrophy  Skin: warm and dry  Neuro:  Strength and sensation are intact Psych: euthymic mood, full affect  EKG:  EKG is ordered today. The ekg ordered today shows sinus rhythm 92 bpm, RBBB, nonspecific ST/T changes   Lipid Panel     Component Value Date/Time   CHOL 192 04/15/2012 1004   TRIG 61.0 04/15/2012 1004   HDL 93.70 04/15/2012 1004   CHOLHDL 2 04/15/2012 1004   VLDL 12.2 04/15/2012 1004   LDLCALC 86 04/15/2012 1004     Wt Readings from Last 3 Encounters:  12/25/16 169 lb (76.7 kg)  09/26/16 178 lb 6.4 oz (80.9 kg)  06/24/16 174 lb 3.2 oz (79 kg)     ASSESSMENT AND PLAN:  1.  Persistent afib Doing well s/p ablation off AAD therapy Continue long term anticoagulation History suggests sinus with ectopy rather than afib Would consider another loop recorder vs Kardia event monitoring if she has worsening of symptoms chads2vasc score is 3.  We discussed stopping anticoagulation in the setting of anemia (see below).  For now, she would prefer to continue anticoagulation.  She will stop immediately should she have worsening symptoms of anemia or any signs of GI bleeding. Echo to evaluate for structural changes given SOB though I suspect anemia is the cause  2. HTN Stable No change required today  3. Anemia hemaglobin earlier this week was 9 (baseline 13.5) Symptoms of dizziness and SOB are likely due to anemia Dr Virgina Jock to repeat cbc in 2 weeks If she has  any signs of Gi bleeding, she will contact Dr Virgina Jock  4. SOB Likely due to anemia Prior cath did not show CAD Will repeat echo at this time  Return to see me in 6 weeks  Signed, Thompson Grayer, MD  12/25/2016 2:30 PM     [place] [address] [phone] (office) [phone] (fax)

## 2016-12-26 ENCOUNTER — Ambulatory Visit: Payer: Medicare Other | Admitting: Physical Therapy

## 2016-12-26 ENCOUNTER — Encounter: Payer: Self-pay | Admitting: Physical Therapy

## 2016-12-26 DIAGNOSIS — M545 Low back pain, unspecified: Secondary | ICD-10-CM

## 2016-12-26 DIAGNOSIS — R262 Difficulty in walking, not elsewhere classified: Secondary | ICD-10-CM | POA: Diagnosis not present

## 2016-12-26 DIAGNOSIS — M6281 Muscle weakness (generalized): Secondary | ICD-10-CM | POA: Diagnosis not present

## 2016-12-26 DIAGNOSIS — D649 Anemia, unspecified: Secondary | ICD-10-CM | POA: Diagnosis not present

## 2016-12-26 DIAGNOSIS — R293 Abnormal posture: Secondary | ICD-10-CM

## 2016-12-26 NOTE — Patient Instructions (Signed)
Lumbar stabilization 2 handout.  Pelvic til 10 x Bicycle with abdominal set x 10 While holding pelvic tilt and abd set slowly  staighten one leg and keep other leg bent , bring other knee back to chest.  Dead bug only legs x 10 ( only legs) Dead bug x [place], PT Exercise Expert for the Aging Adult  12/26/16 2:40 PM Phone: [phone] Fax: [phone]

## 2016-12-26 NOTE — Therapy (Signed)
[place], [place], [address] Phone: [phone]   Fax:  [phone]  Physical Therapy Treatment/ Recertification  Patient Details  Name: Amber [name] MRN: [id number] Date of Birth: December 25, 1945 Referring Provider: Jenne Campus MD  Encounter Date: 12/26/2016      PT End of Session - 12/26/16 1417    Visit Number 9   Number of Visits 16   Date for PT Re-Evaluation 01/23/17   Authorization Type [employer]   PT Start Time 0215   PT Stop Time 0300   PT Time Calculation (min) 45 min   Activity Tolerance Patient tolerated treatment well   Behavior During Therapy [facility] for tasks assessed/performed      Past Medical History:  Diagnosis Date  . Abdominal mass    pt unaware  . Arthritis   . Chest pain    + Tn s/p cath 2012--Normal  . History of tobacco abuse   . HTN (hypertension)   . Hyperlipemia   . IBS (irritable bowel syndrome)   . Obesity   . Osteoporosis   . Persistent atrial fibrillation (HCC)    three types  . Post-menopausal   . Right bundle branch block     Past Surgical History:  Procedure Laterality Date  . BREAST BIOPSY     left breast- benign  . BREAST REDUCTION SURGERY     BILATERAL  . CARDIAC CATHETERIZATION  02-25-2011   EF 60%. Left coronary artery arises and distributes normal, right coronary artery is a dominant vessel and is normal  . CARDIOVASCULAR STRESS TEST  08-27-2002   EF 76%  . CARPAL TUNNEL RELEASE    . ELECTROPHYSIOLOGIC STUDY N/A 03/21/2016   Procedure: Atrial Fibrillation Ablation;  Surgeon: Thompson Grayer, MD;  Location: [facility];  Service: Cardiovascular;  Laterality: N/A;  . LOOP RECORDER EXPLANT N/A 12/28/2014   Procedure: LOOP RECORDER EXPLANT;  Surgeon: Deboraha Sprang, MD  . OVARIAN CYST REMOVAL  1978  . TEE WITHOUT CARDIOVERSION N/A 03/20/2016   Procedure: TRANSESOPHAGEAL ECHOCARDIOGRAM (TEE);  Surgeon: Skeet Latch, MD;   Location: [facility];  Service: Cardiovascular;  Laterality: N/A;  . TONSILLECTOMY AND ADENOIDECTOMY  AGE 71  . US ECHOCARDIOGRAPHY  12-23-2006   EF 55-60%    There were no vitals filed for this visit.      Subjective Assessment - 12/26/16 1415    Subjective My MD said that I was anemic contributed to the A-fib.  I cannot do the exercises while standing, but would like to try in other positions   Pertinent History PLIF L2/3, L4/5 and L5 S1 , BBB, PVC's  A-fib infrequently since Ablation   Limitations Standing;Walking;House hold activities   How long can you sit comfortably?  2 hours    How long can you stand comfortably? 20 minutes   How long can you walk comfortably? 20 minutes   Diagnostic tests MRI   Patient Stated Goals I want to be able to walk without a cane, do normal household chores laundry, unload dish washer, going up stairs   Currently in Pain? Yes   Pain Score 5   .5/10 today   Pain Location Back   Pain Orientation Right   Pain Descriptors / Indicators Aching   Pain Type Surgical pain   Pain Onset More than a month ago   Pain Frequency Intermittent            [facility] PT Assessment - 12/26/16 1446  Observation/Other Assessments   Focus on Therapeutic Outcomes (FOTO)  FOTO intake53% limtation 47%, predictred 56%     AROM   Overall AROM  Deficits   Lumbar Flexion 60   Lumbar Extension 15  ERP   Lumbar - Right Side Bend 20  ERP   Lumbar - Left Side Bend 20  ERP   Lumbar - Right Rotation 75%   Lumbar - Left Rotation 75%     Strength   Overall Strength Deficits   Right Hip Flexion 4/5   Right Hip Extension 4/5   Right Hip ABduction 4-/5   Left Hip Flexion 4-/5   Left Hip Extension 4-/5   Left Hip ABduction 4-/5   Right Knee Flexion 4/5   Right Knee Extension 4+/5   Left Knee Flexion 4-/5   Left Knee Extension 4-/5   Right Ankle Dorsiflexion 4/5   Left Ankle Dorsiflexion 4-/5                     [facility] Adult PT Treatment/Exercise -  12/26/16 1443      Lumbar Exercises: Supine   Other Supine Lumbar Exercises pelvic tilt x 10, while holding pelvic tilt  bicycle x 5 x 2,  , hold pelvic tilt and bend one knee at a time while straightening opposite leg x 10 bil, dead bug x 10 bil     Lumbar Exercises: Quadruped   [place]/Old Horse 5 reps   Single Arm Raise 5 reps   Single Arm Raise Weights (lbs) fatigues easily bil   Straight Leg Raise 5 reps   Straight Leg Raises Limitations bil   Opposite Arm/Leg Raise 5 reps   Opposite Arm/Leg Raise Limitations bil fatigues easily and requires VC for mainitaining core abdominal engagement     Knee/Hip Exercises: Stretches   Gastroc Stretch 3 reps;30 seconds     Knee/Hip Exercises: Standing   Heel Raises 10 reps   Heel Raises Limitations left sLS with march does not have as great an excursion as right     Knee/Hip Exercises: Seated   Other Seated Knee/Hip Exercises Hip ER/ IR 10 X, both, difficult     Iontophoresis   Type of Iontophoresis --   Location --   Dose --   Time --     Ankle Exercises: Seated   Other Seated Ankle Exercises Ankle bands, DF/IV/EV 10 x each, red band , HEP                PT Education - 12/26/16 1442    Education provided Yes   Education Details added Lumbar stabilization 2 to HEP.  Pt tried to do Quadriped  but has decreased motor control and needs to increase strength   Person(s) Educated Patient   Methods Explanation;Demonstration;Tactile cues;Verbal cues;Handout   Comprehension Verbalized understanding;Returned demonstration          PT Short Term Goals - 12/26/16 1451      PT SHORT TERM GOAL #1   Title "Demonstrate and verbalize techniques to reduce the risk of re-injury including: lifting, posture, body mechanics.    Baseline Pt is able to verbalize 3 strategies for better body mechancis   Time 6   Period Weeks   Status Achieved     PT SHORT TERM GOAL #2   Title "Pt will be independent with advanced HEP   Baseline She is  independent with initial HEP   Time 6   Period Weeks   Status Achieved  PT SHORT TERM GOAL #3   Title "Pt will tolerate walking for 30 minutes without increased pain  with LRAD   Baseline 20 minutes due to endurance and dyspnea   Time 6   Period Weeks   Status On-going     PT SHORT TERM GOAL #4   Title Pt will be able to negotiate step without exacerbating  pain in back   Baseline pt with 1/10   Period Weeks   Status Partially Met     PT SHORT TERM GOAL #5   Title pt will increase lower extremity MMT to 4/5 or greater for increased motor control   Baseline Hips 4-/5 now in abduction  See flowsheet   Time 6   Period Weeks   Status On-going     PT SHORT TERM GOAL #6   Title "FOTO will improve from 71% limitation   to 56% limitation     indicating improved functional mobility .    Baseline Foto improved to 47% limitation   Time 6   Period Weeks   Status Achieved     PT SHORT TERM GOAL #7   Title pt will be able to drive safely and turn head back without exacerbating pain    Baseline depending on back up camera   Time 6   Period Weeks   Status Achieved           PT Long Term Goals - 12/26/16 1455      PT LONG TERM GOAL #1   Title Pt will find a community wellness center to continue strengthening post PT   Time 4   Period Weeks   Status New     PT LONG TERM GOAL #2   Title pt will increase lower extremity MMT to 4/5 or greater for increased motor control   Time 4   Period Weeks   Status Revised     PT LONG TERM GOAL #3   Title Pt will be aware of  community wellness program for continued strengthening and endurance training.   Time 4   Period Weeks   Status New     PT LONG TERM GOAL #4   Title Pt will be independent with advanced HEP   Time 4   Period Weeks   Status New               Plan - 12/26/16 1655    Clinical Impression Statement Pt has completed most of  her STG's and is mostly limited by her endurance /A-fib.  she has improved her  MMT to 4-/5 and has acheived FOTO goal.  she is mostly a 5/10 pain level but did report a .5/10 today.  She was challenged in supine and quadriped exercises and their is evidence of need to progress core strengthening.  she would benefit from continued skilled PT to reiniforce advanced HEP for an additional 4 weeks as she transitions to community wellness program.  Please recertify for an additional 4 weeks in order to achieve LTG functional and strength goals   Rehab Potential Good   PT Frequency 2x / week   PT Duration 6 weeks   PT Treatment/Interventions ADLs/Self Care Home Management;Cryotherapy;Electrical Stimulation;Iontophoresis 88m/ml Dexamethasone;Stair training;Gait training;Moist Heat;Ultrasound;Therapeutic exercise;Neuromuscular re-education;Patient/family education;Passive range of motion;Manual techniques;Taping   PT Next Visit Plan , core, clams in sidelying, quadriped if allowed, quadriped and supine core progression/lumbar stabilization   PT Home Exercise Plan ab set, pelvic tilt.  gave posture body mechanics handout, clams, abdominal bracing,  gait inside with log. added knee step ups forward, down step and lateral sidelying clams with red tband and bridging, ankle strength with bands and standing. dead bug lumbar stab 2   Consulted and Agree with Plan of Care Patient      Patient will benefit from skilled therapeutic intervention in order to improve the following deficits and impairments:  Difficulty walking, Decreased mobility, Decreased strength, Postural dysfunction, Improper body mechanics, Pain, Hypomobility, Decreased range of motion, Decreased activity tolerance, Decreased knowledge of use of DME  Visit Diagnosis: Midline low back pain without sciatica, unspecified chronicity  Difficulty in walking, not elsewhere classified  Muscle weakness (generalized)  Abnormal posture       G-Codes - 03-Jan-2017 1649    Functional Assessment Tool Used FOTO   Functional Limitation  Mobility: Walking and moving around   Mobility: Walking and Moving Around Current Status [phone]) At least 40 percent but less than 60 percent impaired, limited or restricted  47%   Mobility: Walking and Moving Around Goal Status [phone]) At least 40 percent but less than 60 percent impaired, limited or restricted      Problem List Patient Active Problem List   Diagnosis Date Noted  . Esophageal reflux 12/25/2016  . Gastroesophageal reflux disease without esophagitis 12/25/2016  . Hematemesis 12/25/2016  . History of colonic polyps 12/25/2016  . Irritable bowel syndrome 12/25/2016  . S/P lumbar fusion 10/21/2016  . Spinal stenosis of lumbar region with neurogenic claudication 09/11/2016  . Spondylolisthesis, lumbar region 09/11/2016  . A-fib ([place]) 03/21/2016  . Chronic pansinusitis 02/21/2016  . Facial pain, acute 02/21/2016  . Laryngopharyngeal reflux (LPR) 02/21/2016  . Chronic bilateral low back pain without sciatica 01/09/2016  . Degeneration, intervertebral disc, lumbar 01/09/2016  . Lumbar spinal stenosis 01/09/2016  . Spondylolisthesis at L2-L3 level 01/09/2016  . Calf pain 06/07/2013  . Atrial fibrillation ([place]) 04/02/2013  . PVC's (premature ventricular contractions) 01/12/2013  . Fibrocystic breast disease 10/30/2011  . Implantable loop recorder-[employer] 07/09/2011  . Abnormal ECG-Possible ventricular preexcitation versus incomplete right bundle branch block   . Right bundle branch block   . Situational stress 03/13/2011  . HTN (hypertension)   . Chest pain    Voncille Lo, PT Exercise Expert for the Aging Adult  2017/01/03 5:12 PM Phone: [phone] Fax: [facility] [address] [place], [place], [address] Phone: [phone]   Fax:  [phone]  Name: Amber [name]   Physical Therapy Progress Note  Dates of Reporting Period: 11-21-17 to 03-Jan-2017  Objective Reports of Subjective  Statement:  Pt has difficulty continuing exercises in standing and needs additional strengthening  Objective Measurements: See flow sheet  Goal Update: as above  Plan: as above  Reason Skilled Services are Required: Pain fluctuates , normally a 5/10,  Pt has poor endurance and does not exhibit good motor control in quadriped.  Pt also has weak ankle plantarflexion on left and does not have full excursion of left ankle in single limb stance.  Increasing strength would help her have more control in gait.  MRN: [id number] Date of Birth: 04/15/1946

## 2016-12-30 DIAGNOSIS — D649 Anemia, unspecified: Secondary | ICD-10-CM | POA: Diagnosis not present

## 2016-12-30 DIAGNOSIS — D72819 Decreased white blood cell count, unspecified: Secondary | ICD-10-CM | POA: Diagnosis not present

## 2016-12-31 ENCOUNTER — Ambulatory Visit: Payer: Medicare Other | Admitting: Physical Therapy

## 2017-01-02 ENCOUNTER — Encounter (HOSPITAL_COMMUNITY): Payer: Self-pay | Admitting: Emergency Medicine

## 2017-01-02 ENCOUNTER — Observation Stay (HOSPITAL_COMMUNITY)
Admission: EM | Admit: 2017-01-02 | Discharge: 2017-01-03 | Disposition: A | Discharge status: Home / Self Care | Payer: Medicare Other | Attending: Family Medicine | Admitting: Family Medicine

## 2017-01-02 ENCOUNTER — Emergency Department (HOSPITAL_COMMUNITY): Payer: Medicare Other

## 2017-01-02 ENCOUNTER — Ambulatory Visit: Payer: Medicare Other | Admitting: Physical Therapy

## 2017-01-02 DIAGNOSIS — R079 Chest pain, unspecified: Secondary | ICD-10-CM | POA: Diagnosis present

## 2017-01-02 DIAGNOSIS — Z7901 Long term (current) use of anticoagulants: Secondary | ICD-10-CM | POA: Insufficient documentation

## 2017-01-02 DIAGNOSIS — Z87891 Personal history of nicotine dependence: Secondary | ICD-10-CM | POA: Diagnosis not present

## 2017-01-02 DIAGNOSIS — R002 Palpitations: Secondary | ICD-10-CM | POA: Insufficient documentation

## 2017-01-02 DIAGNOSIS — R55 Syncope and collapse: Secondary | ICD-10-CM

## 2017-01-02 DIAGNOSIS — E876 Hypokalemia: Secondary | ICD-10-CM | POA: Diagnosis present

## 2017-01-02 DIAGNOSIS — R0789 Other chest pain: Principal | ICD-10-CM | POA: Diagnosis present

## 2017-01-02 DIAGNOSIS — R0602 Shortness of breath: Secondary | ICD-10-CM

## 2017-01-02 DIAGNOSIS — Z79899 Other long term (current) drug therapy: Secondary | ICD-10-CM | POA: Diagnosis not present

## 2017-01-02 DIAGNOSIS — K219 Gastro-esophageal reflux disease without esophagitis: Secondary | ICD-10-CM | POA: Diagnosis present

## 2017-01-02 DIAGNOSIS — I10 Essential (primary) hypertension: Secondary | ICD-10-CM | POA: Insufficient documentation

## 2017-01-02 DIAGNOSIS — R918 Other nonspecific abnormal finding of lung field: Secondary | ICD-10-CM | POA: Diagnosis not present

## 2017-01-02 DIAGNOSIS — Z955 Presence of coronary angioplasty implant and graft: Secondary | ICD-10-CM | POA: Insufficient documentation

## 2017-01-02 LAB — MAGNESIUM: Magnesium: 1.9 mg/dL (ref 1.7–2.4)

## 2017-01-02 LAB — BASIC METABOLIC PANEL
ANION GAP: 13 (ref 5–15)
BUN: 9 mg/dL (ref 6–20)
CALCIUM: 10.1 mg/dL (ref 8.9–10.3)
CO2: 23 mmol/L (ref 22–32)
Chloride: 95 mmol/L — ABNORMAL LOW (ref 101–111)
Creatinine, Ser: 0.62 mg/dL (ref 0.44–1.00)
GFR calc Af Amer: >60 mL/min (ref >60)
GLUCOSE: 151 mg/dL — AB (ref 65–99)
Potassium: 2.8 mmol/L — ABNORMAL LOW (ref 3.5–5.1)
SODIUM: 131 mmol/L — AB (ref 135–145)

## 2017-01-02 LAB — CBC
HCT: 33.9 % — ABNORMAL LOW (ref 36.0–46.0)
Hemoglobin: 10.6 g/dL — ABNORMAL LOW (ref 12.0–15.0)
MCH: 24.7 pg — ABNORMAL LOW (ref 26.0–34.0)
MCHC: 31.3 g/dL (ref 30.0–36.0)
MCV: 79 fL (ref 78.0–100.0)
Platelets: 458 10*3/uL — ABNORMAL HIGH (ref 150–400)
RBC: 4.29 MIL/uL (ref 3.87–5.11)
RDW: 16.8 % — AB (ref 11.5–15.5)
WBC: 5.7 10*3/uL (ref 4.0–10.5)

## 2017-01-02 LAB — D-DIMER, QUANTITATIVE: D-Dimer, Quant: 0.59 ug/mL-FEU — ABNORMAL HIGH (ref 0.00–0.50)

## 2017-01-02 LAB — TSH: TSH: 3.176 u[IU]/mL (ref 0.350–4.500)

## 2017-01-02 LAB — BRAIN NATRIURETIC PEPTIDE: B Natriuretic Peptide: 30.7 pg/mL (ref 0.0–100.0)

## 2017-01-02 LAB — I-STAT TROPONIN, ED: TROPONIN I, POC: 0 ng/mL (ref 0.00–0.08)

## 2017-01-02 LAB — TROPONIN I: Troponin I: <0.03 ng/mL (ref <0.03)

## 2017-01-02 MED ORDER — IOPAMIDOL (ISOVUE-370) INJECTION 76%
INTRAVENOUS | Status: AC
  Administered 2017-01-02: 100 mL
  Filled 2017-01-02: qty 100

## 2017-01-02 MED ORDER — DIAZEPAM 2 MG PO TABS
2.0000 mg | ORAL_TABLET | Freq: Once | ORAL | Status: AC
  Administered 2017-01-03: 2 mg via ORAL
  Filled 2017-01-02: qty 1

## 2017-01-02 MED ORDER — POTASSIUM CHLORIDE CRYS ER 20 MEQ PO TBCR
40.0000 meq | EXTENDED_RELEASE_TABLET | Freq: Once | ORAL | Status: AC
  Administered 2017-01-02: 40 meq via ORAL
  Filled 2017-01-02: qty 2

## 2017-01-02 MED ORDER — ACETAMINOPHEN 325 MG PO TABS
650.0000 mg | ORAL_TABLET | Freq: Once | ORAL | Status: AC
  Administered 2017-01-02: 650 mg via ORAL
  Filled 2017-01-02: qty 2

## 2017-01-02 MED ORDER — DIPHENHYDRAMINE HCL 25 MG PO CAPS: 25.0000 mg | ORAL_CAPSULE | Freq: Once | ORAL | Status: DC

## 2017-01-02 NOTE — ED Notes (Signed)
Lab to add on troponin  

## 2017-01-02 NOTE — ED Provider Notes (Signed)
[facility] DEPT Provider Note   CSN: [id number] Arrival date & time: 01/02/17  1320     History   Chief Complaint Chief Complaint  Patient presents with  . Shortness of Breath    HPI Amber [name] is a 71 y.o. female.  HPI   Had not had symptoms of dyspnea, fatigue until surgery.  For last 3 mos it has been worse.Found out anemic at 48mos check up. Had been blaming her fatigue and dizzy episodes on back surgery, however now wondering if it was anemia. Saw Cardiologist 1/10 and thought symptoms were anemia with number 9 from 13 prior.  However since that visit she has been feeling worse, particularly in the last 3 days and worst of all last night. Feeling a lot of palpitations and dyspnea worse last night. Felt like couldn't lay down, needed oxygen, struggling to breath, irregular heart beat, was fast.   Any type of exertion makes dyspnea, chest tightness worse, feels like can't breath.  Feels chest tightness associated.  Started on iron for anemia.  Taken off eliquis but started back on it this morning given symptoms of palpitations. Had ablation at [facility] with Dr. Rayann Heman.    Gastroenterologist Buccini did stool sample neg for blood in examination of anemia.   Last cath 6 02/25/2011 years ago negative  Past Medical History:  Diagnosis Date  . Abdominal mass    pt unaware  . Arthritis   . Chest pain    + Tn s/p cath 2012--Normal  . History of tobacco abuse   . HTN (hypertension)   . Hyperlipemia   . IBS (irritable bowel syndrome)   . Obesity   . Osteoporosis   . Persistent atrial fibrillation (HCC)    three types  . Post-menopausal   . Right bundle branch block     Patient Active Problem List   Diagnosis Date Noted  . Hypokalemia 01/03/2017  . Chest pain 01/03/2017  . Chest tightness 01/02/2017  . Esophageal reflux 12/25/2016  . Gastroesophageal reflux disease without esophagitis 12/25/2016  . Hematemesis 12/25/2016  . History of colonic polyps 12/25/2016  .  Irritable bowel syndrome 12/25/2016  . S/P lumbar fusion 10/21/2016  . Spinal stenosis of lumbar region with neurogenic claudication 09/11/2016  . Spondylolisthesis, lumbar region 09/11/2016  . A-fib ([place]) 03/21/2016  . Chronic pansinusitis 02/21/2016  . Facial pain, acute 02/21/2016  . Laryngopharyngeal reflux (LPR) 02/21/2016  . Chronic bilateral low back pain without sciatica 01/09/2016  . Degeneration, intervertebral disc, lumbar 01/09/2016  . Lumbar spinal stenosis 01/09/2016  . Spondylolisthesis at L2-L3 level 01/09/2016  . Calf pain 06/07/2013  . Atrial fibrillation ([place]) 04/02/2013  . PVC's (premature ventricular contractions) 01/12/2013  . Fibrocystic breast disease 10/30/2011  . Implantable loop recorder-[employer] 07/09/2011  . Abnormal ECG-Possible ventricular preexcitation versus incomplete right bundle branch block   . Right bundle branch block   . Situational stress 03/13/2011  . HTN (hypertension)   . Chest pain     Past Surgical History:  Procedure Laterality Date  . BREAST BIOPSY     left breast- benign  . BREAST REDUCTION SURGERY     BILATERAL  . CARDIAC CATHETERIZATION  02-25-2011   EF 60%. Left coronary artery arises and distributes normal, right coronary artery is a dominant vessel and is normal  . CARDIOVASCULAR STRESS TEST  08-27-2002   EF 76%  . CARPAL TUNNEL RELEASE    . ELECTROPHYSIOLOGIC STUDY N/A 03/21/2016   Procedure: Atrial Fibrillation Ablation;  Surgeon: Jeneen Rinks  Allred, MD;  Location: [facility];  Service: Cardiovascular;  Laterality: N/A;  . LOOP RECORDER EXPLANT N/A 12/28/2014   Procedure: LOOP RECORDER EXPLANT;  Surgeon: Deboraha Sprang, MD  . OVARIAN CYST REMOVAL  1978  . TEE WITHOUT CARDIOVERSION N/A 03/20/2016   Procedure: TRANSESOPHAGEAL ECHOCARDIOGRAM (TEE);  Surgeon: Skeet Latch, MD;  Location: [facility];  Service: Cardiovascular;  Laterality: N/A;  . TONSILLECTOMY AND ADENOIDECTOMY  AGE 3  . US ECHOCARDIOGRAPHY  12-23-2006   EF  55-60%    OB History    No data available       Home Medications    Prior to Admission medications   Medication Sig Start Date End Date Taking? Authorizing Provider  acetaminophen (TYLENOL) 500 MG tablet Take 500 mg by mouth daily as needed for mild pain or headache.   Yes Historical Provider, MD  amLODipine (NORVASC) 5 MG tablet Take 1 tablet (5 mg total) by mouth daily. 06/24/16  Yes Thompson Grayer, MD  Cholecalciferol (VITAMIN D PO) Take 1 tablet by mouth daily.    Yes Historical Provider, MD  diazepam (VALIUM) 10 MG tablet Take 5 mg by mouth at bedtime as needed for sleep. 10/21/16  Yes Historical Provider, MD  ELIQUIS 5 MG TABS tablet TAKE 1 TABLET BY MOUTH TWICE DAILY 07/18/16  Yes Deboraha Sprang, MD  esomeprazole (NEXIUM) 40 MG capsule Take 40 mg by mouth daily as needed (heartburn).    Yes Thayer Headings, MD  ferrous sulfate 325 (65 FE) MG tablet Take 325 mg by mouth daily with lunch.   Yes Historical Provider, MD  fish oil-omega-3 fatty acids 1000 MG capsule Take 1 g by mouth daily with lunch.    Yes Historical Provider, MD  folic acid (FOLVITE) A999333 MCG tablet Take 400 mcg by mouth daily.   Yes Historical Provider, MD  loperamide (IMODIUM A-D) 2 MG tablet Take 2 mg by mouth 4 (four) times daily as needed for diarrhea or loose stools.   Yes Historical Provider, MD  losartan-hydrochlorothiazide (HYZAAR) 100-25 MG tablet TAKE 1 TABLET BY MOUTH DAILY 11/25/16  Yes Deboraha Sprang, MD  metoprolol succinate (TOPROL-XL) 100 MG 24 hr tablet Take 100 mg by mouth every morning. Take with or immediately following a meal.   Yes Historical Provider, MD  Multiple Vitamins-Minerals (AIRBORNE) LOZG Take 1 lozenge by mouth 4 (four) times daily as needed (for immune health).    Yes Historical Provider, MD  potassium chloride (K-DUR,KLOR-CON) 10 MEQ tablet Take 10 mEq by mouth daily. 12/30/16  Yes Historical Provider, MD  rosuvastatin (CRESTOR) 5 MG tablet Take 5 mg by mouth every other day.   Yes  Historical Provider, MD  spironolactone (ALDACTONE) 25 MG tablet Take 1 tablet (25 mg total) by mouth daily. Patient not taking: Reported on 12/25/2016 09/26/16 12/25/16  Thompson Grayer, MD    Family History Family History  Problem Relation Age of Onset  . Prostate cancer Father   . Heart failure Father   . Diabetes Father   . Atrial fibrillation Mother     Social History Social History  Substance Use Topics  . Smoking status: Former Smoker    Years: 1.00    Types: Cigarettes    Start date: 12/17/1967    Quit date: 12/16/1968  . Smokeless tobacco: Never Used     Comment: Only smoked about 2 packs a month  . Alcohol use 4.2 oz/week    7 Standard drinks or equivalent per week  Comment: 1-2 wine or burboun per day     Allergies   Ciprofloxacin   Review of Systems Review of Systems  Constitutional: Positive for fatigue. Negative for fever.  HENT: Negative for congestion and sore throat.   Eyes: Negative for visual disturbance.  Respiratory: Positive for shortness of breath. Negative for cough.   Cardiovascular: Positive for chest pain and palpitations. Negative for leg swelling.  Gastrointestinal: Positive for diarrhea (chronic, also black stools after iron). Negative for abdominal pain, constipation, nausea and vomiting.  Genitourinary: Negative for difficulty urinating.  Musculoskeletal: Negative for back pain and neck pain.  Skin: Negative for rash.  Neurological: Positive for light-headedness. Negative for syncope and headaches.     Physical Exam Updated Vital Signs BP (!) 154/68 (BP Location: Right Arm)   Pulse (!) 101   Temp 98 F (36.7 C) (Oral)   Resp 21   Ht 5\' 3"  (1.6 m)   Wt 166 lb 3.2 oz (75.4 kg)   SpO2 100%   BMI 29.44 kg/m   Physical Exam  Constitutional: She is oriented to person, place, and time. She appears well-developed and well-nourished. No distress.  HENT:  Head: Normocephalic and atraumatic.  Eyes: Conjunctivae and EOM are normal.    Neck: Normal range of motion.  Cardiovascular: Normal rate, regular rhythm, normal heart sounds and intact distal pulses.  Exam reveals no gallop and no friction rub.   No murmur heard. Pulmonary/Chest: Effort normal and breath sounds normal. No respiratory distress. She has no wheezes. She has no rales.  Abdominal: Soft. She exhibits no distension. There is no tenderness. There is no guarding.  Musculoskeletal: She exhibits no edema or tenderness.  Neurological: She is alert and oriented to person, place, and time.  Skin: Skin is warm and dry. No rash noted. She is not diaphoretic. No erythema.  Nursing note and vitals reviewed.    ED Treatments / Results  Labs (all labs ordered are listed, but only abnormal results are displayed) Labs Reviewed  BASIC METABOLIC PANEL - Abnormal; Notable for the following:       Result Value   Sodium 131 (*)    Potassium 2.8 (*)    Chloride 95 (*)    Glucose, Bld 151 (*)    All other components within normal limits  CBC - Abnormal; Notable for the following:    Hemoglobin 10.6 (*)    HCT 33.9 (*)    MCH 24.7 (*)    RDW 16.8 (*)    Platelets 458 (*)    All other components within normal limits  D-DIMER, QUANTITATIVE (NOT AT [facility]) - Abnormal; Notable for the following:    D-Dimer, Quant 0.59 (*)    All other components within normal limits  BRAIN NATRIURETIC PEPTIDE  TSH  MAGNESIUM  TROPONIN I  BRAIN NATRIURETIC PEPTIDE  TROPONIN I  TROPONIN I  TROPONIN I  I-STAT TROPOININ, ED    EKG  EKG Interpretation  Date/Time:  Thursday January 02 2017 13:24:36 EST Ventricular Rate:  117 PR Interval:  176 QRS Duration: 90 QT Interval:  322 QTC Calculation: 449 R Axis:   60 Text Interpretation:  Sinus tachycardia with Premature atrial complexes Low voltage QRS RSR' or QR pattern in V1 suggests right ventricular conduction delay ST & T wave abnormality, consider inferior ischemia ST & T wave abnormality, consider anterior ischemia Abnormal ECG  Rate has increased since prior ECG, rate related changes Confirmed by [facility] MD, Heran Campau ([id number]) on 01/02/2017 5:56:44 PM  Radiology Dg Chest 2 View  Result Date: 01/02/2017 CLINICAL DATA:  afib and anemic for few days,,,gets weak when standing EXAM: CHEST  2 VIEW COMPARISON:  None. FINDINGS: The heart size and mediastinal contours are within normal limits. Both lungs are clear. The visualized skeletal structures are unremarkable. Prominent rugal folds within the stomach. Cannot exclude followup point lesions. IMPRESSION: 1.  No acute cardiopulmonary process. 2. Prominent rugal folds.  Cannot exclude polyps within stomach. Electronically Signed   By: Suzy Bouchard M.D.   On: 01/02/2017 13:59   Ct Angio Chest Pe W And/or Wo Contrast  Result Date: 01/02/2017 CLINICAL DATA:  Chest tightness for weeks which worsened last night. EXAM: CT ANGIOGRAPHY CHEST WITH CONTRAST TECHNIQUE: Multidetector CT imaging of the chest was performed using the standard protocol during bolus administration of intravenous contrast. Multiplanar CT image reconstructions and MIPs were obtained to evaluate the vascular anatomy. CONTRAST:  100 cc Isovue 370. COMPARISON:  PA and lateral chest earlier today. Single-view of the chest 02/15/2011. FINDINGS: Cardiovascular: No pulmonary embolus is identified. Heart size is normal. No calcific coronary artery disease is identified. A few calcific atherosclerotic calcifications of the aorta are seen. 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 Mediastinum/Nodes: Small hiatal hernia is seen. No lymphadenopathy. Thyroid gland appears normal. Lungs/Pleura: No pleural effusion.  Lungs are clear. Upper Abdomen: Negative. Musculoskeletal: No acute bony abnormality. Review of the MIP images confirms the above findings. IMPRESSION: Negative for pulmonary embolus.  No acute disease. Mild aortic atherosclerosis. Small hiatal hernia. Electronically Signed   By: Inge Rise M.D.   On:  01/02/2017 20:32    Procedures Procedures (including critical care time)  Medications Ordered in ED Medications  diazepam (VALIUM) tablet 5 mg (not administered)  ferrous sulfate tablet 325 mg (not administered)  folic acid (FOLVITE) tablet 0.5 mg (not administered)  potassium chloride (K-DUR,KLOR-CON) CR tablet 10 mEq (not administered)  apixaban (ELIQUIS) tablet 5 mg (not administered)  amLODipine (NORVASC) tablet 5 mg (not administered)  metoprolol succinate (TOPROL-XL) 24 hr tablet 100 mg (not administered)  rosuvastatin (CRESTOR) tablet 5 mg (not administered)  pantoprazole (PROTONIX) EC tablet 40 mg (not administered)  omega-3 acid ethyl esters (LOVAZA) capsule 1 g (not administered)  losartan (COZAAR) tablet 100 mg (not administered)  acetaminophen (TYLENOL) tablet 650 mg (not administered)  ondansetron (ZOFRAN) injection 4 mg (not administered)  potassium chloride SA (K-DUR,KLOR-CON) CR tablet 40 mEq (40 mEq Oral Given 01/02/17 1852)  iopamidol (ISOVUE-370) 76 % injection (100 mLs  Contrast Given 01/02/17 2003)  potassium chloride SA (K-DUR,KLOR-CON) CR tablet 40 mEq (40 mEq Oral Given 01/02/17 2228)  acetaminophen (TYLENOL) tablet 650 mg (650 mg Oral Given 01/02/17 2250)  diazepam (VALIUM) tablet 2 mg (2 mg Oral Given 01/03/17 0027)  apixaban (ELIQUIS) tablet 5 mg (5 mg Oral Given 01/03/17 0027)     Initial Impression / Assessment and Plan / ED Course  I have reviewed the triage vital signs and the nursing notes.  Pertinent labs & imaging results that were available during my care of the patient were reviewed by me and considered in my medical decision making (see chart for details).     Skains, stop hyzaar, do losartan, follow up BMET  71 year old female with a history of hypertension, hyperlipidemia, persistent atrial fibrillation post-ablation on eliquis, back surgery 3 months ago, who presents with concern for dyspnea, lightheadedness, palpitations and chest tightness.  Patient presents with concern that she has worsening of the anemia that she developed after her back  surgery. Hemoglobin today is 10.6, do not feel this is responsible for all of her symptoms. She also noted to have hypokalemia of 2.8, which may contribute to some palpitations and generalized weakness. Given recent surgery, dyspnea and lightheadedness since this time with some breaks from eliquis, d-dimer was sent which was positive, CT PE study was done which showed no acute findings. There are no signs of pulmonary edema or pneumonia.    Initially discussed with Dr. Marlou Porch of cardiology, who recommended patient stop Hyzaar, do losartan only, and follow up BMP with PCP in one week with suspicion that hypokalemia as contributing to her symptoms.  He recommended that if she has 2 negative troponins she moved discharge with outpatient follow-up.  Patient's potassium repletion initiated with 40 mEq.  Delta troponins and PE study negative. Discussed plan with patient, however she reports she feels uncomfortable with discharge given significant palpitations, shortness of breath, chest tightness and lightheadedness she had last night. Her last cath was 6 years ago. Given her significant symptoms, feel observation in the hospital for arrhythmia, possible cardiac ischemia, or abnormalities is appropriate. We'll admit to hospitalist service for further care.   Final Clinical Impressions(s) / ED Diagnoses   Final diagnoses:  Chest tightness  Shortness of breath  Palpitations  Near syncope    New Prescriptions Current Discharge Medication List       Gareth Morgan, MD 01/03/17 [phone]

## 2017-01-02 NOTE — ED Notes (Signed)
Patient transported to CT 

## 2017-01-02 NOTE — ED Triage Notes (Signed)
Pt sts feels SOB and hx of afib; pt sts feels SOB with afib in past

## 2017-01-02 NOTE — ED Notes (Signed)
Pt c/o shortness of breath w/ any movement or exertion x several days. Hx surgery recently. Hx afib w/ ablation. NSR w/ PVCs at this time. Denies pain.

## 2017-01-02 NOTE — ED Notes (Signed)
Called lab to add on BNP. ?

## 2017-01-03 ENCOUNTER — Encounter (HOSPITAL_COMMUNITY): Payer: Self-pay | Admitting: Internal Medicine

## 2017-01-03 ENCOUNTER — Observation Stay (HOSPITAL_BASED_OUTPATIENT_CLINIC_OR_DEPARTMENT_OTHER): Payer: Medicare Other

## 2017-01-03 DIAGNOSIS — R0789 Other chest pain: Secondary | ICD-10-CM

## 2017-01-03 DIAGNOSIS — I10 Essential (primary) hypertension: Secondary | ICD-10-CM

## 2017-01-03 DIAGNOSIS — R079 Chest pain, unspecified: Secondary | ICD-10-CM

## 2017-01-03 DIAGNOSIS — E876 Hypokalemia: Secondary | ICD-10-CM | POA: Diagnosis present

## 2017-01-03 LAB — TROPONIN I

## 2017-01-03 LAB — ECHOCARDIOGRAM COMPLETE
Height: 63 in
Weight: 2632 oz

## 2017-01-03 LAB — BRAIN NATRIURETIC PEPTIDE: B Natriuretic Peptide: 37.3 pg/mL (ref 0.0–100.0)

## 2017-01-03 LAB — MAGNESIUM: Magnesium: 1.9 mg/dL (ref 1.7–2.4)

## 2017-01-03 MED ORDER — POTASSIUM CHLORIDE CRYS ER 10 MEQ PO TBCR
10.0000 meq | EXTENDED_RELEASE_TABLET | Freq: Every day | ORAL | Status: DC
  Administered 2017-01-03: 10 meq via ORAL
  Filled 2017-01-03: qty 1

## 2017-01-03 MED ORDER — POTASSIUM CHLORIDE CRYS ER 10 MEQ PO TBCR: EXTENDED_RELEASE_TABLET | ORAL | 0 refills | Status: DC

## 2017-01-03 MED ORDER — LOSARTAN POTASSIUM-HCTZ 100-25 MG PO TABS: 0.5000 | ORAL_TABLET | Freq: Every day | ORAL | 0 refills | Status: DC

## 2017-01-03 MED ORDER — DIAZEPAM 5 MG PO TABS: 5.0000 mg | ORAL_TABLET | Freq: Every evening | ORAL | Status: DC | PRN

## 2017-01-03 MED ORDER — ACETAMINOPHEN 325 MG PO TABS: 650.0000 mg | ORAL_TABLET | ORAL | Status: DC | PRN

## 2017-01-03 MED ORDER — AMLODIPINE BESYLATE 5 MG PO TABS
5.0000 mg | ORAL_TABLET | Freq: Every day | ORAL | Status: DC
  Administered 2017-01-03: 5 mg via ORAL
  Filled 2017-01-03: qty 1

## 2017-01-03 MED ORDER — APIXABAN 5 MG PO TABS
5.0000 mg | ORAL_TABLET | Freq: Two times a day (BID) | ORAL | Status: DC
  Administered 2017-01-03: 5 mg via ORAL
  Filled 2017-01-03: qty 1

## 2017-01-03 MED ORDER — LOSARTAN POTASSIUM 50 MG PO TABS
100.0000 mg | ORAL_TABLET | Freq: Every day | ORAL | Status: DC
  Administered 2017-01-03: 100 mg via ORAL
  Filled 2017-01-03 (×2): qty 2

## 2017-01-03 MED ORDER — OMEGA-3-ACID ETHYL ESTERS 1 G PO CAPS
1.0000 g | ORAL_CAPSULE | Freq: Every day | ORAL | Status: DC
  Administered 2017-01-03: 1 g via ORAL
  Filled 2017-01-03: qty 1

## 2017-01-03 MED ORDER — ONDANSETRON HCL 4 MG/2ML IJ SOLN: 4.0000 mg | Freq: Four times a day (QID) | INTRAMUSCULAR | Status: DC | PRN

## 2017-01-03 MED ORDER — PANTOPRAZOLE SODIUM 40 MG PO TBEC
40.0000 mg | DELAYED_RELEASE_TABLET | Freq: Every day | ORAL | Status: DC
  Administered 2017-01-03: 40 mg via ORAL
  Filled 2017-01-03: qty 1

## 2017-01-03 MED ORDER — APIXABAN 5 MG PO TABS
5.0000 mg | ORAL_TABLET | Freq: Once | ORAL | Status: AC
  Administered 2017-01-03: 5 mg via ORAL
  Filled 2017-01-03: qty 1

## 2017-01-03 MED ORDER — FERROUS SULFATE 325 (65 FE) MG PO TABS: 325.0000 mg | ORAL_TABLET | Freq: Two times a day (BID) | ORAL | 3 refills | Status: DC

## 2017-01-03 MED ORDER — ROSUVASTATIN CALCIUM 5 MG PO TABS
5.0000 mg | ORAL_TABLET | ORAL | Status: DC
  Filled 2017-01-03: qty 1

## 2017-01-03 MED ORDER — FERROUS SULFATE 325 (65 FE) MG PO TABS
325.0000 mg | ORAL_TABLET | Freq: Every day | ORAL | Status: DC
  Administered 2017-01-03: 325 mg via ORAL
  Filled 2017-01-03: qty 1

## 2017-01-03 MED ORDER — FOLIC ACID 1 MG PO TABS
500.0000 ug | ORAL_TABLET | Freq: Every day | ORAL | Status: DC
  Administered 2017-01-03: 0.5 mg via ORAL
  Filled 2017-01-03: qty 1

## 2017-01-03 MED ORDER — METOPROLOL SUCCINATE ER 100 MG PO TB24
100.0000 mg | ORAL_TABLET | Freq: Every day | ORAL | Status: DC
  Administered 2017-01-03: 100 mg via ORAL
  Filled 2017-01-03: qty 1

## 2017-01-03 NOTE — Progress Notes (Signed)
Dr. Marlou Porch discussed with ER MD yesterday "discussed with Dr. Marlou Porch of cardiology, who recommended patient stop Hyzaar, do losartan only, and follow up BMP with PCP in one week with suspicion that hypokalemia as contributing to her symptoms.  He recommended that if she has 2 negative troponins she moved discharge with outpatient follow-up."  troponins neg.  Will schedule outpt follow up.  Have allowed pt to eat.

## 2017-01-03 NOTE — Discharge Summary (Signed)
Amber [name], is a 71 y.o. female  DOB 1946-09-16  MRN [id number].  Admission date:  01/02/2017  Admitting Physician  Rise Patience, MD  Discharge Date:  01/03/2017   Primary MD  Precious Reel, MD  Recommendations for primary care physician for things to follow:   Repeat BMP in 3-4 days   Admission Diagnosis  Chest Pain   Discharge Diagnosis  Chest Pain   Principal Problem:   Chest tightness Active Problems:   HTN (hypertension)   Gastroesophageal reflux disease without esophagitis   Hypokalemia   Chest pain      Past Medical History:  Diagnosis Date  . Abdominal mass    pt unaware  . Arthritis   . Chest pain    + Tn s/p cath 2012--Normal  . History of tobacco abuse   . HTN (hypertension)   . Hyperlipemia   . IBS (irritable bowel syndrome)   . Obesity   . Osteoporosis   . Persistent atrial fibrillation (HCC)    three types  . Post-menopausal   . Right bundle branch block     Past Surgical History:  Procedure Laterality Date  . BREAST BIOPSY     left breast- benign  . BREAST REDUCTION SURGERY     BILATERAL  . CARDIAC CATHETERIZATION  02-25-2011   EF 60%. Left coronary artery arises and distributes normal, right coronary artery is a dominant vessel and is normal  . CARDIOVASCULAR STRESS TEST  08-27-2002   EF 76%  . CARPAL TUNNEL RELEASE    . ELECTROPHYSIOLOGIC STUDY N/A 03/21/2016   Procedure: Atrial Fibrillation Ablation;  Surgeon: Thompson Grayer, MD;  Location: [facility];  Service: Cardiovascular;  Laterality: N/A;  . LOOP RECORDER EXPLANT N/A 12/28/2014   Procedure: LOOP RECORDER EXPLANT;  Surgeon: Deboraha Sprang, MD  . OVARIAN CYST REMOVAL  1978  . TEE WITHOUT CARDIOVERSION N/A 03/20/2016   Procedure: TRANSESOPHAGEAL ECHOCARDIOGRAM (TEE);  Surgeon: Skeet Latch, MD;  Location: [facility];  Service: Cardiovascular;  Laterality: N/A;  . TONSILLECTOMY AND  ADENOIDECTOMY  AGE 78  . US ECHOCARDIOGRAPHY  12-23-2006   EF 55-60%       HPI  from the history and physical done on the day of admission:    Chief Complaint: Chest pain and shortness of breath.  HPI: Amber [name] is a 71 y.o. female with history of atrial fibrillation status post ablation, hypertension, hyperlipidemia presents to the ER because of increasing exertional shortness of breath. Patient also has been experiencing chest pain on lying down. The symptoms have been ongoing for last 3 weeks. Patient also gets tachycardic on exertion. EKG shows sinus tachycardia. CT angiogram of the chest was negative for PE. Patient will be admitted for further observation. Patient has had recent surgery for low back 3 months ago. Patient is presently chest pain-free. Chest pain is retrosternal burning or sometimes pressure-like nonradiating last for a few minutes each time.   ED Course: CT angiogram of the chest was negative for PE. EKG  was showing sinus tachycardia. Patient also had hypokalemia which has been replaced.  Review of Systems: As per HPI, rest all negative     Hospital Course:   1)Chest pain and exertional shortness of breath - chest pain appears atypical and only happens on lying down. No signs of pericarditis. Patient has had a normal cardiac cath in 2012. CT angiogram was negative for PE this time. Patient has ruled out for ACS by cardiac enzymes and EKG . Echocardiogram with preserved EF over 60%, no significant regional wall motion abnormalities TSH is normal. Please see note from cardiology service dated 01/03/2017, cardiology service recommends discharge home with outpatient follow-up with cardiology   2)Hypertension - decrease losartan HCTZ to half a tablet a day due to concerns about HCTZ-induced hypokalemia, c/n  amlodipine and metoprolol. Patient is reluctant to go off HCTZ altogether as recommended by cardiology service  3)Atrial fibrillation status post prior ablation  on Apixaban and metoprolol.  4)Hypokalemia - replaced, suspect secondary to HCTZ use, see discussions above in #2 , repeat BMP with PCP in 3-4 days   DVT prophylaxis: Apixaban. Code Status: Full code.  Family Communication: Discussed with patient.  Disposition Plan: Home.  Consults called:  please see note from cardiology service Admission status: Observation.   Discharge Condition: Stable  Follow UP  Follow-up Information    Thompson Grayer, MD Follow up.   Specialty:  Cardiology Why:  the office will call with follow up appt.  with Dr. Rayann Heman or a PA or NP.l  Contact information: [address] [place] [address] [phone]          Diet and Activity recommendation:  As advised  Discharge Instructions     Discharge Instructions    Call MD for:  difficulty breathing, headache or visual disturbances    Complete by:  As directed    Call MD for:  persistant dizziness or light-headedness    Complete by:  As directed    Call MD for:  temperature >100.4    Complete by:  As directed    Diet - low sodium heart healthy    Complete by:  As directed    Discharge instructions    Complete by:  As directed    1)Take medications as prescribed, please take note of the many changes to your medication regimen 2)Follow-up with your primary care physician for repeat basic metabolic profile test ( check potassium) on Monday the 22nd of January   Increase activity slowly    Complete by:  As directed         Discharge Medications     Allergies as of 01/03/2017      Reactions   Ciprofloxacin Itching, Swelling      Medication List    STOP taking these medications   spironolactone 25 MG tablet Commonly known as:  ALDACTONE     TAKE these medications   acetaminophen 500 MG tablet Commonly known as:  TYLENOL Take 500 mg by mouth daily as needed for mild pain or headache.   AIRBORNE Lozg Take 1 lozenge by mouth 4 (four) times daily as needed (for immune  health).   amLODipine 5 MG tablet Commonly known as:  NORVASC Take 1 tablet (5 mg total) by mouth daily.   diazepam 10 MG tablet Commonly known as:  VALIUM Take 5 mg by mouth at bedtime as needed for sleep.   ELIQUIS 5 MG Tabs tablet Generic drug:  apixaban TAKE 1 TABLET BY MOUTH TWICE  DAILY   esomeprazole 40 MG capsule Commonly known as:  NEXIUM Take 40 mg by mouth daily as needed (heartburn).   ferrous sulfate 325 (65 FE) MG tablet Take 1 tablet (325 mg total) by mouth 2 (two) times daily with a meal. What changed:  when to take this   fish oil-omega-3 fatty acids 1000 MG capsule Take 1 g by mouth daily with lunch.   folic acid A999333 MCG tablet Commonly known as:  FOLVITE Take 400 mcg by mouth daily.   loperamide 2 MG tablet Commonly known as:  IMODIUM A-D Take 2 mg by mouth 4 (four) times daily as needed for diarrhea or loose stools.   losartan-hydrochlorothiazide 100-25 MG tablet Commonly known as:  HYZAAR Take 0.5 tablets by mouth daily. Take Losartan/HCTZ (50/12.5 mg) every morning What changed:  how much to take  additional instructions   metoprolol succinate 100 MG 24 hr tablet Commonly known as:  TOPROL-XL Take 100 mg by mouth every morning. Take with or immediately following a meal.   potassium chloride 10 MEQ tablet Commonly known as:  K-DUR,KLOR-CON Take 2 tablets (20 meq)  daily for 4 days and then 1 tablet daily indefinitely What changed:  how much to take  how to take this  when to take this  additional instructions   rosuvastatin 5 MG tablet Commonly known as:  CRESTOR Take 5 mg by mouth every other day.   VITAMIN D PO Take 1 tablet by mouth daily.     Major procedures and Radiology Reports - PLEASE review detailed and final reports for all details, in brief -   Dg Chest 2 View  Result Date: 01/02/2017 CLINICAL DATA:  afib and anemic for few days,,,gets weak when standing EXAM: CHEST  2 VIEW COMPARISON:  None. FINDINGS: The heart  size and mediastinal contours are within normal limits. Both lungs are clear. The visualized skeletal structures are unremarkable. Prominent rugal folds within the stomach. Cannot exclude followup point lesions. IMPRESSION: 1.  No acute cardiopulmonary process. 2. Prominent rugal folds.  Cannot exclude polyps within stomach. Electronically Signed   By: Suzy Bouchard M.D.   On: 01/02/2017 13:59   Ct Angio Chest Pe W And/or Wo Contrast  Result Date: 01/02/2017 CLINICAL DATA:  Chest tightness for weeks which worsened last night. EXAM: CT ANGIOGRAPHY CHEST WITH CONTRAST TECHNIQUE: Multidetector CT imaging of the chest was performed using the standard protocol during bolus administration of intravenous contrast. Multiplanar CT image reconstructions and MIPs were obtained to evaluate the vascular anatomy. CONTRAST:  100 cc Isovue 370. COMPARISON:  PA and lateral chest earlier today. Single-view of the chest 02/15/2011. FINDINGS: Cardiovascular: No pulmonary embolus is identified. Heart size is normal. No calcific coronary artery disease is identified. A few calcific atherosclerotic calcifications of the aorta are seen. 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 Mediastinum/Nodes: Small hiatal hernia is seen. No lymphadenopathy. Thyroid gland appears normal. Lungs/Pleura: No pleural effusion.  Lungs are clear. Upper Abdomen: Negative. Musculoskeletal: No acute bony abnormality. Review of the MIP images confirms the above findings. IMPRESSION: Negative for pulmonary embolus.  No acute disease. Mild aortic atherosclerosis. Small hiatal hernia. Electronically Signed   By: Inge Rise M.D.   On: 01/02/2017 20:32    Micro Results   No results found for this or any previous visit (from the past 240 hour(s)).   Today   Subjective    Amber [name] today has no chest pain,  husband at bedside, questions answered  Patient has been seen and examined prior to discharge   Objective   Blood  pressure (!) 101/51, pulse 77, temperature 98 F (36.7 C), temperature source Oral, resp. rate 19, height 5\' 3"  (1.6 m), weight 74.6 kg (164 lb 8 oz), SpO2 100 %.   Intake/Output Summary (Last 24 hours) at 01/03/17 1612 Last data filed at 01/03/17 1500  Gross per 24 hour  Intake              360 ml  Output             1150 ml  Net             -790 ml   Exam Gen:- Awake  In no apparent distress  HEENT:- [place].AT,   Neck-Supple Neck,No JVD,  Lungs- mostly clear  CV- S1, S2 normal Abd-  +ve B.Sounds, Abd Soft, No tenderness,    Extremity/Skin:- Intact peripheral pulses   Back- pain with ROM, uses cane   Data Review   CBC w Diff:  Lab Results  Component Value Date   WBC 5.7 01/02/2017   HGB 10.6 (L) 01/02/2017   HCT 33.9 (L) 01/02/2017   PLT 458 (H) 01/02/2017   LYMPHOPCT 18 03/15/2016   MONOPCT 8 03/15/2016   EOSPCT 1 03/15/2016   BASOPCT 0 03/15/2016    CMP:  Lab Results  Component Value Date   NA 131 (L) 01/02/2017   K 2.8 (L) 01/02/2017   CL 95 (L) 01/02/2017   CO2 23 01/02/2017   BUN 9 01/02/2017   CREATININE 0.62 01/02/2017   CREATININE 0.77 10/04/2016   PROT 7.5 04/15/2012   ALBUMIN 4.7 04/15/2012   BILITOT 1.0 04/15/2012   ALKPHOS 61 04/15/2012   AST 24 04/15/2012   ALT 19 04/15/2012  .   Total Discharge time is about 33 minutes  Abbagayle Zaragoza M.D on 01/03/2017 at [place] PM  [facility]   Office  [phone]  Dragon dictation system was used to create this note, attempts have been made to correct errors, however presence of uncorrected errors is not a reflection quality of care provided

## 2017-01-03 NOTE — Discharge Instructions (Addendum)

## 2017-01-03 NOTE — Care Management Obs Status (Signed)
[facility] NOTIFICATION   Patient Details  Name: LASHEKA BOGDON MRN: [id number] Date of Birth: Jul 05, 1946   Medicare Observation Status Notification Given:  Yes    Bethena Roys, RN 01/03/2017, 4:20 PM

## 2017-01-03 NOTE — Progress Notes (Signed)
Discharge instructions reviewed with patient. Patient has no questions at this time. IV d/c. Pt being discharged home with husband.

## 2017-01-03 NOTE — H&P (Signed)
History and Physical    Amber [name] [id number] DOB: 11/11/1946 DOA: 01/02/2017  PCP: Precious Reel, MD  Patient coming from: Home.  Chief Complaint: Chest pain and shortness of breath.  HPI: Amber [name] is a 71 y.o. female with history of atrial fibrillation status post ablation, hypertension, hyperlipidemia presents to the ER because of increasing exertional shortness of breath. Patient also has been experiencing chest pain on lying down. The symptoms have been ongoing for last 3 weeks. Patient also gets tachycardic on exertion. EKG shows sinus tachycardia. CT angiogram of the chest was negative for PE. Patient will be admitted for further observation. Patient has had recent surgery for low back 3 months ago. Patient is presently chest pain-free. Chest pain is retrosternal burning or sometimes pressure-like nonradiating last for a few minutes each time.   ED Course: CT angiogram of the chest was negative for PE. EKG was showing sinus tachycardia. Patient also had hypokalemia which has been replaced.  Review of Systems: As per HPI, rest all negative.   Past Medical History:  Diagnosis Date  . Abdominal mass    pt unaware  . Arthritis   . Chest pain    + Tn s/p cath 2012--Normal  . History of tobacco abuse   . HTN (hypertension)   . Hyperlipemia   . IBS (irritable bowel syndrome)   . Obesity   . Osteoporosis   . Persistent atrial fibrillation (HCC)    three types  . Post-menopausal   . Right bundle branch block     Past Surgical History:  Procedure Laterality Date  . BREAST BIOPSY     left breast- benign  . BREAST REDUCTION SURGERY     BILATERAL  . CARDIAC CATHETERIZATION  02-25-2011   EF 60%. Left coronary artery arises and distributes normal, right coronary artery is a dominant vessel and is normal  . CARDIOVASCULAR STRESS TEST  08-27-2002   EF 76%  . CARPAL TUNNEL RELEASE    . ELECTROPHYSIOLOGIC STUDY N/A 03/21/2016   Procedure: Atrial Fibrillation  Ablation;  Surgeon: Thompson Grayer, MD;  Location: [facility];  Service: Cardiovascular;  Laterality: N/A;  . LOOP RECORDER EXPLANT N/A 12/28/2014   Procedure: LOOP RECORDER EXPLANT;  Surgeon: Deboraha Sprang, MD  . OVARIAN CYST REMOVAL  1978  . TEE WITHOUT CARDIOVERSION N/A 03/20/2016   Procedure: TRANSESOPHAGEAL ECHOCARDIOGRAM (TEE);  Surgeon: Skeet Latch, MD;  Location: [facility];  Service: Cardiovascular;  Laterality: N/A;  . TONSILLECTOMY AND ADENOIDECTOMY  AGE 71  . US ECHOCARDIOGRAPHY  12-23-2006   EF 55-60%     reports that she quit smoking about 48 years ago. Her smoking use included Cigarettes. She started smoking about 49 years ago. She quit after 1.00 year of use. She has never used smokeless tobacco. She reports that she drinks about 4.2 oz of alcohol per week . She reports that she does not use drugs.  Allergies  Allergen Reactions  . Ciprofloxacin Itching and Swelling    Family History  Problem Relation Age of Onset  . Prostate cancer Father   . Heart failure Father   . Diabetes Father   . Atrial fibrillation Mother     Prior to Admission medications   Medication Sig Start Date End Date Taking? Authorizing Provider  acetaminophen (TYLENOL) 500 MG tablet Take 500 mg by mouth daily as needed for mild pain or headache.   Yes Historical Provider, MD  amLODipine (NORVASC) 5 MG tablet Take 1 tablet (5 mg  total) by mouth daily. 06/24/16  Yes Thompson Grayer, MD  Cholecalciferol (VITAMIN D PO) Take 1 tablet by mouth daily.    Yes Historical Provider, MD  diazepam (VALIUM) 10 MG tablet Take 5 mg by mouth at bedtime as needed for sleep. 10/21/16  Yes Historical Provider, MD  ELIQUIS 5 MG TABS tablet TAKE 1 TABLET BY MOUTH TWICE DAILY 07/18/16  Yes Deboraha Sprang, MD  esomeprazole (NEXIUM) 40 MG capsule Take 40 mg by mouth daily as needed (heartburn).    Yes Thayer Headings, MD  ferrous sulfate 325 (65 FE) MG tablet Take 325 mg by mouth daily with lunch.   Yes Historical  Provider, MD  fish oil-omega-3 fatty acids 1000 MG capsule Take 1 g by mouth daily with lunch.    Yes Historical Provider, MD  folic acid (FOLVITE) A999333 MCG tablet Take 400 mcg by mouth daily.   Yes Historical Provider, MD  loperamide (IMODIUM A-D) 2 MG tablet Take 2 mg by mouth 4 (four) times daily as needed for diarrhea or loose stools.   Yes Historical Provider, MD  losartan-hydrochlorothiazide (HYZAAR) 100-25 MG tablet TAKE 1 TABLET BY MOUTH DAILY 11/25/16  Yes Deboraha Sprang, MD  metoprolol succinate (TOPROL-XL) 100 MG 24 hr tablet Take 100 mg by mouth every morning. Take with or immediately following a meal.   Yes Historical Provider, MD  Multiple Vitamins-Minerals (AIRBORNE) LOZG Take 1 lozenge by mouth 4 (four) times daily as needed (for immune health).    Yes Historical Provider, MD  potassium chloride (K-DUR,KLOR-CON) 10 MEQ tablet Take 10 mEq by mouth daily. 12/30/16  Yes Historical Provider, MD  rosuvastatin (CRESTOR) 5 MG tablet Take 5 mg by mouth every other day.   Yes Historical Provider, MD  spironolactone (ALDACTONE) 25 MG tablet Take 1 tablet (25 mg total) by mouth daily. Patient not taking: Reported on 12/25/2016 09/26/16 12/25/16  Thompson Grayer, MD    Physical Exam: Vitals:   01/02/17 2215 01/02/17 2230 01/02/17 2245 01/02/17 2325  BP: 139/74 117/91 129/81 (!) 154/68  Pulse: 104 104 88 (!) 101  Resp: 11 16 21    Temp:    98 F (36.7 C)  TempSrc:    Oral  SpO2: 98% 100% 99% 100%  Weight:    75.4 kg (166 lb 3.2 oz)  Height:    5\' 3"  (1.6 m)      Constitutional: Moderately built and nourished. Vitals:   01/02/17 2215 01/02/17 2230 01/02/17 2245 01/02/17 2325  BP: 139/74 117/91 129/81 (!) 154/68  Pulse: 104 104 88 (!) 101  Resp: 11 16 21    Temp:    98 F (36.7 C)  TempSrc:    Oral  SpO2: 98% 100% 99% 100%  Weight:    75.4 kg (166 lb 3.2 oz)  Height:    5\' 3"  (1.6 m)   Eyes: Anicteric no pallor. ENMT: No discharge from the ears eyes nose or mouth. Neck: No mass  felt. No JVD appreciated. Respiratory: No rhonchi or crepitations. Cardiovascular: S1-S2 heard no murmurs appreciated. Abdomen: Soft nontender bowel sounds present. Musculoskeletal: No edema. No joint effusion. Skin: No rash. Skin appears warm. Neurologic: Alert awake oriented to time place and person. Moves all extremities. Psychiatric: Appears normal. Normal affect.   Labs on Admission: I have personally reviewed following labs and imaging studies  CBC:  Recent Labs Lab 01/02/17 1332  WBC 5.7  HGB 10.6*  HCT 33.9*  MCV 79.0  PLT [id number]*   Basic Metabolic Panel:  Recent Labs Lab 01/02/17 1332 01/02/17 1832  NA 131*  --   K 2.8*  --   CL 95*  --   CO2 23  --   GLUCOSE 151*  --   BUN 9  --   CREATININE 0.62  --   CALCIUM 10.1  --   MG  --  1.9   GFR: Estimated Creatinine Clearance: 62.7 mL/min (by C-G formula based on SCr of 0.62 mg/dL). Liver Function Tests: No results for input(s): AST, ALT, ALKPHOS, BILITOT, PROT, ALBUMIN in the last 168 hours. No results for input(s): LIPASE, AMYLASE in the last 168 hours. No results for input(s): AMMONIA in the last 168 hours. Coagulation Profile: No results for input(s): INR, PROTIME in the last 168 hours. Cardiac Enzymes:  Recent Labs Lab 01/02/17 1832  TROPONINI <0.03   BNP (last 3 results) No results for input(s): PROBNP in the last 8760 hours. HbA1C: No results for input(s): HGBA1C in the last 72 hours. CBG: No results for input(s): GLUCAP in the last 168 hours. Lipid Profile: No results for input(s): CHOL, HDL, LDLCALC, TRIG, CHOLHDL, LDLDIRECT in the last 72 hours. Thyroid Function Tests:  Recent Labs  01/02/17 1832  TSH 3.176   Anemia Panel: No results for input(s): VITAMINB12, FOLATE, FERRITIN, TIBC, IRON, RETICCTPCT in the last 72 hours. Urine analysis: No results found for: COLORURINE, APPEARANCEUR, LABSPEC, PHURINE, GLUCOSEU, HGBUR, BILIRUBINUR, KETONESUR, PROTEINUR, UROBILINOGEN, NITRITE,  LEUKOCYTESUR Sepsis Labs: @LABRCNTIP (procalcitonin:4,lacticidven:4) )No results found for this or any previous visit (from the past 240 hour(s)).   Radiological Exams on Admission: Dg Chest 2 View  Result Date: 01/02/2017 CLINICAL DATA:  afib and anemic for few days,,,gets weak when standing EXAM: CHEST  2 VIEW COMPARISON:  None. FINDINGS: The heart size and mediastinal contours are within normal limits. Both lungs are clear. The visualized skeletal structures are unremarkable. Prominent rugal folds within the stomach. Cannot exclude followup point lesions. IMPRESSION: 1.  No acute cardiopulmonary process. 2. Prominent rugal folds.  Cannot exclude polyps within stomach. Electronically Signed   By: Suzy Bouchard M.D.   On: 01/02/2017 13:59   Ct Angio Chest Pe W And/or Wo Contrast  Result Date: 01/02/2017 CLINICAL DATA:  Chest tightness for weeks which worsened last night. EXAM: CT ANGIOGRAPHY CHEST WITH CONTRAST TECHNIQUE: Multidetector CT imaging of the chest was performed using the standard protocol during bolus administration of intravenous contrast. Multiplanar CT image reconstructions and MIPs were obtained to evaluate the vascular anatomy. CONTRAST:  100 cc Isovue 370. COMPARISON:  PA and lateral chest earlier today. Single-view of the chest 02/15/2011. FINDINGS: Cardiovascular: No pulmonary embolus is identified. Heart size is normal. No calcific coronary artery disease is identified. A few calcific atherosclerotic calcifications of the aorta are seen. 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 Mediastinum/Nodes: Small hiatal hernia is seen. No lymphadenopathy. Thyroid gland appears normal. Lungs/Pleura: No pleural effusion.  Lungs are clear. Upper Abdomen: Negative. Musculoskeletal: No acute bony abnormality. Review of the MIP images confirms the above findings. IMPRESSION: Negative for pulmonary embolus.  No acute disease. Mild aortic atherosclerosis. Small hiatal hernia. Electronically  Signed   By: Inge Rise M.D.   On: 01/02/2017 20:32    EKG: Independently reviewed. Sinus tachycardia with RBBB.  Assessment/Plan Principal Problem:   Chest tightness Active Problems:   HTN (hypertension)   Gastroesophageal reflux disease without esophagitis   Hypokalemia   Chest pain    1. Chest pain and exertional shortness of breath - chest pain appears atypical and only  happens on lying down. No signs of pericarditis. Patient has had a normal cardiac cath in 2012. CT angiogram was negative for PE this time. We'll cycle cardiac markers check 2-D echo. May consult cardiology in a.m. TSH is normal. 2. Hypertension on Cozaar and amlodipine and metoprolol. Will hold hydrochlorothiazide since patient is receiving gentle hydration. 3. Atrial fibrillation status post ablation on Apixaban metoprolol. 4. Hypokalemia - replace and recheck. Check magnesium levels.   DVT prophylaxis: Apixaban. Code Status: Full code.  Family Communication: Discussed with patient.  Disposition Plan: Home.  Consults called: None.  Admission status: Observation.    Rise Patience MD [facility] Pager [phone].  If 7PM-7AM, please contact night-coverage [email or web address] Password [email or web address]  01/03/2017, 12:08 AM

## 2017-01-06 DIAGNOSIS — Z1212 Encounter for screening for malignant neoplasm of rectum: Secondary | ICD-10-CM | POA: Diagnosis not present

## 2017-01-06 LAB — HEMOCCULT GUIAC POC 1CARD (OFFICE)

## 2017-01-09 ENCOUNTER — Other Ambulatory Visit (HOSPITAL_COMMUNITY): Payer: Medicare Other

## 2017-01-09 ENCOUNTER — Encounter: Payer: Self-pay | Admitting: *Deleted

## 2017-01-09 ENCOUNTER — Ambulatory Visit (HOSPITAL_COMMUNITY)
Admission: RE | Admit: 2017-01-09 | Discharge: 2017-01-09 | Disposition: A | Discharge status: Home / Self Care | Payer: Medicare Other | Source: Ambulatory Visit | Attending: Nurse Practitioner | Admitting: Nurse Practitioner

## 2017-01-09 VITALS — BP 162/80 | HR 83 | Ht 63.0 in | Wt 169.0 lb

## 2017-01-09 DIAGNOSIS — Z87891 Personal history of nicotine dependence: Secondary | ICD-10-CM | POA: Diagnosis not present

## 2017-01-09 DIAGNOSIS — I10 Essential (primary) hypertension: Secondary | ICD-10-CM | POA: Diagnosis not present

## 2017-01-09 DIAGNOSIS — Z79899 Other long term (current) drug therapy: Secondary | ICD-10-CM | POA: Insufficient documentation

## 2017-01-09 DIAGNOSIS — M810 Age-related osteoporosis without current pathological fracture: Secondary | ICD-10-CM | POA: Insufficient documentation

## 2017-01-09 DIAGNOSIS — I471 Supraventricular tachycardia: Secondary | ICD-10-CM | POA: Diagnosis not present

## 2017-01-09 DIAGNOSIS — I481 Persistent atrial fibrillation: Secondary | ICD-10-CM | POA: Diagnosis not present

## 2017-01-09 DIAGNOSIS — I4510 Unspecified right bundle-branch block: Secondary | ICD-10-CM | POA: Insufficient documentation

## 2017-01-09 DIAGNOSIS — Z9889 Other specified postprocedural states: Secondary | ICD-10-CM | POA: Diagnosis not present

## 2017-01-09 DIAGNOSIS — D649 Anemia, unspecified: Secondary | ICD-10-CM | POA: Diagnosis not present

## 2017-01-09 MED ORDER — METOPROLOL SUCCINATE ER 25 MG PO TB24: 12.5000 mg | ORAL_TABLET | Freq: Every day | ORAL | 6 refills | Status: DC

## 2017-01-09 MED ORDER — LOSARTAN POTASSIUM-HCTZ 100-12.5 MG PO TABS: 1.0000 | ORAL_TABLET | Freq: Every day | ORAL | 6 refills | Status: DC

## 2017-01-09 NOTE — Patient Instructions (Signed)
Your physician has recommended you make the following change in your medication:  1)Losartan/HCTZ 100/12.5 - once a day 2)Increase metoprolol to 12.5mg  in the evening along with 100mg  dose in the morning

## 2017-01-10 NOTE — Progress Notes (Signed)
Patient ID: Amber [name], female   DOB: Mar 11, 1946, 71 y.o.   MRN: [id number]    Primary Care Physician: Precious Reel, MD Referring Physician: Dr. Levon Hedger is a 71 y.o. female with a h/o afib with PVI per Dr. Rayann Heman 03/21/16. She had failed fecanide and was placed on amiodarone 200 mg  prior to ablation which was stopped after successful procedure and no further afib. She was in [facility] for tachycardia and chest pressure which did not show any acute issues. She was diagnosed with anemia a few months ago and has had more issues with tachycardia and shortness of breath with exertion. Initially HGB was at 9 and most recently improved to 10.6. She feels that she has an elervation of heart rate in the am and does much better in the last am and pm. Her BP combo pill, losartan/hctz was cut in half for a low Kt in the hospital, but BP has been elevated at home. Pt feels that she needs the full amount of losartan.  Today, she denies symptoms of  chest pain, shortness of breath, orthopnea, PND, lower extremity edema, dizziness, presyncope, syncope, or neurologic sequela. Positive for fast heart beat at times with dyspnea. The patient is tolerating medications without difficulties and is otherwise without complaint today.   Past Medical History:  Diagnosis Date  . Abdominal mass    pt unaware  . Arthritis   . Chest pain    + Tn s/p cath 2012--Normal  . History of tobacco abuse   . HTN (hypertension)   . Hyperlipemia   . IBS (irritable bowel syndrome)   . Obesity   . Osteoporosis   . Persistent atrial fibrillation (HCC)    three types  . Post-menopausal   . Right bundle branch block    Past Surgical History:  Procedure Laterality Date  . BREAST BIOPSY     left breast- benign  . BREAST REDUCTION SURGERY     BILATERAL  . CARDIAC CATHETERIZATION  02-25-2011   EF 60%. Left coronary artery arises and distributes normal, right coronary artery is a dominant vessel and is normal  .  CARDIOVASCULAR STRESS TEST  08-27-2002   EF 76%  . CARPAL TUNNEL RELEASE    . ELECTROPHYSIOLOGIC STUDY N/A 03/21/2016   Procedure: Atrial Fibrillation Ablation;  Surgeon: Thompson Grayer, MD;  Location: [place] [facility];  Service: Cardiovascular;  Laterality: N/A;  . LOOP RECORDER EXPLANT N/A 12/28/2014   Procedure: LOOP RECORDER EXPLANT;  Surgeon: Deboraha Sprang, MD  . OVARIAN CYST REMOVAL  1978  . TEE WITHOUT CARDIOVERSION N/A 03/20/2016   Procedure: TRANSESOPHAGEAL ECHOCARDIOGRAM (TEE);  Surgeon: Skeet Latch, MD;  Location: [place];  Service: Cardiovascular;  Laterality: N/A;  . TONSILLECTOMY AND ADENOIDECTOMY  AGE 66  . US ECHOCARDIOGRAPHY  12-23-2006   EF 55-60%    Current Outpatient Prescriptions  Medication Sig Dispense Refill  . acetaminophen (TYLENOL) 500 MG tablet Take 500 mg by mouth daily as needed for mild pain or headache.    Marland Kitchen amLODipine (NORVASC) 5 MG tablet Take 1 tablet (5 mg total) by mouth daily. 180 tablet 3  . diazepam (VALIUM) 10 MG tablet Take 5 mg by mouth at bedtime as needed for sleep.    Marland Kitchen ELIQUIS 5 MG TABS tablet TAKE 1 TABLET BY MOUTH TWICE DAILY 180 tablet 3  . esomeprazole (NEXIUM) 40 MG capsule Take 40 mg by mouth daily as needed (heartburn).     . ferrous sulfate 325 (  65 FE) MG tablet Take 1 tablet (325 mg total) by mouth 2 (two) times daily with a meal. 60 tablet 3  . fish oil-omega-3 fatty acids 1000 MG capsule Take 1 g by mouth daily with lunch.     . folic acid (FOLVITE) A999333 MCG tablet Take 400 mcg by mouth daily.    Marland Kitchen loperamide (IMODIUM A-D) 2 MG tablet Take 2 mg by mouth 4 (four) times daily as needed for diarrhea or loose stools.    . metoprolol succinate (TOPROL-XL) 100 MG 24 hr tablet Take 100 mg by mouth every morning. Take with or immediately following a meal.    . Multiple Vitamins-Minerals (AIRBORNE) LOZG Take 1 lozenge by mouth 4 (four) times daily as needed (for immune health).     . potassium chloride (K-DUR,KLOR-CON) 10 MEQ tablet Take 2  tablets (20 meq)  daily for 4 days and then 1 tablet daily indefinitely 35 tablet 0  . rosuvastatin (CRESTOR) 5 MG tablet Take 5 mg by mouth every other day.    . Cholecalciferol (VITAMIN D PO) Take 1 tablet by mouth daily.     Marland Kitchen losartan-hydrochlorothiazide (HYZAAR) 100-12.5 MG tablet Take 1 tablet by mouth daily. 30 tablet 6  . metoprolol succinate (TOPROL-XL) 25 MG 24 hr tablet Take 0.5 tablets (12.5 mg total) by mouth at bedtime. 15 tablet 6   No current facility-administered medications for this encounter.     Allergies  Allergen Reactions  . Ciprofloxacin Itching and Swelling    Social History   Social History  . Marital status: Married    Spouse name: N/A  . Number of children: N/A  . Years of education: N/A   Occupational History  . Not on file.   Social History Main Topics  . Smoking status: Former Smoker    Years: 1.00    Types: Cigarettes    Start date: 12/17/1967    Quit date: 12/16/1968  . Smokeless tobacco: Never Used     Comment: Only smoked about 2 packs a month  . Alcohol use 4.2 oz/week    7 Standard drinks or equivalent per week     Comment: 1-2 wine or burboun per day  . Drug use: No  . Sexual activity: Not on file   Other Topics Concern  . Not on file   Social History Narrative   Pt lives in [place] with spouse.  Retired Chief Technology Officer.    Family History  Problem Relation Age of Onset  . Prostate cancer Father   . Heart failure Father   . Diabetes Father   . Atrial fibrillation Mother     ROS- All systems are reviewed and negative except as per the HPI above  Physical Exam: Vitals:   01/09/17 1403  BP: (!) 162/80  Pulse: 83  Weight: 169 lb (76.7 kg)  Height: 5\' 3"  (1.6 m)    GEN- The patient is well appearing, alert and oriented x 3 today.   Head- normocephalic, atraumatic Eyes-  Sclera clear, conjunctiva pink Ears- hearing intact Oropharynx- clear Neck- supple, no JVP Lymph- no cervical lymphadenopathy Lungs-  Clear to ausculation bilaterally, normal work of breathing Heart- Regular rate and rhythm, no murmurs, rubs or gallops, PMI not laterally displaced GI- soft, NT, ND, + BS Extremities- no clubbing, cyanosis, or edema MS- no significant deformity or atrophy Skin- no rash or lesion Psych- euthymic mood, full affect Neuro- strength and sensation are intact  EKG- NSR pr int 83 ms, pr int 158 ms,  qrs int 96 ms, qtc 432 ms Epic records  EKG showed Sinus tach at 117 bpm  Assessment and Plan: 1. Afib  Recent issues with elevated heart rate, esp in am Continue toprol at 100 mg in am and add 25 mg 1/2 tab in pm Continue eliquis  2. HTN Poorly managed  Will change to losartan 100 mg am and hctz 25 mg 1/2 tab in am Increase of BB may help as well  3. Anemia Has improved on po iron Per PCP, has scheduled colonoscopy next Friday  Will f/u in one week, if continues to have issues with HR will place a monitor     Butch Penny C. Oreste Majeed, [facility] [address] [place], [place] [address] [phone]

## 2017-01-14 ENCOUNTER — Ambulatory Visit: Payer: Medicare Other | Admitting: Physical Therapy

## 2017-01-14 ENCOUNTER — Ambulatory Visit: Payer: Medicare Other | Admitting: Nurse Practitioner

## 2017-01-14 VITALS — BP 139/69 | HR 98

## 2017-01-14 DIAGNOSIS — R293 Abnormal posture: Secondary | ICD-10-CM | POA: Diagnosis not present

## 2017-01-14 DIAGNOSIS — M545 Low back pain, unspecified: Secondary | ICD-10-CM

## 2017-01-14 DIAGNOSIS — R262 Difficulty in walking, not elsewhere classified: Secondary | ICD-10-CM

## 2017-01-14 DIAGNOSIS — M6281 Muscle weakness (generalized): Secondary | ICD-10-CM | POA: Diagnosis not present

## 2017-01-14 NOTE — Therapy (Signed)
[place], [place], [address] Phone: [phone]   Fax:  [phone]  Physical Therapy Treatment/ Recertification Revised  Patient Details  Name: Amber [name] MRN: [id number] Date of Birth: Jan 31, 1946 Referring Provider: Jenne Campus MD  Encounter Date: 01/14/2017      PT End of Session - 01/14/17 1016    Visit Number 10   Number of Visits 24   Date for PT Re-Evaluation 02/18/17   Authorization Type [employer] (extended 2 x /week for 5 weeks)   Authorization Time Period 02/18/17   PT Start Time 1015   PT Stop Time 1100   PT Time Calculation (min) 45 min   Activity Tolerance Patient tolerated treatment well   Behavior During Therapy [facility] for tasks assessed/performed      Past Medical History:  Diagnosis Date  . Abdominal mass    pt unaware  . Arthritis   . Chest pain    + Tn s/p cath 2012--Normal  . History of tobacco abuse   . HTN (hypertension)   . Hyperlipemia   . IBS (irritable bowel syndrome)   . Obesity   . Osteoporosis   . Persistent atrial fibrillation (HCC)    three types  . Post-menopausal   . Right bundle branch block     Past Surgical History:  Procedure Laterality Date  . BREAST BIOPSY     left breast- benign  . BREAST REDUCTION SURGERY     BILATERAL  . CARDIAC CATHETERIZATION  02-25-2011   EF 60%. Left coronary artery arises and distributes normal, right coronary artery is a dominant vessel and is normal  . CARDIOVASCULAR STRESS TEST  08-27-2002   EF 76%  . CARPAL TUNNEL RELEASE    . ELECTROPHYSIOLOGIC STUDY N/A 03/21/2016   Procedure: Atrial Fibrillation Ablation;  Surgeon: Thompson Grayer, MD;  Location: [place] [facility];  Service: Cardiovascular;  Laterality: N/A;  . LOOP RECORDER EXPLANT N/A 12/28/2014   Procedure: LOOP RECORDER EXPLANT;  Surgeon: Deboraha Sprang, MD  . OVARIAN CYST REMOVAL  1978  . TEE WITHOUT CARDIOVERSION N/A 03/20/2016   Procedure:  TRANSESOPHAGEAL ECHOCARDIOGRAM (TEE);  Surgeon: Skeet Latch, MD;  Location: [place];  Service: Cardiovascular;  Laterality: N/A;  . TONSILLECTOMY AND ADENOIDECTOMY  AGE 14  . US ECHOCARDIOGRAPHY  12-23-2006   EF 55-60%    Vitals:   01/14/17 1133  BP: 139/69  Pulse: 98            [facility] PT Assessment - 01/14/17 1100      Observation/Other Assessments   Observations BP  139/ 69   98 pulse after minimal exercises on 2 BP meds   Focus on Therapeutic Outcomes (FOTO)  FOTO intake53% limtation 47%, predictred 56%  taken on 12-26-16 clinical judgement same     AROM   Overall AROM  Deficits   Lumbar Flexion 60   Lumbar Extension 15  ERP   Lumbar - Right Side Bend 20  ERP   Lumbar - Left Side Bend 20  ERP   Lumbar - Right Rotation 75%   Lumbar - Left Rotation 75%     Strength   Overall Strength Deficits   Right Hip Flexion 4/5   Right Hip Extension 4/5   Right Hip ABduction 4-/5   Left Hip Flexion 4-/5   Left Hip Extension 4-/5   Left Hip ABduction 4-/5   Right Knee Flexion 4/5   Right Knee Extension 4+/5   Left  Knee Flexion 4-/5   Left Knee Extension 4-/5   Right Ankle Dorsiflexion 4/5   Left Ankle Dorsiflexion 4-/5                     [facility] Adult PT Treatment/Exercise - 01/14/17 1037      Lumbar Exercises: Seated   Other Seated Lumbar Exercises Abd engagement by pressing down on foam roller with bil UE x 15     Lumbar Exercises: Supine   Other Supine Lumbar Exercises on pink foam roller UE right /then left flexion x 15, bent knee bend 15 x right and left,    dead bug 15 x 2  VC for slow execution     Lumbar Exercises: Quadruped   [place]/Old Horse 5 reps   Single Arm Raise 5 reps   Single Arm Raise Weights (lbs) fatigues easily bil   Straight Leg Raise    Straight Leg Raises Limitations    Opposite Arm/Leg Raise    Opposite Arm/Leg Raise Limitations      Knee/Hip Exercises: Stretches   Gastroc Stretch      Knee/Hip Exercises: Standing    Heel Raises 10 reps   Heel Raises Limitations left sLS with march does not have as great an excursion as right     Knee/Hip Exercises: Seated   Other Seated Knee/Hip Exercises      Ankle Exercises: Seated   Other Seated Ankle Exercises      Bridging 10 x  Bent knee raise x 10 on right and left physioball red leg squeeze V exercises to UE flexion x 5 complete cycles Dead bug on mat,  UE only, then legs only and then controlateral x 10 each left and right. Extra time for rest.             PT Short Term Goals - 01/14/17 1146      PT SHORT TERM GOAL #1   Title "Demonstrate and verbalize techniques to reduce the risk of re-injury including: lifting, posture, body mechanics.    Baseline Pt is able to verbalize 3 strategies for better body mechancis   Time 6   Period Weeks   Status Achieved     PT SHORT TERM GOAL #2   Title "Pt will be independent with advanced HEP   Baseline She is independent with initial HEP   Time 6   Period Weeks   Status Achieved     PT SHORT TERM GOAL #3   Title "Pt will tolerate walking for 30 minutes without increased pain  with LRAD   Baseline 5-10 minutes due to endurance and dyspnea ( new today)   Time 6   Period Weeks   Status On-going     PT SHORT TERM GOAL #4   Title Pt will be able to negotiate step without exacerbating  pain in back   Baseline pt with 1/10   Time 6   Period Weeks   Status Achieved     PT SHORT TERM GOAL #5   Title pt will increase lower extremity MMT to 4/5 or greater for increased motor control   Baseline Hips 4-/5 now in abduction  See flowsheet   Time 6   Period Weeks   Status On-going     PT SHORT TERM GOAL #6   Title "FOTO will improve from 71% limitation   to 56% limitation     indicating improved functional mobility .    Baseline Foto improved to 47% limitation  Time 6   Period Weeks   Status Achieved     PT SHORT TERM GOAL #7   Title pt will be able to drive safely and turn head back without  exacerbating pain    Baseline depending on back up camera   Time 6   Period Weeks   Status Achieved           PT Long Term Goals - 01/14/17 1139      PT LONG TERM GOAL #1   Title Pt will find a community wellness center to continue strengthening post PT   Time 4   Period Weeks   Status New     PT LONG TERM GOAL #2   Title pt will increase lower extremity MMT to 4/5 or greater for increased motor control ( 02-18-17)   Time 4   Period Weeks   Status Revised     PT LONG TERM GOAL #3   Title Pt will be aware of  community wellness program for continued strengthening and endurance training. (02-18-17)   Time 4   Period Weeks   Status New     PT LONG TERM GOAL #4   Title Pt will be independent with advanced HEP (02-18-17)   Time 4   Period Weeks   Status New     PT LONG TERM GOAL #5   Title Pt will incorporate walking program for total of 30 to 45 minutes a day broken into increments of 7-10 minutes as pt can tolerate for improved movement and back rehab ( 02-18-17)   Time 4   Period Weeks               Plan - 01/14/17 1025    Clinical Impression Statement Pt was admitted into the hospital for 1 night 01-02-17 to 01-03-17.  Last PT visit, pt had progress note/recertification but pt needs extension of visits to complete goals and to solidify HEP for home use.  Pt BP on 2 BP meds was 139/69 with pulse 98 after minimal exercises. Pt reports hospital admission 1 night for   low potassium levels with anemia.  Pt missed appointment and this note will go for recertification for an additional 2x / week  5 weeks .  Pt with no difference in MMT from last visit.  Pt has completed most of STG's and is most  limited by endurance.  Pt  LE  MMT to 4-/5 from last visit but still has room for improvement.  She is still at 5/10 pain level especially at right lumbosacral area.  She is challenged in supine and quadriped exercises and shows need to improve core strength to maximize back rehab and  functional improvement.  Mrs. Standen will benfit from continued skilled PT to reinforce advanced HEP and transtion to community wellness programs.  Please recertify for and addtional 4-5 weeks  in order to achieve LTG's functional and strength goals.    Rehab Potential Good   PT Frequency 2x / week   PT Duration 4 weeks  through 02-18-17   PT Treatment/Interventions ADLs/Self Care Home Management;Cryotherapy;Electrical Stimulation;Iontophoresis 4mg /ml Dexamethasone;Stair training;Gait training;Moist Heat;Ultrasound;Therapeutic exercise;Neuromuscular re-education;Patient/family education;Passive range of motion;Manual techniques;Taping   PT Next Visit Plan , core, clams in sidelying, quadriped if allowed, quadriped and supine core progression/lumbar stabilization   PT Home Exercise Plan ab set, pelvic tilt.  gave posture body mechanics handout, clams, abdominal bracing, gait inside with log. added knee step ups forward, down step and lateral sidelying clams with red  tband and bridging, ankle strength with bands and standing. dead bug lumbar stab 2   Consulted and Agree with Plan of Care Patient      Patient will benefit from skilled therapeutic intervention in order to improve the following deficits and impairments:  Difficulty walking, Decreased mobility, Decreased strength, Postural dysfunction, Improper body mechanics, Pain, Hypomobility, Decreased range of motion, Decreased activity tolerance, Decreased knowledge of use of DME  Visit Diagnosis: Difficulty in walking, not elsewhere classified  Muscle weakness (generalized)  Midline low back pain without sciatica, unspecified chronicity  Abnormal posture       G-Codes - Jan 21, 2017 1033    Functional Assessment Tool Used Clinical Judgement after hospitalization   Functional Limitation Mobility: Walking and moving around   Mobility: Walking and Moving Around Current Status [phone]) At least 40 percent but less than 60 percent impaired, limited  or restricted  47%   Mobility: Walking and Moving Around Goal Status [phone]) At least 40 percent but less than 60 percent impaired, limited or restricted      Problem List Patient Active Problem List   Diagnosis Date Noted  . Hypokalemia 01/03/2017  . Chest pain 01/03/2017  . Chest tightness 01/02/2017  . Esophageal reflux 12/25/2016  . Gastroesophageal reflux disease without esophagitis 12/25/2016  . Hematemesis 12/25/2016  . History of colonic polyps 12/25/2016  . Irritable bowel syndrome 12/25/2016  . S/P lumbar fusion 10/21/2016  . Spinal stenosis of lumbar region with neurogenic claudication 09/11/2016  . Spondylolisthesis, lumbar region 09/11/2016  . A-fib ([place]) 03/21/2016  . Chronic pansinusitis 02/21/2016  . Facial pain, acute 02/21/2016  . Laryngopharyngeal reflux (LPR) 02/21/2016  . Chronic bilateral low back pain without sciatica 01/09/2016  . Degeneration, intervertebral disc, lumbar 01/09/2016  . Lumbar spinal stenosis 01/09/2016  . Spondylolisthesis at L2-L3 level 01/09/2016  . Calf pain 06/07/2013  . Atrial fibrillation ([place]) 04/02/2013  . PVC's (premature ventricular contractions) 01/12/2013  . Fibrocystic breast disease 10/30/2011  . Implantable loop recorder-[employer] Jude 07/09/2011  . Abnormal ECG-Possible ventricular preexcitation versus incomplete right bundle branch block   . Right bundle branch block   . Situational stress 03/13/2011  . HTN (hypertension)   . Chest pain    Voncille Lo, PT [facility] Adult  Jan 21, 2017 11:53 AM Phone: [phone] Fax: [place] [facility] [address] [place], [place], [address] Phone: [phone]   Fax:  [phone]  Name: Amber [name] MRN: [id number] Date of Birth: Oct 27, 1946

## 2017-01-15 ENCOUNTER — Ambulatory Visit (HOSPITAL_COMMUNITY)
Admission: RE | Admit: 2017-01-15 | Discharge: 2017-01-15 | Disposition: A | Discharge status: Home / Self Care | Payer: Medicare Other | Source: Ambulatory Visit | Attending: Nurse Practitioner | Admitting: Nurse Practitioner

## 2017-01-15 ENCOUNTER — Encounter (HOSPITAL_COMMUNITY): Payer: Self-pay | Admitting: Nurse Practitioner

## 2017-01-15 VITALS — BP 126/74 | HR 84 | Ht 63.0 in | Wt 167.0 lb

## 2017-01-15 DIAGNOSIS — Z9889 Other specified postprocedural states: Secondary | ICD-10-CM | POA: Diagnosis not present

## 2017-01-15 DIAGNOSIS — I10 Essential (primary) hypertension: Secondary | ICD-10-CM | POA: Diagnosis not present

## 2017-01-15 DIAGNOSIS — I481 Persistent atrial fibrillation: Secondary | ICD-10-CM | POA: Insufficient documentation

## 2017-01-15 DIAGNOSIS — D649 Anemia, unspecified: Secondary | ICD-10-CM | POA: Diagnosis not present

## 2017-01-15 DIAGNOSIS — I471 Supraventricular tachycardia: Secondary | ICD-10-CM

## 2017-01-15 DIAGNOSIS — Z79899 Other long term (current) drug therapy: Secondary | ICD-10-CM | POA: Insufficient documentation

## 2017-01-15 DIAGNOSIS — Z87891 Personal history of nicotine dependence: Secondary | ICD-10-CM | POA: Insufficient documentation

## 2017-01-15 MED ORDER — METOPROLOL SUCCINATE ER 25 MG PO TB24: 25.0000 mg | ORAL_TABLET | Freq: Every day | ORAL | 6 refills | Status: DC

## 2017-01-15 NOTE — Progress Notes (Signed)
Patient ID: Amber [name], female   DOB: 11-22-1946, 71 y.o.   MRN: [id number]    Primary Care Physician: Precious Reel, MD Referring Physician: Dr. Levon Hedger is a 71 y.o. female with a h/o afib with PVI per Dr. Rayann Heman 03/21/16. She had failed fecanide and was placed on amiodarone 200 mg  prior to ablation which was stopped after successful procedure and no further afib. She was in [facility] for tachycardia and chest pressure which did not show any acute issues. She was diagnosed with anemia a few months ago and has had more issues with tachycardia and shortness of breath with exertion. Initially HGB was at 9 and most recently improved to 10.6. She feels that she has an elervation of heart rate in the am and does much better in the last am and pm. Her BP combo pill, losartan/hctz was cut in half for a low Kt in the hospital, but BP has been elevated at home. Pt feels that she needs the full amount of losartan.  Returns to afib clinic after addition of pm metoprolol and has noticed less heart racing and dyspnea in am. She first tried 12.5 of metoprolol but noticed more improvement taking the full 25 mg. This is not afib per pt. She feels improved. Recent hgb at pcp office 10.3. BP improved with increase of losartan. Pending colonoscopy on Friday for w/u of  anemia.  Today, she denies symptoms of  chest pain, shortness of breath, orthopnea, PND, lower extremity edema, dizziness, presyncope, syncope, or neurologic sequela. Positive for fast heart beat at times with dyspnea. The patient is tolerating medications without difficulties and is otherwise without complaint today.   Past Medical History:  Diagnosis Date  . Abdominal mass    pt unaware  . Arthritis   . Chest pain    + Tn s/p cath 2012--Normal  . History of tobacco abuse   . HTN (hypertension)   . Hyperlipemia   . IBS (irritable bowel syndrome)   . Obesity   . Osteoporosis   . Persistent atrial fibrillation (HCC)    three  types  . Post-menopausal   . Right bundle branch block    Past Surgical History:  Procedure Laterality Date  . BREAST BIOPSY     left breast- benign  . BREAST REDUCTION SURGERY     BILATERAL  . CARDIAC CATHETERIZATION  02-25-2011   EF 60%. Left coronary artery arises and distributes normal, right coronary artery is a dominant vessel and is normal  . CARDIOVASCULAR STRESS TEST  08-27-2002   EF 76%  . CARPAL TUNNEL RELEASE    . ELECTROPHYSIOLOGIC STUDY N/A 03/21/2016   Procedure: Atrial Fibrillation Ablation;  Surgeon: Thompson Grayer, MD;  Location: [place] [facility];  Service: Cardiovascular;  Laterality: N/A;  . LOOP RECORDER EXPLANT N/A 12/28/2014   Procedure: LOOP RECORDER EXPLANT;  Surgeon: Deboraha Sprang, MD  . OVARIAN CYST REMOVAL  1978  . TEE WITHOUT CARDIOVERSION N/A 03/20/2016   Procedure: TRANSESOPHAGEAL ECHOCARDIOGRAM (TEE);  Surgeon: Skeet Latch, MD;  Location: [facility];  Service: Cardiovascular;  Laterality: N/A;  . TONSILLECTOMY AND ADENOIDECTOMY  AGE 41  . US ECHOCARDIOGRAPHY  12-23-2006   EF 55-60%    Current Outpatient Prescriptions  Medication Sig Dispense Refill  . acetaminophen (TYLENOL) 500 MG tablet Take 500 mg by mouth daily as needed for mild pain or headache.    Marland Kitchen amLODipine (NORVASC) 5 MG tablet Take 1 tablet (5 mg total) by mouth  daily. 180 tablet 3  . Cholecalciferol (VITAMIN D PO) Take 1 tablet by mouth daily.     . diazepam (VALIUM) 10 MG tablet Take 5 mg by mouth at bedtime as needed for sleep.    Marland Kitchen ELIQUIS 5 MG TABS tablet TAKE 1 TABLET BY MOUTH TWICE DAILY 180 tablet 3  . esomeprazole (NEXIUM) 40 MG capsule Take 40 mg by mouth daily as needed (heartburn).     . fish oil-omega-3 fatty acids 1000 MG capsule Take 1 g by mouth daily with lunch.     . folic acid (FOLVITE) A999333 MCG tablet Take 400 mcg by mouth daily.    Marland Kitchen loperamide (IMODIUM A-D) 2 MG tablet Take 2 mg by mouth 4 (four) times daily as needed for diarrhea or loose stools.    Marland Kitchen  losartan-hydrochlorothiazide (HYZAAR) 100-12.5 MG tablet Take 1 tablet by mouth daily. 30 tablet 6  . metoprolol succinate (TOPROL-XL) 100 MG 24 hr tablet Take 100 mg by mouth every morning. Take with or immediately following a meal.    . metoprolol succinate (TOPROL-XL) 25 MG 24 hr tablet Take 1 tablet (25 mg total) by mouth at bedtime. 30 tablet 6  . Multiple Vitamins-Minerals (AIRBORNE) LOZG Take 1 lozenge by mouth 4 (four) times daily as needed (for immune health).     . potassium chloride (K-DUR) 10 MEQ tablet Take 20 mEq by mouth daily.    . rosuvastatin (CRESTOR) 5 MG tablet Take 5 mg by mouth every other day.    . ferrous sulfate 325 (65 FE) MG tablet Take 1 tablet (325 mg total) by mouth 2 (two) times daily with a meal. (Patient not taking: Reported on 01/15/2017) 60 tablet 3   No current facility-administered medications for this encounter.     Allergies  Allergen Reactions  . Ciprofloxacin Itching and Swelling    Social History   Social History  . Marital status: Married    Spouse name: N/A  . Number of children: N/A  . Years of education: N/A   Occupational History  . Not on file.   Social History Main Topics  . Smoking status: Former Smoker    Years: 1.00    Types: Cigarettes    Start date: 12/17/1967    Quit date: 12/16/1968  . Smokeless tobacco: Never Used     Comment: Only smoked about 2 packs a month  . Alcohol use 4.2 oz/week    7 Standard drinks or equivalent per week     Comment: 1-2 wine or burboun per day  . Drug use: No  . Sexual activity: Not on file   Other Topics Concern  . Not on file   Social History Narrative   Pt lives in [place] with spouse.  Retired Chief Technology Officer.    Family History  Problem Relation Age of Onset  . Prostate cancer Father   . Heart failure Father   . Diabetes Father   . Atrial fibrillation Mother     ROS- All systems are reviewed and negative except as per the HPI above  Physical Exam: Vitals:    01/15/17 1131  BP: 126/74  Pulse: 84  Weight: 167 lb (75.8 kg)  Height: 5\' 3"  (1.6 m)    GEN- The patient is well appearing, alert and oriented x 3 today.   Head- normocephalic, atraumatic Eyes-  Sclera clear, conjunctiva pink Ears- hearing intact Oropharynx- clear Neck- supple, no JVP Lymph- no cervical lymphadenopathy Lungs- Clear to ausculation bilaterally, normal work of breathing  Heart- Regular rate and rhythm, no murmurs, rubs or gallops, PMI not laterally displaced GI- soft, NT, ND, + BS Extremities- no clubbing, cyanosis, or edema MS- no significant deformity or atrophy Skin- no rash or lesion Psych- euthymic mood, full affect Neuro- strength and sensation are intact  EKG- NSR pr int 84 ms, pr int 136 ms, qrs int 92 ms, qtc 430 ms Epic records  EKG showed Sinus tach at 117 bpm  Assessment and Plan: 1. Afib  Recent issues with elevated heart rate, esp in am Improved with toprol at 100 mg in am and addition of 25 mg  in pm Continue eliquis  2. HTN Well managed today Continue losartan 100 mg  and hctz 25 mg 1/2 tab in am  3. Anemia Has improved on po iron has scheduled colonoscopy next Friday  F/u with Dr. Rayann Heman 2/23    Geroge Baseman. Shamell Hittle, [facility] [address] [place], [place] [address] [phone]

## 2017-01-17 DIAGNOSIS — D126 Benign neoplasm of colon, unspecified: Secondary | ICD-10-CM | POA: Diagnosis not present

## 2017-01-17 DIAGNOSIS — R1013 Epigastric pain: Secondary | ICD-10-CM | POA: Diagnosis not present

## 2017-01-17 DIAGNOSIS — Z86010 Personal history of colonic polyps: Secondary | ICD-10-CM | POA: Diagnosis not present

## 2017-01-21 ENCOUNTER — Ambulatory Visit: Payer: Medicare Other | Attending: Internal Medicine | Admitting: Physical Therapy

## 2017-01-21 DIAGNOSIS — M545 Low back pain, unspecified: Secondary | ICD-10-CM

## 2017-01-21 DIAGNOSIS — M6281 Muscle weakness (generalized): Secondary | ICD-10-CM

## 2017-01-21 DIAGNOSIS — R293 Abnormal posture: Secondary | ICD-10-CM

## 2017-01-21 DIAGNOSIS — R262 Difficulty in walking, not elsewhere classified: Secondary | ICD-10-CM | POA: Diagnosis not present

## 2017-01-21 DIAGNOSIS — D126 Benign neoplasm of colon, unspecified: Secondary | ICD-10-CM | POA: Diagnosis not present

## 2017-01-21 NOTE — Therapy (Signed)
[place], [place], [address] Phone: [phone]   Fax:  [phone]  Physical Therapy Treatment  Patient Details  Name: Amber [name] MRN: [id number] Date of Birth: 1946-10-02 Referring Provider: Jenne Campus MD  Encounter Date: 01/21/2017      PT End of Session - 01/21/17 1741    Visit Number 11   Number of Visits 24   Date for PT Re-Evaluation 02/18/17   PT Start Time 1332   PT Stop Time 1420   PT Time Calculation (min) 48 min   Activity Tolerance Patient tolerated treatment well   Behavior During Therapy [facility] for tasks assessed/performed      Past Medical History:  Diagnosis Date  . Abdominal mass    pt unaware  . Arthritis   . Chest pain    + Tn s/p cath 2012--Normal  . History of tobacco abuse   . HTN (hypertension)   . Hyperlipemia   . IBS (irritable bowel syndrome)   . Obesity   . Osteoporosis   . Persistent atrial fibrillation (HCC)    three types  . Post-menopausal   . Right bundle branch block     Past Surgical History:  Procedure Laterality Date  . BREAST BIOPSY     left breast- benign  . BREAST REDUCTION SURGERY     BILATERAL  . CARDIAC CATHETERIZATION  02-25-2011   EF 60%. Left coronary artery arises and distributes normal, right coronary artery is a dominant vessel and is normal  . CARDIOVASCULAR STRESS TEST  08-27-2002   EF 76%  . CARPAL TUNNEL RELEASE    . ELECTROPHYSIOLOGIC STUDY N/A 03/21/2016   Procedure: Atrial Fibrillation Ablation;  Surgeon: Thompson Grayer, MD;  Location: [place] [facility];  Service: Cardiovascular;  Laterality: N/A;  . LOOP RECORDER EXPLANT N/A 12/28/2014   Procedure: LOOP RECORDER EXPLANT;  Surgeon: Deboraha Sprang, MD  . OVARIAN CYST REMOVAL  1978  . TEE WITHOUT CARDIOVERSION N/A 03/20/2016   Procedure: TRANSESOPHAGEAL ECHOCARDIOGRAM (TEE);  Surgeon: Skeet Latch, MD;  Location: [facility];  Service: Cardiovascular;  Laterality: N/A;  .  TONSILLECTOMY AND ADENOIDECTOMY  AGE 13  . US ECHOCARDIOGRAPHY  12-23-2006   EF 55-60%    There were no vitals filed for this visit.      Subjective Assessment - 01/21/17 1340    Subjective Sore increased after a colonscopy and endoscopy.  I have noticed wobbling going down the hallway at home due to tightness and sore.  I used a cane this morning.    Currently in Pain? Yes   Pain Score 5    Pain Location Back   Pain Orientation Right   Pain Descriptors / Indicators Tightness;Sore  strained (Might have needed to turn me while I was out for the tests)   Pain Frequency Intermittent   Aggravating Factors  medical tests recently   Pain Relieving Factors stretching, heat                         [facility] Adult PT Treatment/Exercise - 01/21/17 0001      High Level Balance   High Level Balance Comments static and dynamic balance activities with close SBA as you find in BERG.  SLS difficult.  Able to stand heel to toe static 30 seconds.  Able to tap stool alternatelt in 29 seconds.        Lumbar Exercises: Stretches   Passive Hamstring Stretch 3 reps;30 seconds  Single Knee to Chest Stretch 3 reps;30 seconds   Single Knee to Chest Stretch Limitations 2 sets,  1 set uded to decrase pain from exercise.   Lower Trunk Rotation 5 reps  10 seconds   Pelvic Tilt 5 reps     Lumbar Exercises: Supine   Clam 5 reps   Heel Slides 5 reps   Heel Slides Limitations leg off table, alternating   Bent Knee Raise 5 reps   Bent Knee Raise Limitations monitored for compensation   Bridge 10 reps   Straight Leg Raise 5 reps   Straight Leg Raises Limitations difficult right only     Lumbar Exercises: Sidelying   Clam 10 reps     Knee/Hip Exercises: Standing   Abduction Limitations Isometric hip abduction at wall with heel off floor 5-10 second holds.  Increased tightness in lateral low back.    Better with supine knee to chest following.    SLS 2 reps, intermittant hands,  SBA   Gait  Training cued for increased trunk rotation and hip movements,  Some soreness lateral back noted. when gait improved.                   PT Short Term Goals - 01/14/17 1146      PT SHORT TERM GOAL #1   Title "Demonstrate and verbalize techniques to reduce the risk of re-injury including: lifting, posture, body mechanics.    Baseline Pt is able to verbalize 3 strategies for better body mechancis   Time 6   Period Weeks   Status Achieved     PT SHORT TERM GOAL #2   Title "Pt will be independent with advanced HEP   Baseline She is independent with initial HEP   Time 6   Period Weeks   Status Achieved     PT SHORT TERM GOAL #3   Title "Pt will tolerate walking for 30 minutes without increased pain  with LRAD   Baseline 5-10 minutes due to endurance and dyspnea ( new today)   Time 6   Period Weeks   Status On-going     PT SHORT TERM GOAL #4   Title Pt will be able to negotiate step without exacerbating  pain in back   Baseline pt with 1/10   Time 6   Period Weeks   Status Achieved     PT SHORT TERM GOAL #5   Title pt will increase lower extremity MMT to 4/5 or greater for increased motor control   Baseline Hips 4-/5 now in abduction  See flowsheet   Time 6   Period Weeks   Status On-going     PT SHORT TERM GOAL #6   Title "FOTO will improve from 71% limitation   to 56% limitation     indicating improved functional mobility .    Baseline Foto improved to 47% limitation   Time 6   Period Weeks   Status Achieved     PT SHORT TERM GOAL #7   Title pt will be able to drive safely and turn head back without exacerbating pain    Baseline depending on back up camera   Time 6   Period Weeks   Status Achieved           PT Long Term Goals - 01/14/17 1139      PT LONG TERM GOAL #1   Title Pt will find a community wellness center to continue strengthening post PT   Time 4  Period Weeks   Status New     PT LONG TERM GOAL #2   Title pt will increase lower  extremity MMT to 4/5 or greater for increased motor control ( 02-18-17)   Time 4   Period Weeks   Status Revised     PT LONG TERM GOAL #3   Title Pt will be aware of  community wellness program for continued strengthening and endurance training. (02-18-17)   Time 4   Period Weeks   Status New     PT LONG TERM GOAL #4   Title Pt will be independent with advanced HEP (02-18-17)   Time 4   Period Weeks   Status New     PT LONG TERM GOAL #5   Title Pt will incorporate walking program for total of 30 to 45 minutes a day broken into increments of 7-10 minutes as pt can tolerate for improved movement and back rehab ( 02-18-17)   Time 4   Period Weeks               Plan - 01/21/17 1742    Clinical Impression Statement Pt focus on basic back exercises.  She has all the mat exercises we did today as they were previously issued.  Pain increased intermittantly during exercises supine and standing and were consistantly eased with stretching.  She has less pain at the end of the session.    PT Next Visit Plan , core, clams in sidelying, quadriped if allowed, quadriped and supine core progression/lumbar stabilization,  consider BERG patient feels her balance is worse.   PT Home Exercise Plan ab set, pelvic tilt.  gave posture body mechanics handout, clams, abdominal bracing, gait inside with log. added knee step ups forward, down step and lateral sidelying clams with red tband and bridging, ankle strength with bands and standing. dead bug lumbar stab 2 .   Consulted and Agree with Plan of Care Patient      Patient will benefit from skilled therapeutic intervention in order to improve the following deficits and impairments:  Difficulty walking, Decreased mobility, Decreased strength, Postural dysfunction, Improper body mechanics, Pain, Hypomobility, Decreased range of motion, Decreased activity tolerance, Decreased knowledge of use of DME  Visit Diagnosis: Difficulty in walking, not elsewhere  classified  Muscle weakness (generalized)  Midline low back pain without sciatica, unspecified chronicity  Abnormal posture     Problem List Patient Active Problem List   Diagnosis Date Noted  . Hypokalemia 01/03/2017  . Chest pain 01/03/2017  . Chest tightness 01/02/2017  . Esophageal reflux 12/25/2016  . Gastroesophageal reflux disease without esophagitis 12/25/2016  . Hematemesis 12/25/2016  . History of colonic polyps 12/25/2016  . Irritable bowel syndrome 12/25/2016  . S/P lumbar fusion 10/21/2016  . Spinal stenosis of lumbar region with neurogenic claudication 09/11/2016  . Spondylolisthesis, lumbar region 09/11/2016  . A-fib ([place]) 03/21/2016  . Chronic pansinusitis 02/21/2016  . Facial pain, acute 02/21/2016  . Laryngopharyngeal reflux (LPR) 02/21/2016  . Chronic bilateral low back pain without sciatica 01/09/2016  . Degeneration, intervertebral disc, lumbar 01/09/2016  . Lumbar spinal stenosis 01/09/2016  . Spondylolisthesis at L2-L3 level 01/09/2016  . Calf pain 06/07/2013  . Atrial fibrillation ([place]) 04/02/2013  . PVC's (premature ventricular contractions) 01/12/2013  . Fibrocystic breast disease 10/30/2011  . Implantable loop recorder-St. Jude 07/09/2011  . Abnormal ECG-Possible ventricular preexcitation versus incomplete right bundle branch block   . Right bundle branch block   . Situational stress 03/13/2011  . HTN (  hypertension)   . Chest pain     HARRIS,KAREN PTA 01/21/2017, 5:49 PM  [facility] [address] [place], [place], [address] Phone: [phone]   Fax:  [phone]  Name: TASANEE KENASTON MRN: [id number] Date of Birth: 1946-09-21

## 2017-01-22 DIAGNOSIS — Z4889 Encounter for other specified surgical aftercare: Secondary | ICD-10-CM | POA: Diagnosis not present

## 2017-01-22 DIAGNOSIS — Z981 Arthrodesis status: Secondary | ICD-10-CM | POA: Diagnosis not present

## 2017-01-22 DIAGNOSIS — M545 Low back pain: Secondary | ICD-10-CM | POA: Diagnosis not present

## 2017-01-22 DIAGNOSIS — M4316 Spondylolisthesis, lumbar region: Secondary | ICD-10-CM | POA: Diagnosis not present

## 2017-01-23 ENCOUNTER — Ambulatory Visit: Payer: Medicare Other | Admitting: Physical Therapy

## 2017-01-23 DIAGNOSIS — M545 Low back pain, unspecified: Secondary | ICD-10-CM

## 2017-01-23 DIAGNOSIS — R262 Difficulty in walking, not elsewhere classified: Secondary | ICD-10-CM | POA: Diagnosis not present

## 2017-01-23 DIAGNOSIS — R293 Abnormal posture: Secondary | ICD-10-CM | POA: Diagnosis not present

## 2017-01-23 DIAGNOSIS — M6281 Muscle weakness (generalized): Secondary | ICD-10-CM

## 2017-01-23 NOTE — Patient Instructions (Addendum)
Trigger Point Dry Needling  . What is Trigger Point Dry Needling (DN)? o DN is a physical therapy technique used to treat muscle pain and dysfunction. Specifically, DN helps deactivate muscle trigger points (muscle knots).  o A thin filiform needle is used to penetrate the skin and stimulate the underlying trigger point. The goal is for a local twitch response (LTR) to occur and for the trigger point to relax. No medication of any kind is injected during the procedure.   . What Does Trigger Point Dry Needling Feel Like?  o The procedure feels different for each individual patient. Some patients report that they do not actually feel the needle enter the skin and overall the process is not painful. Very mild bleeding may occur. However, many patients feel a deep cramping in the muscle in which the needle was inserted. This is the local twitch response.   Marland Kitchen How Will I feel after the treatment? o Soreness is normal, and the onset of soreness may not occur for a few hours. Typically this soreness does not last longer than two days.  o Bruising is uncommon, however; ice can be used to decrease any possible bruising.  o In rare cases feeling tired or nauseous after the treatment is normal. In addition, your symptoms may get worse before they get better, this period will typically not last longer than 24 hours.   . What Can I do After My Treatment? o Increase your hydration by drinking more water for the next 24 hours. o You may place ice or heat on the areas treated that have become sore, however, do not use heat on inflamed or bruised areas. Heat often brings more relief post needling. o You can continue your regular activities, but vigorous activity is not recommended initially after the treatment for 24 hours.  DN is best combined with other physical therapy such as strengthening, stretching, and other therapies.   Working on Insurance underwriter in corner with single limb stance and tandem stance with chair in  front   Eyes open and Eyes closes,  Brushing your teeth and doing single limb stance for balance as shown in clinic  Pt given handout for prone multifidus activation.  Prone pelvic press  10x right and left each Prone pelvic press with knee flex  Right and left 10 x each and then bilaterally x 10 Prone pelvic press with hip extension right and left 10 x each Prone pelvic press with knee flex and hip ext Right and left 10 times each  See handout given in clinic  Voncille Lo, PT Exercise Expert for the Aging Adult  01/23/17 1:43 PM Phone: [phone] Fax: [phone]

## 2017-01-23 NOTE — Therapy (Signed)
[place], [place], [address] Phone: [phone]   Fax:  [phone]  Physical Therapy Treatment  Patient Details  Name: Amber [name] MRN: [id number] Date of Birth: [birth year] Referring Provider: Jenne Campus MD  Encounter Date: 01/23/2017      PT End of Session - 01/23/17 1422    Visit Number 12   Number of Visits 24   Date for PT Re-Evaluation 02/18/17   Authorization Type [employer] (extended 2 x /week for 5 weeks)   Authorization Time Period 02/18/17   PT Start Time 0130   PT Stop Time 0217   PT Time Calculation (min) 47 min   Activity Tolerance Patient tolerated treatment well   Behavior During Therapy [facility] for tasks assessed/performed      Past Medical History:  Diagnosis Date  . Abdominal mass    pt unaware  . Arthritis   . Chest pain    + Tn s/p cath 2012--Normal  . History of tobacco abuse   . HTN (hypertension)   . Hyperlipemia   . IBS (irritable bowel syndrome)   . Obesity   . Osteoporosis   . Persistent atrial fibrillation (HCC)    three types  . Post-menopausal   . Right bundle branch block     Past Surgical History:  Procedure Laterality Date  . BREAST BIOPSY     left breast- benign  . BREAST REDUCTION SURGERY     BILATERAL  . CARDIAC CATHETERIZATION  02-25-2011   EF 60%. Left coronary artery arises and distributes normal, right coronary artery is a dominant vessel and is normal  . CARDIOVASCULAR STRESS TEST  08-27-2002   EF 76%  . CARPAL TUNNEL RELEASE    . ELECTROPHYSIOLOGIC STUDY N/A 03/21/2016   Procedure: Atrial Fibrillation Ablation;  Surgeon: Thompson Grayer, MD;  Location: [facility];  Service: Cardiovascular;  Laterality: N/A;  . LOOP RECORDER EXPLANT N/A 12/28/2014   Procedure: LOOP RECORDER EXPLANT;  Surgeon: Deboraha Sprang, MD  . OVARIAN CYST REMOVAL  1978  . TEE WITHOUT CARDIOVERSION N/A 03/20/2016   Procedure: TRANSESOPHAGEAL  ECHOCARDIOGRAM (TEE);  Surgeon: Skeet Latch, MD;  Location: [facility];  Service: Cardiovascular;  Laterality: N/A;  . TONSILLECTOMY AND ADENOIDECTOMY  AGE 63  . US ECHOCARDIOGRAPHY  12-23-2006   EF 55-60%    There were no vitals filed for this visit.      Subjective Assessment - 01/23/17 1331    Subjective I had a colonoscopy/endoscopy and I think they moved me around funny and my right back pain is so much worse.     Patient Stated Goals I want to be able to walk without a cane, do normal household chores laundry, unload dish washer, going up stairs   Pain Score 4   walking   Pain Location Back   Pain Orientation Right   Pain Descriptors / Indicators Tightness;Sore   Pain Type Surgical pain   Pain Onset More than a month ago   Pain Frequency Intermittent                         [facility] Adult PT Treatment/Exercise - 01/23/17 1352      High Level Balance   High Level Balance Comments 7 minutes of tandem stance and sLS in corner with Eyes open and closed  with chair in front for safety.    right leg 2 sec  left 5 sec  Self-Care   Self-Care Other Self-Care Comments   Other Self-Care Comments  community wellness opportunities but recommending Leatrice Jewels at PT pilates for further stretngthening post DC          Trigger Point Dry Needling - 01/23/17 1353    Consent Given? Yes   Education Handout Provided Yes   Muscles Treated Upper Body Gluteus maximus;Longissimus      Pt given handout for prone multifidus activation.  Prone pelvic press  10x right and left each Prone pelvic press with knee flex  Right and left 10 x each and then bilaterally x 10 Prone pelvic press with hip extension right and left 10 x each Prone pelvic press with knee flex and hip ext Right and left 10 times each Pt felt better post exercise and TDN on right lumbosacral area         PT Education - 01/23/17 1348    Education provided Yes   Education Details educated on  trigger point dryneedling, added balance SLS/Tandem in the corner and holding onto chair. Community wellness options Pilates with Leatrice Jewels and prone mulifidus    Person(s) Educated Patient   Methods Explanation;Demonstration;Tactile cues;Verbal cues;Handout   Comprehension Verbalized understanding;Returned demonstration          PT Short Term Goals - 01/14/17 1146      PT SHORT TERM GOAL #1   Title "Demonstrate and verbalize techniques to reduce the risk of re-injury including: lifting, posture, body mechanics.    Baseline Pt is able to verbalize 3 strategies for better body mechancis   Time 6   Period Weeks   Status Achieved     PT SHORT TERM GOAL #2   Title "Pt will be independent with advanced HEP   Baseline She is independent with initial HEP   Time 6   Period Weeks   Status Achieved     PT SHORT TERM GOAL #3   Title "Pt will tolerate walking for 30 minutes without increased pain  with LRAD   Baseline 5-10 minutes due to endurance and dyspnea ( new today)   Time 6   Period Weeks   Status On-going     PT SHORT TERM GOAL #4   Title Pt will be able to negotiate step without exacerbating  pain in back   Baseline pt with 1/10   Time 6   Period Weeks   Status Achieved     PT SHORT TERM GOAL #5   Title pt will increase lower extremity MMT to 4/5 or greater for increased motor control   Baseline Hips 4-/5 now in abduction  See flowsheet   Time 6   Period Weeks   Status On-going     PT SHORT TERM GOAL #6   Title "FOTO will improve from 71% limitation   to 56% limitation     indicating improved functional mobility .    Baseline Foto improved to 47% limitation   Time 6   Period Weeks   Status Achieved     PT SHORT TERM GOAL #7   Title pt will be able to drive safely and turn head back without exacerbating pain    Baseline depending on back up camera   Time 6   Period Weeks   Status Achieved           PT Long Term Goals - 01/14/17 1139      PT LONG TERM  GOAL #1   Title Pt will find a community wellness center  to continue strengthening post PT   Time 4   Period Weeks   Status New     PT LONG TERM GOAL #2   Title pt will increase lower extremity MMT to 4/5 or greater for increased motor control ( 02-18-17)   Time 4   Period Weeks   Status Revised     PT LONG TERM GOAL #3   Title Pt will be aware of  community wellness program for continued strengthening and endurance training. (02-18-17)   Time 4   Period Weeks   Status New     PT LONG TERM GOAL #4   Title Pt will be independent with advanced HEP (02-18-17)   Time 4   Period Weeks   Status New     PT LONG TERM GOAL #5   Title Pt will incorporate walking program for total of 30 to 45 minutes a day broken into increments of 7-10 minutes as pt can tolerate for improved movement and back rehab ( 02-18-17)   Time 4   Period Weeks               Plan - 01/23/17 1423    Clinical Impression Statement Pt enters clinic with 4/10 pain in right lumbosacral area with specific trigger point.  Pt consented to trigger point dry needling and was closely monitored in prone position.  Pt followed with soft tissue mobilization and sacral scrubbing with IASTYM tool.  Pt still had pain post treatment but reported somewhat relieved from prior pain level.  Pt was also educated  in community wellness opportunities  Pt  would most benefit from Pilates post rehab and Kerney Elbe was suggested due to her trainiig in cardiac rehab and a PT with a Pilates studio that will be able to continue care post DC.  Pt was concerned about balance issue and pt was shown techniques in corner to practive SLS, tandem stance with eyes open and closed.   Will continue   Rehab Potential Good   PT Frequency 2x / week   PT Duration 4 weeks   PT Treatment/Interventions ADLs/Self Care Home Management;Cryotherapy;Electrical Stimulation;Iontophoresis 4mg /ml Dexamethasone;Stair training;Gait training;Moist Heat;Ultrasound;Therapeutic  exercise;Neuromuscular re-education;Patient/family education;Passive range of motion;Manual techniques;Taping   PT Next Visit Plan , core, clams in sidelying, quadriped if allowed, quadriped and supine core progression/lumbar stabilization,  Pt is only unable to unilaterally stand due to glut med weakness, will continue with core training.   PT Home Exercise Plan ab set, pelvic tilt.  gave posture body mechanics handout, clams, abdominal bracing, gait inside with log. added knee step ups forward, down step and lateral sidelying clams with red tband and bridging, ankle strength with bands and standing. dead bug lumbar stab 2 .   Consulted and Agree with Plan of Care Patient      Patient will benefit from skilled therapeutic intervention in order to improve the following deficits and impairments:  Difficulty walking, Decreased mobility, Decreased strength, Postural dysfunction, Improper body mechanics, Pain, Hypomobility, Decreased range of motion, Decreased activity tolerance, Decreased knowledge of use of DME  Visit Diagnosis: Difficulty in walking, not elsewhere classified  Muscle weakness (generalized)  Midline low back pain without sciatica, unspecified chronicity  Abnormal posture     Problem List Patient Active Problem List   Diagnosis Date Noted  . Hypokalemia 01/03/2017  . Chest pain 01/03/2017  . Chest tightness 01/02/2017  . Esophageal reflux 12/25/2016  . Gastroesophageal reflux disease without esophagitis 12/25/2016  . Hematemesis 12/25/2016  . History of  colonic polyps 12/25/2016  . Irritable bowel syndrome 12/25/2016  . S/P lumbar fusion 10/21/2016  . Spinal stenosis of lumbar region with neurogenic claudication 09/11/2016  . Spondylolisthesis, lumbar region 09/11/2016  . A-fib ([place]) 03/21/2016  . Chronic pansinusitis 02/21/2016  . Facial pain, acute 02/21/2016  . Laryngopharyngeal reflux (LPR) 02/21/2016  . Chronic bilateral low back pain without sciatica  01/09/2016  . Degeneration, intervertebral disc, lumbar 01/09/2016  . Lumbar spinal stenosis 01/09/2016  . Spondylolisthesis at L2-L3 level 01/09/2016  . Calf pain 06/07/2013  . Atrial fibrillation ([place]) 04/02/2013  . PVC's (premature ventricular contractions) 01/12/2013  . Fibrocystic breast disease 10/30/2011  . Implantable loop recorder-St. Jude 07/09/2011  . Abnormal ECG-Possible ventricular preexcitation versus incomplete right bundle branch block   . Right bundle branch block   . Situational stress 03/13/2011  . HTN (hypertension)   . Chest pain    Voncille Lo, PT Exercise Expert for the Aging Adult  01/23/17 2:30 PM Phone: [phone] Fax: [facility] [address] [place], [place], [address] Phone: [phone]   Fax:  [phone]  Name: DONYELLE HOEN MRN: [id number] Date of Birth: [birth year]

## 2017-01-28 ENCOUNTER — Ambulatory Visit: Payer: Medicare Other | Admitting: Physical Therapy

## 2017-01-28 DIAGNOSIS — M545 Low back pain, unspecified: Secondary | ICD-10-CM

## 2017-01-28 DIAGNOSIS — R262 Difficulty in walking, not elsewhere classified: Secondary | ICD-10-CM

## 2017-01-28 DIAGNOSIS — M6281 Muscle weakness (generalized): Secondary | ICD-10-CM

## 2017-01-28 DIAGNOSIS — R293 Abnormal posture: Secondary | ICD-10-CM

## 2017-01-28 NOTE — Patient Instructions (Signed)
Remove patch 4-6 hours.

## 2017-01-28 NOTE — Therapy (Signed)
[place], [place], [address] Phone: [phone]   Fax:  [phone]  Physical Therapy Treatment  Patient Details  Name: Amber [name] MRN: [id number] Date of Birth: Jul 06, 1946 Referring Provider: Jenne Campus MD  Encounter Date: 01/28/2017      PT End of Session - 01/28/17 1553    Visit Number 13   Number of Visits 24   Date for PT Re-Evaluation 02/18/17   PT Start Time 1102   PT Stop Time 1150   PT Time Calculation (min) 48 min   Activity Tolerance Patient tolerated treatment well   Behavior During Therapy [facility] for tasks assessed/performed      Past Medical History:  Diagnosis Date  . Abdominal mass    pt unaware  . Arthritis   . Chest pain    + Tn s/p cath 2012--Normal  . History of tobacco abuse   . HTN (hypertension)   . Hyperlipemia   . IBS (irritable bowel syndrome)   . Obesity   . Osteoporosis   . Persistent atrial fibrillation (HCC)    three types  . Post-menopausal   . Right bundle branch block     Past Surgical History:  Procedure Laterality Date  . BREAST BIOPSY     left breast- benign  . BREAST REDUCTION SURGERY     BILATERAL  . CARDIAC CATHETERIZATION  02-25-2011   EF 60%. Left coronary artery arises and distributes normal, right coronary artery is a dominant vessel and is normal  . CARDIOVASCULAR STRESS TEST  08-27-2002   EF 76%  . CARPAL TUNNEL RELEASE    . ELECTROPHYSIOLOGIC STUDY N/A 03/21/2016   Procedure: Atrial Fibrillation Ablation;  Surgeon: Thompson Grayer, MD;  Location: [facility];  Service: Cardiovascular;  Laterality: N/A;  . LOOP RECORDER EXPLANT N/A 12/28/2014   Procedure: LOOP RECORDER EXPLANT;  Surgeon: Deboraha Sprang, MD  . OVARIAN CYST REMOVAL  1978  . TEE WITHOUT CARDIOVERSION N/A 03/20/2016   Procedure: TRANSESOPHAGEAL ECHOCARDIOGRAM (TEE);  Surgeon: Skeet Latch, MD;  Location: [facility];  Service: Cardiovascular;  Laterality: N/A;  .  TONSILLECTOMY AND ADENOIDECTOMY  AGE 71  . US ECHOCARDIOGRAPHY  12-23-2006   EF 55-60%    There were no vitals filed for this visit.      Subjective Assessment - 01/28/17 1106    Subjective Pain right low back worse.  The dry needling helped the pain for the rest of the day.(including the catches)  Has been doing her HE.  a few of prone exercises with lifting the whole leg.     Currently in Pain? Yes   Pain Score 4    Pain Location Back   Pain Orientation Right   Pain Descriptors / Indicators --  catches.  sore   Aggravating Factors  bending over   Pain Relieving Factors DN,  stretches, heat                         [facility] Adult PT Treatment/Exercise - 01/28/17 0001      Lumbar Exercises: Stretches   Single Knee to Chest Stretch 3 reps;20 seconds  each   Lower Trunk Rotation 5 reps   Pelvic Tilt 5 reps     Lumbar Exercises: Aerobic   Stationary Bike Nu step L6, legs only     Lumbar Exercises: Supine   Clam 5 reps   Bridge 10 reps   Bridge Limitations cathced noted with  lowering, 4/10   Straight Leg Raises Limitations 20 RT 10 left,  no pain today.   Other Supine Lumbar Exercises leglengthener, leg press 10 X 5 seconds each     Lumbar Exercises: Prone   Other Prone Lumbar Exercises multifitus over 2 pillows right fatigued with the 5th rep     Knee/Hip Exercises: Stretches   Quad Stretch Limitations left 10 seconds X 3,  right contract relax 10      Knee/Hip Exercises: Standing   Gait Training weight shifts with hip centered above medial arch to decrease trigger of quadratus lumorum.   40+ feet.   Other Standing Knee Exercises pre gait weight shifts arms sliding up wall, both and single leg shifts,  heavy cues initially,  monitiured multifitus with exercise,     Other Standing Knee Exercises small lunger multiple directions in parallel bars     Iontophoresis   Type of Iontophoresis Dexamethasone   Location lumbar paraspinals right   Dose 1 cc, 4 mg/ml   4-6 hour patvh   Time 5                  PT Short Term Goals - 01/28/17 1554      PT SHORT TERM GOAL #1   Title "Demonstrate and verbalize techniques to reduce the risk of re-injury including: lifting, posture, body mechanics.    Time 6   Period Weeks   Status Achieved     PT SHORT TERM GOAL #2   Title "Pt will be independent with advanced HEP   Time 6   Period Weeks   Status Achieved     PT SHORT TERM GOAL #3   Title "Pt will tolerate walking for 30 minutes without increased pain  with LRAD   Baseline limited by breathing per patient   Time 6   Period Weeks   Status Unable to assess     PT SHORT TERM GOAL #4   Title Pt will be able to negotiate step without exacerbating  pain in back   Time 6   Period Weeks   Status Achieved     PT SHORT TERM GOAL #5   Time 6   Period Weeks   Status Unable to assess     PT SHORT TERM GOAL #6   Title "FOTO will improve from 71% limitation   to 56% limitation     indicating improved functional mobility .    Time 6   Period Weeks   Status Achieved     PT SHORT TERM GOAL #7   Title pt will be able to drive safely and turn head back without exacerbating pain    Time 6   Period Weeks   Status Achieved           PT Long Term Goals - 01/14/17 1139      PT LONG TERM GOAL #1   Title Pt will find a community wellness center to continue strengthening post PT   Time 4   Period Weeks   Status New     PT LONG TERM GOAL #2   Title pt will increase lower extremity MMT to 4/5 or greater for increased motor control ( 02-18-17)   Time 4   Period Weeks   Status Revised     PT LONG TERM GOAL #3   Title Pt will be aware of  community wellness program for continued strengthening and endurance training. (02-18-17)   Time 4   Period Weeks  Status New     PT LONG TERM GOAL #4   Title Pt will be independent with advanced HEP (02-18-17)   Time 4   Period Weeks   Status New     PT LONG TERM GOAL #5   Title Pt will incorporate  walking program for total of 30 to 45 minutes a day broken into increments of 7-10 minutes as pt can tolerate for improved movement and back rehab ( 02-18-17)   Time 4   Period Weeks               Plan - 01/28/17 1553    Clinical Impression Statement Patient fatigues quickly with tasks.  in general, the "Catch" improves or disappears when Quadratus lumborum is deactivated.  Mild pain at end of session.   PT Next Visit Plan , core, clams in sidelying, quadriped if allowed, quadriped and supine core progression/lumbar stabilization,  Pt is only unable to unilaterally stand due to glut med weakness, will continue with core training.   PT Home Exercise Plan ab set, pelvic tilt.  gave posture body mechanics handout, clams, abdominal bracing, gait inside with log. added knee step ups forward, down step and lateral sidelying clams with red tband and bridging, ankle strength with bands and standing. dead bug lumbar stab 2 .   Consulted and Agree with Plan of Care Patient      Patient will benefit from skilled therapeutic intervention in order to improve the following deficits and impairments:  Difficulty walking, Decreased mobility, Decreased strength, Postural dysfunction, Improper body mechanics, Pain, Hypomobility, Decreased range of motion, Decreased activity tolerance, Decreased knowledge of use of DME  Visit Diagnosis: Difficulty in walking, not elsewhere classified  Muscle weakness (generalized)  Midline low back pain without sciatica, unspecified chronicity  Abnormal posture     Problem List Patient Active Problem List   Diagnosis Date Noted  . Hypokalemia 01/03/2017  . Chest pain 01/03/2017  . Chest tightness 01/02/2017  . Esophageal reflux 12/25/2016  . Gastroesophageal reflux disease without esophagitis 12/25/2016  . Hematemesis 12/25/2016  . History of colonic polyps 12/25/2016  . Irritable bowel syndrome 12/25/2016  . S/P lumbar fusion 10/21/2016  . Spinal stenosis  of lumbar region with neurogenic claudication 09/11/2016  . Spondylolisthesis, lumbar region 09/11/2016  . A-fib ([place]) 03/21/2016  . Chronic pansinusitis 02/21/2016  . Facial pain, acute 02/21/2016  . Laryngopharyngeal reflux (LPR) 02/21/2016  . Chronic bilateral low back pain without sciatica 01/09/2016  . Degeneration, intervertebral disc, lumbar 01/09/2016  . Lumbar spinal stenosis 01/09/2016  . Spondylolisthesis at L2-L3 level 01/09/2016  . Calf pain 06/07/2013  . Atrial fibrillation ([place]) 04/02/2013  . PVC's (premature ventricular contractions) 01/12/2013  . Fibrocystic breast disease 10/30/2011  . Implantable loop recorder-St. Jude 07/09/2011  . Abnormal ECG-Possible ventricular preexcitation versus incomplete right bundle branch block   . Right bundle branch block   . Situational stress 03/13/2011  . HTN (hypertension)   . Chest pain     Domanique Huesman PTA 01/28/2017, 3:57 PM  [facility] [address] [place], [place], [address] Phone: [phone]   Fax:  [phone]  Name: BREIONA BUCHHOLZ MRN: [id number] Date of Birth: August 10, 1946

## 2017-01-30 ENCOUNTER — Ambulatory Visit: Payer: Medicare Other | Admitting: Physical Therapy

## 2017-01-30 DIAGNOSIS — M545 Low back pain, unspecified: Secondary | ICD-10-CM

## 2017-01-30 DIAGNOSIS — R262 Difficulty in walking, not elsewhere classified: Secondary | ICD-10-CM | POA: Diagnosis not present

## 2017-01-30 DIAGNOSIS — M6281 Muscle weakness (generalized): Secondary | ICD-10-CM

## 2017-01-30 DIAGNOSIS — R293 Abnormal posture: Secondary | ICD-10-CM | POA: Diagnosis not present

## 2017-01-30 NOTE — Therapy (Signed)
[place], [place], [address] Phone: [phone]   Fax:  [phone]  Physical Therapy Treatment  Patient Details  Name: Amber [name] MRN: [id number] Date of Birth: 1945/12/18 Referring Provider: Jenne Campus MD  Encounter Date: 01/30/2017      PT End of Session - 01/30/17 1417    Visit Number 14   Number of Visits 24   Date for PT Re-Evaluation 02/18/17   Authorization Type Medicare [employer] (extended 2 x /week for 5 weeks)   Authorization Time Period 02/18/17   PT Start Time 0130   PT Stop Time 0230   PT Time Calculation (min) 60 min   Activity Tolerance Patient tolerated treatment well   Behavior During Therapy [facility] for tasks assessed/performed      Past Medical History:  Diagnosis Date  . Abdominal mass    pt unaware  . Arthritis   . Chest pain    + Tn s/p cath 2012--Normal  . History of tobacco abuse   . HTN (hypertension)   . Hyperlipemia   . IBS (irritable bowel syndrome)   . Obesity   . Osteoporosis   . Persistent atrial fibrillation (HCC)    three types  . Post-menopausal   . Right bundle branch block     Past Surgical History:  Procedure Laterality Date  . BREAST BIOPSY     left breast- benign  . BREAST REDUCTION SURGERY     BILATERAL  . CARDIAC CATHETERIZATION  02-25-2011   EF 60%. Left coronary artery arises and distributes normal, right coronary artery is a dominant vessel and is normal  . CARDIOVASCULAR STRESS TEST  08-27-2002   EF 76%  . CARPAL TUNNEL RELEASE    . ELECTROPHYSIOLOGIC STUDY N/A 03/21/2016   Procedure: Atrial Fibrillation Ablation;  Surgeon: Thompson Grayer, MD;  Location: [place] [facility];  Service: Cardiovascular;  Laterality: N/A;  . LOOP RECORDER EXPLANT N/A 12/28/2014   Procedure: LOOP RECORDER EXPLANT;  Surgeon: Deboraha Sprang, MD  . OVARIAN CYST REMOVAL  1978  . TEE WITHOUT CARDIOVERSION N/A 03/20/2016   Procedure: TRANSESOPHAGEAL  ECHOCARDIOGRAM (TEE);  Surgeon: Skeet Latch, MD;  Location: [place];  Service: Cardiovascular;  Laterality: N/A;  . TONSILLECTOMY AND ADENOIDECTOMY  AGE 71  . US ECHOCARDIOGRAPHY  12-23-2006   EF 55-60%    There were no vitals filed for this visit.      Subjective Assessment - 01/30/17 1340    Subjective Pain in right low back from 0/10 to 5/10.  TDN seems beneficial but not sure I have had enough to know   Pertinent History PLIF L2/3, L4/5 and L5 S1 , BBB, PVC's  A-fib infrequently since Ablation   Limitations Standing;Walking;House hold activities   Patient Stated Goals I want to be able to walk without a cane, do normal household chores laundry, unload dish washer, going up stairs   Currently in Pain? Yes   Pain Score 1    Pain Location Back   Pain Orientation Right   Pain Type Surgical pain   Pain Onset More than a month ago   Pain Frequency Intermittent                         [facility] Adult PT Treatment/Exercise - 01/30/17 1341      Knee/Hip Exercises: Stretches   Other Knee/Hip Stretches quadriped rocking forward x 20  clockwise and counterclock wise 20 x each.  1/10 pain in exercises     Manual Therapy   Manual Therapy Soft tissue mobilization;Myofascial release   Manual therapy comments IASTYM over right gluteals and lumbar paraspinals   Soft tissue mobilization piriformis, gluteals, quadratus lumborum   Myofascial Release right quadratus lumborum          Trigger Point Dry Needling - 01/30/17 1342    Consent Given? Yes   Education Handout Provided No  previously given   Muscles Treated Upper Body Longissimus;Gluteus maximus;Quadratus Lumborum;Piriformis  right side only L5 S 1    Muscles Treated Lower Body Gluteus maximus;Piriformis   Longissimus Response Twitch response elicited;Palpable increased muscle length   Gluteus Maximus Response Twitch response elicited;Palpable increased muscle length   Piriformis Response Twitch response  elicited;Palpable increased muscle length              PT Education - 01/30/17 1339    Education provided Yes   Education Details added quadriped rocking to [employer]) Educated Patient   Methods Explanation;Demonstration;Handout   Comprehension Verbalized understanding;Returned demonstration          PT Short Term Goals - 01/28/17 1554      PT SHORT TERM GOAL #1   Title "Demonstrate and verbalize techniques to reduce the risk of re-injury including: lifting, posture, body mechanics.    Time 6   Period Weeks   Status Achieved     PT SHORT TERM GOAL #2   Title "Pt will be independent with advanced HEP   Time 6   Period Weeks   Status Achieved     PT SHORT TERM GOAL #3   Title "Pt will tolerate walking for 30 minutes without increased pain  with LRAD   Baseline limited by breathing per patient   Time 6   Period Weeks   Status Unable to assess     PT SHORT TERM GOAL #4   Title Pt will be able to negotiate step without exacerbating  pain in back   Time 6   Period Weeks   Status Achieved     PT SHORT TERM GOAL #5   Time 6   Period Weeks   Status Unable to assess     PT SHORT TERM GOAL #6   Title "FOTO will improve from 71% limitation   to 56% limitation     indicating improved functional mobility .    Time 6   Period Weeks   Status Achieved     PT SHORT TERM GOAL #7   Title pt will be able to drive safely and turn head back without exacerbating pain    Time 6   Period Weeks   Status Achieved           PT Long Term Goals - 01/30/17 1414      PT LONG TERM GOAL #1   Title Pt will find a community wellness center to continue strengthening post PT   Time 4   Period Weeks   Status On-going     PT LONG TERM GOAL #2   Title pt will increase lower extremity MMT to 4/5 or greater for increased motor control ( 02-18-17)   Time 4   Period Weeks   Status On-going     PT LONG TERM GOAL #3   Title Pt will be aware of  community wellness program for  continued strengthening and endurance training. (02-18-17)   Time 4   Period Weeks   Status On-going  PT LONG TERM GOAL #4   Title Pt will be independent with advanced HEP (02-18-17)   Time 4   Period Weeks   Status On-going     PT LONG TERM GOAL #5   Title Pt will incorporate walking program for total of 30 to 45 minutes a day broken into increments of 7-10 minutes as pt can tolerate for improved movement and back rehab ( 02-18-17)   Time 4   Status On-going               Plan - 01/30/17 1417    Clinical Impression Statement Pt is most limited by fatigue.  Her right Lumbosacral pain or 'catch" improves with TDN and when deactivated with movement, Pt a 1/10 after session.  Added to HEP quadriped rocking for strenght and neuro reducation   Rehab Potential Good   PT Frequency 2x / week   PT Duration 4 weeks   PT Treatment/Interventions ADLs/Self Care Home Management;Cryotherapy;Electrical Stimulation;Iontophoresis 4mg /ml Dexamethasone;Stair training;Gait training;Moist Heat;Ultrasound;Therapeutic exercise;Neuromuscular re-education;Patient/family education;Passive range of motion;Manual techniques;Taping   PT Next Visit Plan , core, clams in sidelying, quadriped if allowed, quadriped and supine core progression/lumbar stabilization,  Pt is only unable to unilaterally stand due to glut med weakness, will continue with core training. Review HEP for DC after next visit /last visit for 3rd trigger point dry needling before DC   PT Home Exercise Plan ab set, pelvic tilt.  gave posture body mechanics handout, clams, abdominal bracing, gait inside with log. added knee step ups forward, down step and lateral sidelying clams with red tband and bridging, ankle strength with bands and standing. dead bug lumbar stab 2 . added quadriped rocking    Consulted and Agree with Plan of Care Patient      Patient will benefit from skilled therapeutic intervention in order to improve the following deficits  and impairments:  Difficulty walking, Decreased mobility, Decreased strength, Postural dysfunction, Improper body mechanics, Pain, Hypomobility, Decreased range of motion, Decreased activity tolerance, Decreased knowledge of use of DME  Visit Diagnosis: Difficulty in walking, not elsewhere classified  Muscle weakness (generalized)  Midline low back pain without sciatica, unspecified chronicity  Abnormal posture     Problem List Patient Active Problem List   Diagnosis Date Noted  . Hypokalemia 01/03/2017  . Chest pain 01/03/2017  . Chest tightness 01/02/2017  . Esophageal reflux 12/25/2016  . Gastroesophageal reflux disease without esophagitis 12/25/2016  . Hematemesis 12/25/2016  . History of colonic polyps 12/25/2016  . Irritable bowel syndrome 12/25/2016  . S/P lumbar fusion 10/21/2016  . Spinal stenosis of lumbar region with neurogenic claudication 09/11/2016  . Spondylolisthesis, lumbar region 09/11/2016  . A-fib ([place]) 03/21/2016  . Chronic pansinusitis 02/21/2016  . Facial pain, acute 02/21/2016  . Laryngopharyngeal reflux (LPR) 02/21/2016  . Chronic bilateral low back pain without sciatica 01/09/2016  . Degeneration, intervertebral disc, lumbar 01/09/2016  . Lumbar spinal stenosis 01/09/2016  . Spondylolisthesis at L2-L3 level 01/09/2016  . Calf pain 06/07/2013  . Atrial fibrillation ([place]) 04/02/2013  . PVC's (premature ventricular contractions) 01/12/2013  . Fibrocystic breast disease 10/30/2011  . Implantable loop recorder-St. Jude 07/09/2011  . Abnormal ECG-Possible ventricular preexcitation versus incomplete right bundle branch block   . Right bundle branch block   . Situational stress 03/13/2011  . HTN (hypertension)   . Chest pain     Voncille Lo, PT Exercise Expert for the Aging Adult  01/30/17 2:24 PM Phone: [phone] Fax: [phone]  [facility]  [place], [place],  [address] Phone: [phone]   Fax:  [phone]  Name: LEVERTA SCHRADER MRN: [id number] Date of Birth: 11-Oct-1946

## 2017-01-30 NOTE — Patient Instructions (Signed)
Quadriped [employer] with toes up x 20 times. Also put tongue on roof of mouth with all exercise (Feldenkrais) Arms are straight, head is up with eye looking across the room and toes are under and sit back on the feet.   Rock as far over your shoulder as you can without losing your posture  A. Thinking and feeling how your shoulder blades are moving.Greater Range of motion will occur with time.     Also do clockwise circles and counter clockwise circles.  Important  To do slowly and controlled.   Amber [name], PT Exercise Expert for the Aging Adult  01/30/17 1:39 PM Phone: [phone] Fax: [phone]

## 2017-02-04 ENCOUNTER — Ambulatory Visit: Payer: Medicare Other | Admitting: Physical Therapy

## 2017-02-04 DIAGNOSIS — R262 Difficulty in walking, not elsewhere classified: Secondary | ICD-10-CM | POA: Diagnosis not present

## 2017-02-04 DIAGNOSIS — M545 Low back pain, unspecified: Secondary | ICD-10-CM

## 2017-02-04 DIAGNOSIS — M6281 Muscle weakness (generalized): Secondary | ICD-10-CM

## 2017-02-04 DIAGNOSIS — R293 Abnormal posture: Secondary | ICD-10-CM

## 2017-02-04 NOTE — Therapy (Signed)
[place], [place], [address] Phone: [phone]   Fax:  [phone]  Physical Therapy Treatment  Patient Details  Name: Amber [name] MRN: [id number] Date of Birth: [birth year] Referring Provider: Jenne Campus MD  Encounter Date: 02/04/2017      PT End of Session - 02/04/17 1741    Visit Number 15   Number of Visits 24   Date for PT Re-Evaluation 02/18/17   PT Start Time 1331   PT Stop Time 1415   PT Time Calculation (min) 44 min   Activity Tolerance Patient tolerated treatment well   Behavior During Therapy [facility] for tasks assessed/performed      Past Medical History:  Diagnosis Date  . Abdominal mass    pt unaware  . Arthritis   . Chest pain    + Tn s/p cath 2012--Normal  . History of tobacco abuse   . HTN (hypertension)   . Hyperlipemia   . IBS (irritable bowel syndrome)   . Obesity   . Osteoporosis   . Persistent atrial fibrillation (HCC)    three types  . Post-menopausal   . Right bundle branch block     Past Surgical History:  Procedure Laterality Date  . BREAST BIOPSY     left breast- benign  . BREAST REDUCTION SURGERY     BILATERAL  . CARDIAC CATHETERIZATION  02-25-2011   EF 60%. Left coronary artery arises and distributes normal, right coronary artery is a dominant vessel and is normal  . CARDIOVASCULAR STRESS TEST  08-27-2002   EF 76%  . CARPAL TUNNEL RELEASE    . ELECTROPHYSIOLOGIC STUDY N/A 03/21/2016   Procedure: Atrial Fibrillation Ablation;  Surgeon: Thompson Grayer, MD;  Location: [place] [facility];  Service: Cardiovascular;  Laterality: N/A;  . LOOP RECORDER EXPLANT N/A 12/28/2014   Procedure: LOOP RECORDER EXPLANT;  Surgeon: Deboraha Sprang, MD  . OVARIAN CYST REMOVAL  1978  . TEE WITHOUT CARDIOVERSION N/A 03/20/2016   Procedure: TRANSESOPHAGEAL ECHOCARDIOGRAM (TEE);  Surgeon: Skeet Latch, MD;  Location: [place];  Service: Cardiovascular;  Laterality: N/A;  .  TONSILLECTOMY AND ADENOIDECTOMY  AGE 70  . US ECHOCARDIOGRAPHY  12-23-2006   EF 55-60%    There were no vitals filed for this visit.      Subjective Assessment - 02/04/17 1738    Subjective I was up all night with atrial Fib. Working on the pain is a good idea.    Currently in Pain? Yes   Pain Score --  mild   Pain Location Back   Pain Orientation Right   Pain Type Surgical pain   Aggravating Factors  bending   Pain Relieving Factors DN,, stretching,  massage,  heat   Multiple Pain Sites No                         [facility] Adult PT Treatment/Exercise - 02/04/17 0001      Lumbar Exercises: Aerobic   Stationary Bike Nu step with ball strapped, with 2 minutes     Lumbar Exercises: Supine   Glut Set 5 reps;5 seconds   Bridge 10 reps   Bridge Limitations legs on ball  min assist, legs     Knee/Hip Exercises: Seated   Knee/Hip Flexion 10, 1 LB each 10 x 2 sets   Sit to Sand 5 reps     Manual Therapy   Manual Therapy Soft tissue mobilization;Myofascial release   Manual  therapy comments paraspinals,  right SI area, upper gluteals.  tissue softened.   Myofascial Release checked Quadratus lumborum,  non tihjt today                  PT Short Term Goals - 02/04/17 1744      PT SHORT TERM GOAL #1   Title "Demonstrate and verbalize techniques to reduce the risk of re-injury including: lifting, posture, body mechanics.    Time 6   Period Weeks   Status Achieved     PT SHORT TERM GOAL #2   Title "Pt will be independent with advanced HEP   Time 6   Period Weeks   Status Achieved     PT SHORT TERM GOAL #3   Title "Pt will tolerate walking for 30 minutes without increased pain  with LRAD   Baseline limited by breathing per patient   Time 6   Period Weeks   Status Unable to assess     PT SHORT TERM GOAL #4   Title Pt will be able to negotiate step without exacerbating  pain in back   Time 6   Period Weeks   Status Achieved     PT SHORT TERM GOAL  #5   Title pt will increase lower extremity MMT to 4/5 or greater for increased motor control   Time 6   Period Weeks   Status Unable to assess     PT SHORT TERM GOAL #6   Title "FOTO will improve from 72% limitation   to 56% limitation     indicating improved functional mobility .    Time 6   Period Weeks   Status Achieved     PT SHORT TERM GOAL #7   Title pt will be able to drive safely and turn head back without exacerbating pain    Time 6   Period Weeks   Status Achieved           PT Long Term Goals - 02/04/17 1745      PT LONG TERM GOAL #1   Title Pt will find a community wellness center to continue strengthening post PT   Baseline Pilates with Donna Gamble has started.   Time 4   Period Weeks   Status Achieved     PT LONG TERM GOAL #2   Title pt will increase lower extremity MMT to 4/5 or greater for increased motor control ( 02-18-17)   Time 4   Period Weeks   Status Unable to assess     PT LONG TERM GOAL #3   Title Pt will be aware of  community wellness program for continued strengthening and endurance training. (02-18-17)   Baseline She has attended Pilates 2 X in community   Time 4   Period Weeks   Status On-going     PT LONG TERM GOAL #4   Title Pt will be independent with advanced HEP (02-18-17)   Baseline independent with exercises issued so far   Time 4   Period Weeks   Status On-going     PT LONG TERM GOAL #5   Title Pt will incorporate walking program for total of 30 to 45 minutes a day broken into increments of 7-10 minutes as pt can tolerate for improved movement and back rehab ( 02-18-17)   Time 4   Status Unable to assess               Plan - 02/04/17 1741      Clinical Impression Statement Patient fatigued form not sleeping well from Atrisal fib.  Focus on pain control today at patient's  and PT Donnetta Simpers Beardsley's request.  less pain at end of session.  No number given.  Patient wants to keep the next DN appointment then she will be ready  for D/C.  No new goals met or assessed today.  Patient is now able to tie both shoes.     PT Next Visit Plan DN then D/C   PT Home Exercise Plan ab set, pelvic tilt.  gave posture body mechanics handout, clams, abdominal bracing, gait inside with log. added knee step ups forward, down step and lateral sidelying clams with red tband and bridging, ankle strength with bands and standing. dead bug lumbar stab 2 . added quadriped rocking    Consulted and Agree with Plan of Care Patient      Patient will benefit from skilled therapeutic intervention in order to improve the following deficits and impairments:  Difficulty walking, Decreased mobility, Decreased strength, Postural dysfunction, Improper body mechanics, Pain, Hypomobility, Decreased range of motion, Decreased activity tolerance, Decreased knowledge of use of DME  Visit Diagnosis: Difficulty in walking, not elsewhere classified  Muscle weakness (generalized)  Midline low back pain without sciatica, unspecified chronicity  Abnormal posture     Problem List Patient Active Problem List   Diagnosis Date Noted  . Hypokalemia 01/03/2017  . Chest pain 01/03/2017  . Chest tightness 01/02/2017  . Esophageal reflux 12/25/2016  . Gastroesophageal reflux disease without esophagitis 12/25/2016  . Hematemesis 12/25/2016  . History of colonic polyps 12/25/2016  . Irritable bowel syndrome 12/25/2016  . S/P lumbar fusion 10/21/2016  . Spinal stenosis of lumbar region with neurogenic claudication 09/11/2016  . Spondylolisthesis, lumbar region 09/11/2016  . A-fib ([place]) 03/21/2016  . Chronic pansinusitis 02/21/2016  . Facial pain, acute 02/21/2016  . Laryngopharyngeal reflux (LPR) 02/21/2016  . Chronic bilateral low back pain without sciatica 01/09/2016  . Degeneration, intervertebral disc, lumbar 01/09/2016  . Lumbar spinal stenosis 01/09/2016  . Spondylolisthesis at L2-L3 level 01/09/2016  . Calf pain 06/07/2013  . Atrial fibrillation  ([place]) 04/02/2013  . PVC's (premature ventricular contractions) 01/12/2013  . Fibrocystic breast disease 10/30/2011  . Implantable loop recorder-St. Jude 07/09/2011  . Abnormal ECG-Possible ventricular preexcitation versus incomplete right bundle branch block   . Right bundle branch block   . Situational stress 03/13/2011  . HTN (hypertension)   . Chest pain     Olivier Frayre PTA 02/04/2017, 5:47 PM  [facility] [address] [place], [place], [address] Phone: [phone]   Fax:  [phone]  Name: AMARIONNA ARCA MRN: [id number] Date of Birth: 02-26-1946

## 2017-02-05 DIAGNOSIS — R350 Frequency of micturition: Secondary | ICD-10-CM | POA: Diagnosis not present

## 2017-02-05 DIAGNOSIS — Z124 Encounter for screening for malignant neoplasm of cervix: Secondary | ICD-10-CM | POA: Diagnosis not present

## 2017-02-05 DIAGNOSIS — Z01411 Encounter for gynecological examination (general) (routine) with abnormal findings: Secondary | ICD-10-CM | POA: Diagnosis not present

## 2017-02-06 ENCOUNTER — Ambulatory Visit: Payer: Medicare Other | Admitting: Physical Therapy

## 2017-02-06 DIAGNOSIS — M6281 Muscle weakness (generalized): Secondary | ICD-10-CM

## 2017-02-06 DIAGNOSIS — M545 Low back pain, unspecified: Secondary | ICD-10-CM

## 2017-02-06 DIAGNOSIS — R293 Abnormal posture: Secondary | ICD-10-CM | POA: Diagnosis not present

## 2017-02-06 DIAGNOSIS — R262 Difficulty in walking, not elsewhere classified: Secondary | ICD-10-CM | POA: Diagnosis not present

## 2017-02-06 DIAGNOSIS — I10 Essential (primary) hypertension: Secondary | ICD-10-CM | POA: Diagnosis not present

## 2017-02-06 NOTE — Therapy (Signed)
[place], [place], [address] Phone: [phone]   Fax:  [phone]  Physical Therapy Treatment  Patient Details  Name: Amber [name] MRN: [id number] Date of Birth: 01/19/1946 Referring Provider: Jenne Campus MD  Encounter Date: 02/06/2017      PT End of Session - 02/06/17 1333    Visit Number 16   Number of Visits 24   Date for PT Re-Evaluation 02/18/17   Authorization Type [employer] (extended 2 x /week for 5 weeks)   Authorization Time Period 02/18/17   PT Start Time 1330   PT Stop Time 1428   PT Time Calculation (min) 58 min   Activity Tolerance Patient tolerated treatment well   Behavior During Therapy [facility] for tasks assessed/performed      Past Medical History:  Diagnosis Date  . Abdominal mass    pt unaware  . Arthritis   . Chest pain    + Tn s/p cath 2012--Normal  . History of tobacco abuse   . HTN (hypertension)   . Hyperlipemia   . IBS (irritable bowel syndrome)   . Obesity   . Osteoporosis   . Persistent atrial fibrillation (HCC)    three types  . Post-menopausal   . Right bundle branch block     Past Surgical History:  Procedure Laterality Date  . BREAST BIOPSY     left breast- benign  . BREAST REDUCTION SURGERY     BILATERAL  . CARDIAC CATHETERIZATION  02-25-2011   EF 60%. Left coronary artery arises and distributes normal, right coronary artery is a dominant vessel and is normal  . CARDIOVASCULAR STRESS TEST  08-27-2002   EF 76%  . CARPAL TUNNEL RELEASE    . ELECTROPHYSIOLOGIC STUDY N/A 03/21/2016   Procedure: Atrial Fibrillation Ablation;  Surgeon: Thompson Grayer, MD;  Location: [facility];  Service: Cardiovascular;  Laterality: N/A;  . LOOP RECORDER EXPLANT N/A 12/28/2014   Procedure: LOOP RECORDER EXPLANT;  Surgeon: Deboraha Sprang, MD  . OVARIAN CYST REMOVAL  1978  . TEE WITHOUT CARDIOVERSION N/A 03/20/2016   Procedure: TRANSESOPHAGEAL  ECHOCARDIOGRAM (TEE);  Surgeon: Skeet Latch, MD;  Location: [facility];  Service: Cardiovascular;  Laterality: N/A;  . TONSILLECTOMY AND ADENOIDECTOMY  AGE 71  . US ECHOCARDIOGRAPHY  12-23-2006   EF 55-60%    There were no vitals filed for this visit.      Subjective Assessment - 02/06/17 1437    Subjective I am feeling great.  I truly feel better after the PT, dry needling and I am looking forward to goint to Pilates with Leatrice Jewels   Pertinent History PLIF L2/3, L4/5 and L5 S1 , BBB, PVC's  A-fib infrequently since Ablation   Limitations Standing;Walking;House hold activities   How long can you sit comfortably?  2 hours    How long can you stand comfortably? 30 min  limited by breathing more than pain   How long can you walk comfortably? 30 minutes limited by breathing more than pain   Diagnostic tests MRI   Patient Stated Goals I want to be able to walk without a cane, do normal household chores laundry, unload dish washer, going up stairs   Pain Score 0-No pain   Pain Location Back   Pain Orientation Right   Pain Descriptors / Indicators --  occasionally 'catches" but better with trigger point dry needling   Pain Type Surgical pain   Pain Onset More than a  month ago   Pain Frequency Occasional   Pain Relieving Factors DN, stretching, massage heat            [facility] PT Assessment - 02/06/17 1337      Observation/Other Assessments   Focus on Therapeutic Outcomes (FOTO)   Foto intake 65% limitation 35%  predicted 56%/ and on  1/11-18 47%     AROM   Overall AROM  Deficits   Lumbar Flexion 68   Lumbar Extension 18   Lumbar - Right Side Bend 20   Lumbar - Left Side Bend 23   Lumbar - Right Rotation 90%   Lumbar - Left Rotation 90%     Strength   Right Hip Flexion 4/5   Right Hip Extension 4/5   Right Hip ABduction 4/5   Left Hip Flexion 4/5   Left Hip Extension 4/5   Left Hip ABduction 4/5   Right Knee Flexion 4+/5   Right Knee Extension 4+/5   Left Knee  Flexion 4/5   Left Knee Extension 4/5   Right Ankle Dorsiflexion 4+/5   Right Ankle Plantar Flexion 4+/5   Left Ankle Dorsiflexion 4/5   Left Ankle Plantar Flexion 4/5  decreased vertical excursion                     [facility] Adult PT Treatment/Exercise - 02/06/17 1425      Self-Care   Other Self-Care Comments  Reviewed community wellness options to further stregnth and answered questions     Lumbar Exercises: Supine   Clam 5 reps   Heel Slides 5 reps   Heel Slides Limitations leg off table, alternating   Bent Knee Raise 5 reps   Bridge 10 reps   Straight Leg Raises Limitations 20 RT 10 left,  no pain today.     Knee/Hip Exercises: Stretches   Other Knee/Hip Stretches quadriped rocking forward x 20  clockwise and counterclock wise 20 x each.  1/10 pain in exercises     Ultrasound   Ultrasound Location right lumbosacral area   Ultrasound Parameters 50% 1.2 w/cm2   Ultrasound Goals Pain     Manual Therapy   Manual Therapy Soft tissue mobilization;Myofascial release   Manual therapy comments paraspinals,  right SI area, upper gluteals.  tissue softened.   Myofascial Release right quadratus lumborum          Trigger Point Dry Needling - 02/06/17 1348    Consent Given? Yes   Muscles Treated Upper Body Longissimus;Piriformis;Gluteus maximus;Quadratus Lumborum  right side only L5 S1/ QL palpable lengthening   Muscles Treated Lower Body Gluteus maximus;Gluteus minimus;Piriformis   Longissimus Response Twitch response elicited;Palpable increased muscle length   Gluteus Maximus Response Palpable increased muscle length   Gluteus Minimus Response Twitch response elicited;Palpable increased muscle length              PT Education - 02/06/17 1439    Education provided Yes   Education Details reviewed HEP questions, community services/wellness.     Person(s) Educated Patient   Methods Explanation;Demonstration;Handout   Comprehension Verbalized  understanding;Returned demonstration          PT Short Term Goals - 02/06/17 1433      PT SHORT TERM GOAL #1   Title "Demonstrate and verbalize techniques to reduce the risk of re-injury including: lifting, posture, body mechanics.    Baseline Pt is able to verbalize 3 strategies for better body mechancis   Time 6   Period Weeks  Status Achieved     PT SHORT TERM GOAL #2   Title "Pt will be independent with advanced HEP   Baseline She is independent with initial HEP   Time 6   Period Weeks   Status Achieved     PT SHORT TERM GOAL #3   Title "Pt will tolerate walking for 30 minutes without increased pain  with LRAD   Baseline limited by breathing per patient not by pain   Time 6   Period Weeks   Status Achieved     PT SHORT TERM GOAL #4   Title Pt will be able to negotiate step without exacerbating  pain in back   Baseline pt with 1/10   Time 6   Period Weeks   Status Achieved     PT SHORT TERM GOAL #6   Title "FOTO will improve from 71% limitation   to 56% limitation     indicating improved functional mobility .    Baseline FOTO improved to 355 limitation   Time 6   Period Weeks   Status Achieved     PT SHORT TERM GOAL #7   Title pt will be able to drive safely and turn head back without exacerbating pain    Baseline depending on back up camera   Time 6   Period Weeks   Status Achieved           PT Long Term Goals - 02/06/17 1434      PT LONG TERM GOAL #1   Title Pt will find a community wellness center to continue strengthening post PT   Baseline Pilates with Leatrice Jewels successful 1st visit   Time 4   Period Weeks   Status Achieved     PT LONG TERM GOAL #2   Title pt will increase lower extremity MMT to 4/5 or greater for increased motor control ( 02-18-17)   Time 4   Period Weeks   Status Achieved     PT LONG TERM GOAL #3   Title Pt will be aware of  community wellness program for continued strengthening and endurance training. (02-18-17)    Baseline She has attended Pilates 2 X in community   Time 4   Period Weeks   Status Achieved     PT LONG TERM GOAL #4   Title Pt will be independent with advanced HEP (02-18-17)   Baseline independent with exercises issued so far   Time 4   Period Weeks     PT LONG TERM GOAL #5   Title Pt will incorporate walking program for total of 30 to 45 minutes a day broken into increments of 7-10 minutes as pt can tolerate for improved movement and back rehab ( 02-18-17)   Baseline pt is walking more but limited more by breathing/endurance than pain   Time 4   Period Weeks   Status Partially Met               Plan - 02/06/17 1442    Clinical Impression Statement Pt walks into clinic with 0/10 pain in right lumbosacral area.  Pt does report that she occasionally has " catches" but overall she is pleased with currentl level of function.  Pt has improved MMT to 4/5 in LE at least throughout.  And AROM improved.  See flowsheet.  Pt improved FOTO to 35 % limtation ( predicted was 47%)  Pt will continue strengthening at PT Pilates for communty wellness and had attended two sessions and  is pleased with imporving function.  Will DC as goals have been met/partially met. Pt reports last week that she is able to tie her own shoes now.   Rehab Potential Good   PT Frequency 2x / week   PT Duration 4 weeks   PT Treatment/Interventions ADLs/Self Care Home Management;Cryotherapy;Electrical Stimulation;Iontophoresis 54m/ml Dexamethasone;Stair training;Gait training;Moist Heat;Ultrasound;Therapeutic exercise;Neuromuscular re-education;Patient/family education;Passive range of motion;Manual techniques;Taping   PT Next Visit Plan DC   PT Home Exercise Plan ab set, pelvic tilt.  gave posture body mechanics handout, clams, abdominal bracing, gait inside with log. added knee step ups forward, down step and lateral sidelying clams with red tband and bridging, ankle strength with bands and standing. dead bug lumbar stab  2 . added quadriped rocking    Consulted and Agree with Plan of Care Patient      Patient will benefit from skilled therapeutic intervention in order to improve the following deficits and impairments:  Difficulty walking, Decreased mobility, Decreased strength, Postural dysfunction, Improper body mechanics, Pain, Hypomobility, Decreased range of motion, Decreased activity tolerance, Decreased knowledge of use of DME  Visit Diagnosis: Difficulty in walking, not elsewhere classified  Muscle weakness (generalized)  Midline low back pain without sciatica, unspecified chronicity  Abnormal posture       G-Codes - [id number]    Functional Assessment Tool Used (Outpatient Only) FOTO   Functional Limitation Mobility: Walking and moving around   Mobility: Walking and Moving Around Goal Status ([phone] At least 40 percent but less than 60 percent impaired, limited or restricted   Mobility: Walking and Moving Around Discharge Status ([phone] At least 20 percent but less than 40 percent impaired, limited or restricted  35%      Problem List Patient Active Problem List   Diagnosis Date Noted  . Hypokalemia 01/03/2017  . Chest pain 01/03/2017  . Chest tightness 01/02/2017  . Esophageal reflux 12/25/2016  . Gastroesophageal reflux disease without esophagitis 12/25/2016  . Hematemesis 12/25/2016  . History of colonic polyps 12/25/2016  . Irritable bowel syndrome 12/25/2016  . S/P lumbar fusion 10/21/2016  . Spinal stenosis of lumbar region with neurogenic claudication 09/11/2016  . Spondylolisthesis, lumbar region 09/11/2016  . A-fib ([place] 03/21/2016  . Chronic pansinusitis 02/21/2016  . Facial pain, acute 02/21/2016  . Laryngopharyngeal reflux (LPR) 02/21/2016  . Chronic bilateral low back pain without sciatica 01/09/2016  . Degeneration, intervertebral disc, lumbar 01/09/2016  . Lumbar spinal stenosis 01/09/2016  . Spondylolisthesis at L2-L3 level 01/09/2016  . Calf pain  06/07/2013  . Atrial fibrillation ([place] 04/02/2013  . PVC's (premature ventricular contractions) 01/12/2013  . Fibrocystic breast disease 10/30/2011  . Implantable loop recorder-[employer] 07/09/2011  . Abnormal ECG-Possible ventricular preexcitation versus incomplete right bundle branch block   . Right bundle branch block   . Situational stress 03/13/2011  . HTN (hypertension)   . Chest pain     LVoncille Lo PT Certified Exercise Expert for the Aging Adult  003/13/182:46 PM Phone: [phone]: 3[facility]GHarristown N[place]aska [address]Phone: [phone]  Fax:  [phone] Name: CNASHONDA LIMBERGMRN: [id number] of Birth: 409/10/1946  PHYSICAL THERAPY DISCHARGE SUMMARY  Visits from Start of Care: 16  Current functional level related to goals / functional outcomes: As above  Remaining deficits: occassional " catch" in Right lumbosacral area but improving.     Education / Equipment: HEP and Trigger point dry needling and community wellness. Pt will  continue at PT Pilates  Plan: Patient agrees to discharge.  Patient goals were met. Patient is being discharged due to meeting the stated rehab goals.  ????? and is pleased with current level of function.    Voncille Lo, PT Certified Exercise Expert for the Aging Adult  02/06/17 2:47 PM Phone: [phone] Fax: [phone]

## 2017-02-07 ENCOUNTER — Ambulatory Visit (INDEPENDENT_AMBULATORY_CARE_PROVIDER_SITE_OTHER): Payer: Medicare Other | Admitting: Internal Medicine

## 2017-02-07 ENCOUNTER — Encounter: Payer: Self-pay | Admitting: Internal Medicine

## 2017-02-07 ENCOUNTER — Other Ambulatory Visit: Payer: Self-pay | Admitting: Internal Medicine

## 2017-02-07 VITALS — BP 120/66 | HR 76 | Ht 63.0 in | Wt 172.6 lb

## 2017-02-07 DIAGNOSIS — R0602 Shortness of breath: Secondary | ICD-10-CM

## 2017-02-07 DIAGNOSIS — D509 Iron deficiency anemia, unspecified: Secondary | ICD-10-CM | POA: Diagnosis not present

## 2017-02-07 DIAGNOSIS — I480 Paroxysmal atrial fibrillation: Secondary | ICD-10-CM

## 2017-02-07 DIAGNOSIS — I10 Essential (primary) hypertension: Secondary | ICD-10-CM | POA: Diagnosis not present

## 2017-02-07 MED ORDER — AMLODIPINE BESYLATE 5 MG PO TABS: 2.5000 mg | ORAL_TABLET | Freq: Every day | ORAL | 3 refills | Status: DC

## 2017-02-07 MED ORDER — METOPROLOL TARTRATE 25 MG PO TABS: ORAL_TABLET | ORAL | 3 refills | Status: AC

## 2017-02-07 NOTE — Telephone Encounter (Signed)
This is prn so should not need that many pills

## 2017-02-07 NOTE — Telephone Encounter (Signed)
Okay to change to a ninety day? Please advise. Thanks, MI

## 2017-02-07 NOTE — Progress Notes (Signed)
Electrophysiology Office Note   Date:  02/07/2017   ID:  Amber [name], Amber [name] 01-15-1946, MRN [id number]  PCP:  Precious Reel, MD  Primary Electrophysiologist: Dr Caryl Comes  Chief Complaint  Patient presents with  . Atrial Fibrillation     History of Present Illness: Amber [name] is a 71 y.o. female who presents today for electrophysiology evaluation.  She continues to have SOB but denies chest pain.  She has frequent palpitations at night.  She presented to the ED 01/02/17 (reviewed) and was found to have hypokalemia.  Her HCTZ was reduced.  Chest CT did not reveal PTE.  Recent echo was low risk. Today, she denies symptoms of chest pain, shortness of breath, orthopnea, PND, claudication, dizziness, presyncope, syncope, bleeding, or neurologic sequela. The patient is tolerating medications without difficulties and is otherwise without complaint today.    Past Medical History:  Diagnosis Date  . Abdominal mass    pt unaware  . Arthritis   . Chest pain    + Tn s/p cath 2012--Normal  . History of tobacco abuse   . HTN (hypertension)   . Hyperlipemia   . IBS (irritable bowel syndrome)   . Obesity   . Osteoporosis   . Persistent atrial fibrillation (HCC)    three types  . Post-menopausal   . Right bundle branch block    Past Surgical History:  Procedure Laterality Date  . BREAST BIOPSY     left breast- benign  . BREAST REDUCTION SURGERY     BILATERAL  . CARDIAC CATHETERIZATION  02-25-2011   EF 60%. Left coronary artery arises and distributes normal, right coronary artery is a dominant vessel and is normal  . CARDIOVASCULAR STRESS TEST  08-27-2002   EF 76%  . CARPAL TUNNEL RELEASE    . ELECTROPHYSIOLOGIC STUDY N/A 03/21/2016   Procedure: Atrial Fibrillation Ablation;  Surgeon: Thompson Grayer, MD;  Location: [facility];  Service: Cardiovascular;  Laterality: N/A;  . LOOP RECORDER EXPLANT N/A 12/28/2014   Procedure: LOOP RECORDER EXPLANT;  Surgeon: Deboraha Sprang, MD  .  OVARIAN CYST REMOVAL  1978  . TEE WITHOUT CARDIOVERSION N/A 03/20/2016   Procedure: TRANSESOPHAGEAL ECHOCARDIOGRAM (TEE);  Surgeon: Skeet Latch, MD;  Location: [facility];  Service: Cardiovascular;  Laterality: N/A;  . TONSILLECTOMY AND ADENOIDECTOMY  AGE 84  . US ECHOCARDIOGRAPHY  12-23-2006   EF 55-60%     Current Outpatient Prescriptions  Medication Sig Dispense Refill  . acetaminophen (TYLENOL) 500 MG tablet Take 500 mg by mouth daily as needed for mild pain or headache.    Marland Kitchen amLODipine (NORVASC) 5 MG tablet Take 1 tablet (5 mg total) by mouth daily. 180 tablet 3  . Cholecalciferol (VITAMIN D PO) Take 1 tablet by mouth daily.     Marland Kitchen ELIQUIS 5 MG TABS tablet TAKE 1 TABLET BY MOUTH TWICE DAILY 180 tablet 3  . esomeprazole (NEXIUM) 40 MG capsule Take 40 mg by mouth daily as needed (heartburn).     . ferrous sulfate 325 (65 FE) MG tablet Take 325 mg by mouth daily with breakfast.    . fish oil-omega-3 fatty acids 1000 MG capsule Take 1 g by mouth daily with lunch.     . folic acid (FOLVITE) A999333 MCG tablet Take 400 mcg by mouth daily.    Marland Kitchen loperamide (IMODIUM A-D) 2 MG tablet Take 2 mg by mouth 4 (four) times daily as needed for diarrhea or loose stools.    Marland Kitchen losartan-hydrochlorothiazide (HYZAAR) 100-12.5  MG tablet Take 1 tablet by mouth daily. 30 tablet 6  . metoprolol succinate (TOPROL-XL) 100 MG 24 hr tablet Take 100 mg by mouth every morning. Take with or immediately following a meal.    . metoprolol succinate (TOPROL-XL) 25 MG 24 hr tablet Take 1 tablet (25 mg total) by mouth at bedtime. 30 tablet 6  . Multiple Vitamins-Minerals (AIRBORNE) LOZG Take 1 lozenge by mouth 4 (four) times daily as needed (for immune health).     . potassium chloride (K-DUR) 10 MEQ tablet Take 10 mEq by mouth daily.     . rosuvastatin (CRESTOR) 5 MG tablet Take 5 mg by mouth every other day.     No current facility-administered medications for this visit.     Allergies:   Ciprofloxacin and Robaxin  [methocarbamol]   Social History:  The patient  reports that she quit smoking about 48 years ago. Her smoking use included Cigarettes. She started smoking about 49 years ago. She quit after 1.00 year of use. She has never used smokeless tobacco. She reports that she drinks about 4.2 oz of alcohol per week . She reports that she does not use drugs.   Family History:  The patient's  family history includes Atrial fibrillation in her mother; Diabetes in her father; Heart failure in her father; Prostate cancer in her father.    ROS:  Please see the history of present illness.   All other systems are reviewed and negative.    PHYSICAL EXAM: VS:  BP 120/66   Pulse 76   Ht 5\' 3"  (1.6 m)   Wt 172 lb 9.6 oz (78.3 kg)   SpO2 96%   BMI 30.57 kg/m  , BMI Body mass index is 30.57 kg/m. GEN: Well nourished, well developed, in no acute distress  HEENT: normal except for pale conjunctiva Neck: no JVD, carotid bruits, or masses Cardiac: RRR; no murmurs, rubs, or gallops,no edema  Respiratory:  clear to auscultation bilaterally, normal work of breathing GI: soft, nontender, nondistended, + BS MS: no deformity or atrophy  Skin: warm and dry  Neuro:  Strength and sensation are intact Psych: euthymic mood, full affect  EKG:  EKG is ordered today. The ekg ordered today shows sinus rhythm 76 bpm, RBBB, nonspecific ST/T changes  Labs from 02/07/18 (Dr Russo's office reviewed)  Lipid Panel     Component Value Date/Time   CHOL 192 04/15/2012 1004   TRIG 61.0 04/15/2012 1004   HDL 93.70 04/15/2012 1004   CHOLHDL 2 04/15/2012 1004   VLDL 12.2 04/15/2012 1004   LDLCALC 86 04/15/2012 1004     Wt Readings from Last 3 Encounters:  02/07/17 172 lb 9.6 oz (78.3 kg)  01/15/17 167 lb (75.8 kg)  01/09/17 169 lb (76.7 kg)     ASSESSMENT AND PLAN:  1.  Persistent afib Doing well s/p ablation off AAD therapy Continue long term anticoagulation Given palpitations of unclear cause, will proceed with  implantable loop recorder to evaluate her palpitations and for afib management post ablation.  Risks of procedure discussed with patient today who wishes to proceed. Add lopressor 25mg  1-2 tablets q6h prn palpitations  2. HTN Well controlled Due to mild ankle swelling at times, she wishes to reduce norvasc to 2.5mg  daily Recent bmet reviewed Dr Virgina Jock following  3. Anemia Improved Dr Virgina Jock following  4. SOB Unclear etiology Recent echo and chest CT unrevealing Will place ILR to evaluate for afib as the cause  Return to see me in 5  weeks  Signed, Thompson Grayer, MD  02/07/2017 3:47 PM     [place] [address] [phone] (office) [phone] (fax)

## 2017-02-07 NOTE — Patient Instructions (Addendum)
Medication Instructions:  Your physician has recommended you make the following change in your medication:  1) Decrease Amlodipine to 2.5 mg daily 2) Start Metoprolol 25 mg ---take 1-2 every 6 hours as needed for palpitations   Labwork: None ordered   Testing/Procedures:  LINQ implant on Mon 02/10/17---Please arrive at [facility] at 6:30am  Follow-Up: Your physician recommends that you schedule a follow-up appointment in: 10-14 days from 02/10/17 in device clinic for wound check and 5 weeks with Dr Rayann Heman    Any Other Special Instructions Will Be Listed Below (If Applicable).     If you need a refill on your cardiac medications before your next appointment, please call your pharmacy.

## 2017-02-10 ENCOUNTER — Encounter (HOSPITAL_COMMUNITY): Payer: Self-pay | Admitting: Internal Medicine

## 2017-02-10 ENCOUNTER — Encounter (HOSPITAL_COMMUNITY)
Admission: RE | Disposition: A | Discharge status: Home / Self Care | Payer: Self-pay | Source: Ambulatory Visit | Attending: Internal Medicine

## 2017-02-10 ENCOUNTER — Ambulatory Visit (HOSPITAL_COMMUNITY)
Admission: RE | Admit: 2017-02-10 | Discharge: 2017-02-10 | Disposition: A | Discharge status: Home / Self Care | Payer: Medicare Other | Source: Ambulatory Visit | Attending: Internal Medicine | Admitting: Internal Medicine

## 2017-02-10 DIAGNOSIS — D649 Anemia, unspecified: Secondary | ICD-10-CM | POA: Diagnosis not present

## 2017-02-10 DIAGNOSIS — R0602 Shortness of breath: Secondary | ICD-10-CM | POA: Insufficient documentation

## 2017-02-10 DIAGNOSIS — I4510 Unspecified right bundle-branch block: Secondary | ICD-10-CM | POA: Diagnosis not present

## 2017-02-10 DIAGNOSIS — M1990 Unspecified osteoarthritis, unspecified site: Secondary | ICD-10-CM | POA: Insufficient documentation

## 2017-02-10 DIAGNOSIS — E785 Hyperlipidemia, unspecified: Secondary | ICD-10-CM | POA: Insufficient documentation

## 2017-02-10 DIAGNOSIS — Z87891 Personal history of nicotine dependence: Secondary | ICD-10-CM | POA: Diagnosis not present

## 2017-02-10 DIAGNOSIS — I10 Essential (primary) hypertension: Secondary | ICD-10-CM | POA: Insufficient documentation

## 2017-02-10 DIAGNOSIS — I481 Persistent atrial fibrillation: Secondary | ICD-10-CM | POA: Diagnosis not present

## 2017-02-10 DIAGNOSIS — M810 Age-related osteoporosis without current pathological fracture: Secondary | ICD-10-CM | POA: Diagnosis not present

## 2017-02-10 DIAGNOSIS — E876 Hypokalemia: Secondary | ICD-10-CM | POA: Diagnosis not present

## 2017-02-10 DIAGNOSIS — Z833 Family history of diabetes mellitus: Secondary | ICD-10-CM | POA: Insufficient documentation

## 2017-02-10 DIAGNOSIS — Z7901 Long term (current) use of anticoagulants: Secondary | ICD-10-CM | POA: Diagnosis not present

## 2017-02-10 DIAGNOSIS — E669 Obesity, unspecified: Secondary | ICD-10-CM | POA: Insufficient documentation

## 2017-02-10 DIAGNOSIS — Z6830 Body mass index (BMI) 30.0-30.9, adult: Secondary | ICD-10-CM | POA: Insufficient documentation

## 2017-02-10 DIAGNOSIS — K589 Irritable bowel syndrome without diarrhea: Secondary | ICD-10-CM | POA: Diagnosis not present

## 2017-02-10 DIAGNOSIS — Z8249 Family history of ischemic heart disease and other diseases of the circulatory system: Secondary | ICD-10-CM | POA: Diagnosis not present

## 2017-02-10 DIAGNOSIS — I4891 Unspecified atrial fibrillation: Secondary | ICD-10-CM | POA: Diagnosis not present

## 2017-02-10 HISTORY — PX: LOOP RECORDER INSERTION: EP1214

## 2017-02-10 SURGERY — LOOP RECORDER INSERTION
Anesthesia: LOCAL

## 2017-02-10 MED ORDER — LIDOCAINE HCL (PF) 1 % IJ SOLN
INTRAMUSCULAR | Status: AC
  Filled 2017-02-10: qty 30

## 2017-02-10 MED ORDER — LIDOCAINE HCL (PF) 1 % IJ SOLN
INTRAMUSCULAR | Status: DC | PRN
  Administered 2017-02-10: 10 mL

## 2017-02-10 SURGICAL SUPPLY — 2 items
LOOP REVEAL LINQSYS (Prosthesis & Implant Heart) ×2 IMPLANT
PACK LOOP INSERTION (CUSTOM PROCEDURE TRAY) ×2 IMPLANT

## 2017-02-10 NOTE — Interval H&P Note (Signed)
History and Physical Interval Note:  02/10/2017 7:45 AM  Amber [name]  has presented today for surgery, with the diagnosis of afib  The various methods of treatment have been discussed with the patient and family. After consideration of risks, benefits and other options for treatment, the patient has consented to  Procedure(s): Loop Recorder Insertion (N/A) as a surgical intervention .  The patient's history has been reviewed, patient examined, no change in status, stable for surgery.  I have reviewed the patient's chart and labs.  Questions were answered to the patient's satisfaction.     Thompson Grayer

## 2017-02-10 NOTE — H&P (View-Only) (Signed)
Electrophysiology Office Note   Date:  02/07/2017   ID:  Amber [name], [place] [birth year], MRN [id number]  PCP:  Precious Reel, MD  Primary Electrophysiologist: Dr Caryl Comes  Chief Complaint  Patient presents with  . Atrial Fibrillation     History of Present Illness: Amber [name] is a 71 y.o. female who presents today for electrophysiology evaluation.  She continues to have SOB but denies chest pain.  She has frequent palpitations at night.  She presented to the ED 01/02/17 (reviewed) and was found to have hypokalemia.  Her HCTZ was reduced.  Chest CT did not reveal PTE.  Recent echo was low risk. Today, she denies symptoms of chest pain, shortness of breath, orthopnea, PND, claudication, dizziness, presyncope, syncope, bleeding, or neurologic sequela. The patient is tolerating medications without difficulties and is otherwise without complaint today.    Past Medical History:  Diagnosis Date  . Abdominal mass    pt unaware  . Arthritis   . Chest pain    + Tn s/p cath 2012--Normal  . History of tobacco abuse   . HTN (hypertension)   . Hyperlipemia   . IBS (irritable bowel syndrome)   . Obesity   . Osteoporosis   . Persistent atrial fibrillation (HCC)    three types  . Post-menopausal   . Right bundle branch block    Past Surgical History:  Procedure Laterality Date  . BREAST BIOPSY     left breast- benign  . BREAST REDUCTION SURGERY     BILATERAL  . CARDIAC CATHETERIZATION  02-25-2011   EF 60%. Left coronary artery arises and distributes normal, right coronary artery is a dominant vessel and is normal  . CARDIOVASCULAR STRESS TEST  08-27-2002   EF 76%  . CARPAL TUNNEL RELEASE    . ELECTROPHYSIOLOGIC STUDY N/A 03/21/2016   Procedure: Atrial Fibrillation Ablation;  Surgeon: Thompson Grayer, MD;  Location: [facility];  Service: Cardiovascular;  Laterality: N/A;  . LOOP RECORDER EXPLANT N/A 12/28/2014   Procedure: LOOP RECORDER EXPLANT;  Surgeon: Deboraha Sprang, MD  .  OVARIAN CYST REMOVAL  1978  . TEE WITHOUT CARDIOVERSION N/A 03/20/2016   Procedure: TRANSESOPHAGEAL ECHOCARDIOGRAM (TEE);  Surgeon: Skeet Latch, MD;  Location: [facility];  Service: Cardiovascular;  Laterality: N/A;  . TONSILLECTOMY AND ADENOIDECTOMY  AGE 65  . US ECHOCARDIOGRAPHY  12-23-2006   EF 55-60%     Current Outpatient Prescriptions  Medication Sig Dispense Refill  . acetaminophen (TYLENOL) 500 MG tablet Take 500 mg by mouth daily as needed for mild pain or headache.    Marland Kitchen amLODipine (NORVASC) 5 MG tablet Take 1 tablet (5 mg total) by mouth daily. 180 tablet 3  . Cholecalciferol (VITAMIN D PO) Take 1 tablet by mouth daily.     Marland Kitchen ELIQUIS 5 MG TABS tablet TAKE 1 TABLET BY MOUTH TWICE DAILY 180 tablet 3  . esomeprazole (NEXIUM) 40 MG capsule Take 40 mg by mouth daily as needed (heartburn).     . ferrous sulfate 325 (65 FE) MG tablet Take 325 mg by mouth daily with breakfast.    . fish oil-omega-3 fatty acids 1000 MG capsule Take 1 g by mouth daily with lunch.     . folic acid (FOLVITE) A999333 MCG tablet Take 400 mcg by mouth daily.    Marland Kitchen loperamide (IMODIUM A-D) 2 MG tablet Take 2 mg by mouth 4 (four) times daily as needed for diarrhea or loose stools.    Marland Kitchen losartan-hydrochlorothiazide (HYZAAR) 100-12.5  MG tablet Take 1 tablet by mouth daily. 30 tablet 6  . metoprolol succinate (TOPROL-XL) 100 MG 24 hr tablet Take 100 mg by mouth every morning. Take with or immediately following a meal.    . metoprolol succinate (TOPROL-XL) 25 MG 24 hr tablet Take 1 tablet (25 mg total) by mouth at bedtime. 30 tablet 6  . Multiple Vitamins-Minerals (AIRBORNE) LOZG Take 1 lozenge by mouth 4 (four) times daily as needed (for immune health).     . potassium chloride (K-DUR) 10 MEQ tablet Take 10 mEq by mouth daily.     . rosuvastatin (CRESTOR) 5 MG tablet Take 5 mg by mouth every other day.     No current facility-administered medications for this visit.     Allergies:   Ciprofloxacin and Robaxin  [methocarbamol]   Social History:  The patient  reports that she quit smoking about 48 years ago. Her smoking use included Cigarettes. She started smoking about 49 years ago. She quit after 1.00 year of use. She has never used smokeless tobacco. She reports that she drinks about 4.2 oz of alcohol per week . She reports that she does not use drugs.   Family History:  The patient's  family history includes Atrial fibrillation in her mother; Diabetes in her father; Heart failure in her father; Prostate cancer in her father.    ROS:  Please see the history of present illness.   All other systems are reviewed and negative.    PHYSICAL EXAM: VS:  BP 120/66   Pulse 76   Ht 5\' 3"  (1.6 m)   Wt 172 lb 9.6 oz (78.3 kg)   SpO2 96%   BMI 30.57 kg/m  , BMI Body mass index is 30.57 kg/m. GEN: Well nourished, well developed, in no acute distress  HEENT: normal except for pale conjunctiva Neck: no JVD, carotid bruits, or masses Cardiac: RRR; no murmurs, rubs, or gallops,no edema  Respiratory:  clear to auscultation bilaterally, normal work of breathing GI: soft, nontender, nondistended, + BS MS: no deformity or atrophy  Skin: warm and dry  Neuro:  Strength and sensation are intact Psych: euthymic mood, full affect  EKG:  EKG is ordered today. The ekg ordered today shows sinus rhythm 76 bpm, RBBB, nonspecific ST/T changes  Labs from 02/07/18 (Dr Russo's office reviewed)  Lipid Panel     Component Value Date/Time   CHOL 192 04/15/2012 1004   TRIG 61.0 04/15/2012 1004   HDL 93.70 04/15/2012 1004   CHOLHDL 2 04/15/2012 1004   VLDL 12.2 04/15/2012 1004   LDLCALC 86 04/15/2012 1004     Wt Readings from Last 3 Encounters:  02/07/17 172 lb 9.6 oz (78.3 kg)  01/15/17 167 lb (75.8 kg)  01/09/17 169 lb (76.7 kg)     ASSESSMENT AND PLAN:  1.  Persistent afib Doing well s/p ablation off AAD therapy Continue long term anticoagulation Given palpitations of unclear cause, will proceed with  implantable loop recorder to evaluate her palpitations and for afib management post ablation.  Risks of procedure discussed with patient today who wishes to proceed. Add lopressor 25mg  1-2 tablets q6h prn palpitations  2. HTN Well controlled Due to mild ankle swelling at times, she wishes to reduce norvasc to 2.5mg  daily Recent bmet reviewed Dr Virgina Jock following  3. Anemia Improved Dr Virgina Jock following  4. SOB Unclear etiology Recent echo and chest CT unrevealing Will place ILR to evaluate for afib as the cause  Return to see me in 5  weeks  Signed, Thompson Grayer, MD  02/07/2017 3:47 PM     [place] [address] [phone] (office) [phone] (fax)

## 2017-02-11 ENCOUNTER — Encounter: Payer: Medicare Other | Admitting: Physical Therapy

## 2017-02-13 ENCOUNTER — Ambulatory Visit: Payer: Medicare Other | Admitting: Physical Therapy

## 2017-02-20 ENCOUNTER — Ambulatory Visit (INDEPENDENT_AMBULATORY_CARE_PROVIDER_SITE_OTHER): Payer: Medicare Other | Admitting: *Deleted

## 2017-02-20 DIAGNOSIS — R002 Palpitations: Secondary | ICD-10-CM

## 2017-02-20 LAB — CUP PACEART INCLINIC DEVICE CHECK
Date Time Interrogation Session: 20180308150017
MDC IDC PG IMPLANT DT: 20180226

## 2017-02-20 NOTE — Progress Notes (Signed)
Wound check in clinic. No redness or edema. Incision edges approximated, wound well healed. Battery status: GOOD. R-waves 0.21mV. 0 tachy episodes, 0 pause episodes, 0 brady episodes. 1 symptom episodes, patient states feels palpitations-- ECG appears frequent PACs. 1 AF episodes (<0.1% burden)+ Eliquis, ECG appears SR with PACs. Monthly summary reports and ROV with JA 03/12/17.

## 2017-02-25 DIAGNOSIS — R1013 Epigastric pain: Secondary | ICD-10-CM | POA: Diagnosis not present

## 2017-02-25 DIAGNOSIS — D649 Anemia, unspecified: Secondary | ICD-10-CM | POA: Diagnosis not present

## 2017-03-12 ENCOUNTER — Ambulatory Visit (INDEPENDENT_AMBULATORY_CARE_PROVIDER_SITE_OTHER): Payer: Medicare Other | Admitting: Internal Medicine

## 2017-03-12 ENCOUNTER — Ambulatory Visit (INDEPENDENT_AMBULATORY_CARE_PROVIDER_SITE_OTHER): Payer: Medicare Other | Admitting: *Deleted

## 2017-03-12 VITALS — BP 136/76 | HR 93 | Ht 64.0 in | Wt 176.2 lb

## 2017-03-12 DIAGNOSIS — I480 Paroxysmal atrial fibrillation: Secondary | ICD-10-CM | POA: Diagnosis not present

## 2017-03-12 DIAGNOSIS — I10 Essential (primary) hypertension: Secondary | ICD-10-CM

## 2017-03-12 DIAGNOSIS — D649 Anemia, unspecified: Secondary | ICD-10-CM

## 2017-03-12 DIAGNOSIS — R002 Palpitations: Secondary | ICD-10-CM

## 2017-03-12 LAB — CUP PACEART INCLINIC DEVICE CHECK
Date Time Interrogation Session: 20180328121057
MDC IDC PG IMPLANT DT: 20180226

## 2017-03-12 MED ORDER — LOSARTAN POTASSIUM 100 MG PO TABS: 100.0000 mg | ORAL_TABLET | Freq: Every day | ORAL | 3 refills | Status: DC

## 2017-03-12 MED ORDER — POTASSIUM CHLORIDE ER 10 MEQ PO TBCR: 10.0000 meq | EXTENDED_RELEASE_TABLET | Freq: Two times a day (BID) | ORAL | 3 refills | Status: DC

## 2017-03-12 NOTE — Patient Instructions (Signed)
Medication Instructions:  Your physician has recommended you make the following change in your medication:  1) Stop Losartan/HCTZ 2) Start Losartan 100 mg daily 3) Increase Kdur to 20 mg twice daily   Labwork: None ordered   Testing/Procedures: None ordered   Follow-Up: Your physician recommends that you schedule a follow-up appointment in: 3 months with Dr Rayann Heman   Any Other Special Instructions Will Be Listed Below (If Applicable).     If you need a refill on your cardiac medications before your next appointment, please call your pharmacy.

## 2017-03-12 NOTE — Progress Notes (Signed)
Electrophysiology Office Note   Date:  03/12/2017   ID:  Amber [name], DOB September 26, 1946, MRN [id number]  PCP:  Precious Reel, MD  Primary Electrophysiologist: Dr Caryl Comes  Chief Complaint  Patient presents with  . Follow-up    SA node      History of Present Illness: Amber [name] is a 71 y.o. female who presents today for electrophysiology evaluation.  She is doing reasonably well.  She has PACs documented on her ILR that correlate to symptoms but is not having much afib. She has had low K and Na previously.  Today, she denies symptoms of chest pain, shortness of breath, orthopnea, PND, claudication, dizziness, presyncope, syncope, bleeding, or neurologic sequela. The patient is tolerating medications without difficulties and is otherwise without complaint today.    Past Medical History:  Diagnosis Date  . Abdominal mass    pt unaware  . Arthritis   . Chest pain    + Tn s/p cath 2012--Normal  . History of tobacco abuse   . HTN (hypertension)   . Hyperlipemia   . IBS (irritable bowel syndrome)   . Obesity   . Osteoporosis   . Persistent atrial fibrillation (HCC)    three types  . Post-menopausal   . Right bundle branch block    Past Surgical History:  Procedure Laterality Date  . BREAST BIOPSY     left breast- benign  . BREAST REDUCTION SURGERY     BILATERAL  . CARDIAC CATHETERIZATION  02-25-2011   EF 60%. Left coronary artery arises and distributes normal, right coronary artery is a dominant vessel and is normal  . CARDIOVASCULAR STRESS TEST  08-27-2002   EF 76%  . CARPAL TUNNEL RELEASE    . ELECTROPHYSIOLOGIC STUDY N/A 03/21/2016   Procedure: Atrial Fibrillation Ablation;  Surgeon: Thompson Grayer, MD;  Location: [facility];  Service: Cardiovascular;  Laterality: N/A;  . LOOP RECORDER EXPLANT N/A 12/28/2014   Procedure: LOOP RECORDER EXPLANT;  Surgeon: Deboraha Sprang, MD  . LOOP RECORDER INSERTION N/A 02/10/2017   Procedure: Loop Recorder Insertion;   Surgeon: Thompson Grayer, MD;  Location: [facility];  Service: Cardiovascular;  Laterality: N/A;  . OVARIAN CYST REMOVAL  1978  . TEE WITHOUT CARDIOVERSION N/A 03/20/2016   Procedure: TRANSESOPHAGEAL ECHOCARDIOGRAM (TEE);  Surgeon: Skeet Latch, MD;  Location: [facility];  Service: Cardiovascular;  Laterality: N/A;  . TONSILLECTOMY AND ADENOIDECTOMY  AGE 56  . US ECHOCARDIOGRAPHY  12-23-2006   EF 55-60%     Current Outpatient Prescriptions  Medication Sig Dispense Refill  . acetaminophen (TYLENOL) 500 MG tablet Take 500 mg by mouth daily as needed for mild pain or headache.    Marland Kitchen amLODipine (NORVASC) 5 MG tablet Take 0.5 tablets (2.5 mg total) by mouth daily. 45 tablet 3  . Cholecalciferol (VITAMIN D PO) Take 1 tablet by mouth daily.     Marland Kitchen ELIQUIS 5 MG TABS tablet TAKE 1 TABLET BY MOUTH TWICE DAILY 180 tablet 3  . esomeprazole (NEXIUM) 40 MG capsule Take 40 mg by mouth daily as needed (heartburn).     . ferrous sulfate 325 (65 FE) MG tablet Take 325 mg by mouth daily with breakfast.    . fish oil-omega-3 fatty acids 1000 MG capsule Take 1 g by mouth daily with lunch.     . folic acid (FOLVITE) 174 MCG tablet Take 400 mcg by mouth daily.    Marland Kitchen loperamide (IMODIUM A-D) 2 MG tablet Take 2 mg by  mouth 4 (four) times daily as needed for diarrhea or loose stools.    Marland Kitchen losartan-hydrochlorothiazide (HYZAAR) 100-12.5 MG tablet Take 1 tablet by mouth daily. 30 tablet 6  . metoprolol succinate (TOPROL-XL) 100 MG 24 hr tablet Take 100 mg by mouth every morning. Take with or immediately following a meal.    . metoprolol succinate (TOPROL-XL) 25 MG 24 hr tablet Take 1 tablet (25 mg total) by mouth at bedtime. 30 tablet 6  . metoprolol tartrate (LOPRESSOR) 25 MG tablet 1-2 tablets by mouth every 6 hours as need for palpitations 60 tablet 3  . Multiple Vitamins-Minerals (AIRBORNE) LOZG Take 1 lozenge by mouth 4 (four) times daily as needed (for immune health).     . potassium chloride (K-DUR) 10 MEQ  tablet Take 10 mEq by mouth daily.     . rosuvastatin (CRESTOR) 5 MG tablet Take 5 mg by mouth every other day.     No current facility-administered medications for this visit.     Allergies:   Ciprofloxacin and Robaxin [methocarbamol]   Social History:  The patient  reports that she quit smoking about 48 years ago. Her smoking use included Cigarettes. She started smoking about 49 years ago. She quit after 1.00 year of use. She has never used smokeless tobacco. She reports that she drinks about 4.2 oz of alcohol per week . She reports that she does not use drugs.   Family History:  The patient's  family history includes Atrial fibrillation in her mother; Diabetes in her father; Heart failure in her father; Prostate cancer in her father.    ROS:  Please see the history of present illness.   All other systems are reviewed and negative.    PHYSICAL EXAM: VS:  BP 136/76   Pulse 93   Ht 5\' 4"  (1.626 m)   Wt 176 lb 4 oz (79.9 kg)   SpO2 96%   BMI 30.25 kg/m  , BMI Body mass index is 30.25 kg/m. GEN: Well nourished, well developed, in no acute distress  HEENT: normal except for pale conjunctiva Neck: no JVD, carotid bruits, or masses Cardiac: RRR;  ,no edema  Respiratory:  clear to auscultation bilaterally, normal work of breathing GI: soft, nontender, nondistended, + BS MS: no deformity or atrophy  Skin: warm and dry  Neuro:  Strength and sensation are intact Psych: euthymic mood, full affect  EKG:  EKG is ordered today. The ekg ordered today shows sinus rhythm 76 bpm, RBBB, nonspecific ST/T changes  ILR is reviewed with the patient today    Lipid Panel     Component Value Date/Time   CHOL 192 04/15/2012 1004   TRIG 61.0 04/15/2012 1004   HDL 93.70 04/15/2012 1004   CHOLHDL 2 04/15/2012 1004   VLDL 12.2 04/15/2012 1004   LDLCALC 86 04/15/2012 1004     Wt Readings from Last 3 Encounters:  03/12/17 176 lb 4 oz (79.9 kg)  02/10/17 170 lb (77.1 kg)  02/07/17 172 lb 9.6  oz (78.3 kg)     ASSESSMENT AND PLAN:  1.  Persistent afib Doing well s/p ablation off AAD therapy Continue long term anticoagulation I have reassured her regarding her frequent PACs. AF is well controlled  2. HTN Well controlled Given low K and Na, will stop HCTZ at this time. She wishes to have further labs deferred pending follow-up with Dr Virgina Jock.  3. Anemia Improved Dr Virgina Jock following  Return to see me in 3 months  Signed, Thompson Grayer, MD  03/12/2017 10:50 AM     [facility] [address] [place] [address] [phone] (office) [phone] (fax)

## 2017-03-13 NOTE — Progress Notes (Signed)
Carelink Summary Report / Loop Recorder 

## 2017-03-17 DIAGNOSIS — L821 Other seborrheic keratosis: Secondary | ICD-10-CM | POA: Diagnosis not present

## 2017-03-17 DIAGNOSIS — L820 Inflamed seborrheic keratosis: Secondary | ICD-10-CM | POA: Diagnosis not present

## 2017-03-17 DIAGNOSIS — L309 Dermatitis, unspecified: Secondary | ICD-10-CM | POA: Diagnosis not present

## 2017-03-26 ENCOUNTER — Telehealth (HOSPITAL_COMMUNITY): Payer: Self-pay | Admitting: *Deleted

## 2017-03-26 NOTE — Telephone Encounter (Signed)
Pt cld to ask Ceasar Lund if with her current medications it would be okay to start Sertraline 25 mg.  PER Roderic Palau, NP nothing on her med list as far as cardiac meds seemed to be contraindicated but she should still check with her pharmacist when she has this filled to make sure it does not have any drug to drug interaction with something else that they may have filled.  Pt understood.

## 2017-03-27 LAB — CUP PACEART REMOTE DEVICE CHECK
MDC IDC PG IMPLANT DT: 20180226
MDC IDC SESS DTM: 20180328134046

## 2017-04-04 ENCOUNTER — Other Ambulatory Visit: Payer: Self-pay | Admitting: Internal Medicine

## 2017-04-11 ENCOUNTER — Ambulatory Visit (INDEPENDENT_AMBULATORY_CARE_PROVIDER_SITE_OTHER): Payer: Medicare Other | Admitting: *Deleted

## 2017-04-11 DIAGNOSIS — I480 Paroxysmal atrial fibrillation: Secondary | ICD-10-CM

## 2017-04-11 DIAGNOSIS — D649 Anemia, unspecified: Secondary | ICD-10-CM | POA: Diagnosis not present

## 2017-04-11 NOTE — Progress Notes (Signed)
Carelink Summary Report / Loop Recorder 

## 2017-04-18 DIAGNOSIS — H26491 Other secondary cataract, right eye: Secondary | ICD-10-CM | POA: Diagnosis not present

## 2017-04-18 DIAGNOSIS — H524 Presbyopia: Secondary | ICD-10-CM | POA: Diagnosis not present

## 2017-04-22 DIAGNOSIS — D649 Anemia, unspecified: Secondary | ICD-10-CM | POA: Diagnosis not present

## 2017-04-24 LAB — CUP PACEART REMOTE DEVICE CHECK
Implantable Pulse Generator Implant Date: 20180226
MDC IDC SESS DTM: 20180427140911

## 2017-05-01 ENCOUNTER — Ambulatory Visit (HOSPITAL_COMMUNITY)
Admission: RE | Admit: 2017-05-01 | Discharge: 2017-05-01 | Disposition: A | Discharge status: Home / Self Care | Payer: Medicare Other | Source: Ambulatory Visit | Attending: Nurse Practitioner | Admitting: Nurse Practitioner

## 2017-05-01 ENCOUNTER — Encounter (HOSPITAL_COMMUNITY): Payer: Self-pay | Admitting: Nurse Practitioner

## 2017-05-01 VITALS — BP 178/90 | HR 81 | Ht 64.0 in | Wt 174.2 lb

## 2017-05-01 DIAGNOSIS — E785 Hyperlipidemia, unspecified: Secondary | ICD-10-CM | POA: Insufficient documentation

## 2017-05-01 DIAGNOSIS — K589 Irritable bowel syndrome without diarrhea: Secondary | ICD-10-CM | POA: Diagnosis not present

## 2017-05-01 DIAGNOSIS — I10 Essential (primary) hypertension: Secondary | ICD-10-CM

## 2017-05-01 DIAGNOSIS — I4510 Unspecified right bundle-branch block: Secondary | ICD-10-CM | POA: Insufficient documentation

## 2017-05-01 DIAGNOSIS — E669 Obesity, unspecified: Secondary | ICD-10-CM | POA: Diagnosis not present

## 2017-05-01 DIAGNOSIS — Z7901 Long term (current) use of anticoagulants: Secondary | ICD-10-CM | POA: Diagnosis not present

## 2017-05-01 DIAGNOSIS — Z87891 Personal history of nicotine dependence: Secondary | ICD-10-CM | POA: Diagnosis not present

## 2017-05-01 DIAGNOSIS — Z79899 Other long term (current) drug therapy: Secondary | ICD-10-CM | POA: Diagnosis not present

## 2017-05-01 DIAGNOSIS — Z6829 Body mass index (BMI) 29.0-29.9, adult: Secondary | ICD-10-CM | POA: Insufficient documentation

## 2017-05-01 DIAGNOSIS — I481 Persistent atrial fibrillation: Secondary | ICD-10-CM | POA: Diagnosis not present

## 2017-05-01 MED ORDER — AMLODIPINE BESYLATE 10 MG PO TABS: 10.0000 mg | ORAL_TABLET | Freq: Every day | ORAL | 3 refills | Status: DC

## 2017-05-01 NOTE — Progress Notes (Signed)
Patient ID: Amber [name], female   DOB: [birth year], 71 y.o.   MRN: [id number]    Primary Care Physician: Shon Baton, MD Referring Physician: Dr. Levon Hedger is a 71 y.o. female with a h/o afib with PVI per Dr. Rayann Heman 03/21/16. She had failed fecanide and was placed on amiodarone 200 mg  prior to ablation which was stopped after successful procedure and no further afib. She is being seen in th afib clinic today for palpitations, lightheadedness and BP issues. She was having palpitations in March and was seen by Dr. Rayann Heman  And was reassured that she was having PAC's not afib. HCTZ was stopped at that time. BP today is elevated, initially 178/90 and rechecked at 160/84. Paceart reviewed and shows some PAC's, low afib burden.  Today, she denies symptoms of  chest pain, shortness of breath, orthopnea, PND, lower extremity edema, dizziness, presyncope, syncope, or neurologic sequela. Positive for irregular heart beat at times with some lightheadedness. The patient is tolerating medications without difficulties and is otherwise without complaint today.   Past Medical History:  Diagnosis Date  . Abdominal mass    pt unaware  . Arthritis   . Chest pain    + Tn s/p cath 2012--Normal  . History of tobacco abuse   . HTN (hypertension)   . Hyperlipemia   . IBS (irritable bowel syndrome)   . Obesity   . Osteoporosis   . Persistent atrial fibrillation (HCC)    three types  . Post-menopausal   . Right bundle branch block    Past Surgical History:  Procedure Laterality Date  . BREAST BIOPSY     left breast- benign  . BREAST REDUCTION SURGERY     BILATERAL  . CARDIAC CATHETERIZATION  02-25-2011   EF 60%. Left coronary artery arises and distributes normal, right coronary artery is a dominant vessel and is normal  . CARDIOVASCULAR STRESS TEST  08-27-2002   EF 76%  . CARPAL TUNNEL RELEASE    . ELECTROPHYSIOLOGIC STUDY N/A 03/21/2016   Procedure: Atrial Fibrillation Ablation;   Surgeon: Thompson Grayer, MD;  Location: [facility];  Service: Cardiovascular;  Laterality: N/A;  . LOOP RECORDER EXPLANT N/A 12/28/2014   Procedure: LOOP RECORDER EXPLANT;  Surgeon: Deboraha Sprang, MD  . LOOP RECORDER INSERTION N/A 02/10/2017   Procedure: Loop Recorder Insertion;  Surgeon: Thompson Grayer, MD;  Location: [facility];  Service: Cardiovascular;  Laterality: N/A;  . OVARIAN CYST REMOVAL  1978  . TEE WITHOUT CARDIOVERSION N/A 03/20/2016   Procedure: TRANSESOPHAGEAL ECHOCARDIOGRAM (TEE);  Surgeon: Skeet Latch, MD;  Location: [facility];  Service: Cardiovascular;  Laterality: N/A;  . TONSILLECTOMY AND ADENOIDECTOMY  AGE 9  . US ECHOCARDIOGRAPHY  12-23-2006   EF 55-60%    Current Outpatient Prescriptions  Medication Sig Dispense Refill  . acetaminophen (TYLENOL) 500 MG tablet Take 500 mg by mouth daily as needed for mild pain or headache.    Marland Kitchen amLODipine (NORVASC) 10 MG tablet Take 1 tablet (10 mg total) by mouth daily. 30 tablet 3  . Cholecalciferol (VITAMIN D PO) Take 1 tablet by mouth daily.     Marland Kitchen ELIQUIS 5 MG TABS tablet TAKE 1 TABLET BY MOUTH TWICE DAILY 180 tablet 3  . esomeprazole (NEXIUM) 40 MG capsule Take 40 mg by mouth daily as needed (heartburn).     . ferrous sulfate 325 (65 FE) MG tablet Take 325 mg by mouth daily with breakfast.    .  fish oil-omega-3 fatty acids 1000 MG capsule Take 1 g by mouth daily with lunch.     . loperamide (IMODIUM A-D) 2 MG tablet Take 2 mg by mouth 4 (four) times daily as needed for diarrhea or loose stools.    Marland Kitchen losartan (COZAAR) 100 MG tablet Take 1 tablet (100 mg total) by mouth daily. 90 tablet 3  . metoprolol succinate (TOPROL-XL) 25 MG 24 hr tablet Take 1 tablet (25 mg total) by mouth at bedtime. 30 tablet 6  . metoprolol tartrate (LOPRESSOR) 25 MG tablet 1-2 tablets by mouth every 6 hours as need for palpitations 60 tablet 3  . potassium chloride (K-DUR) 10 MEQ tablet Take 1 tablet (10 mEq total) by mouth 2 (two) times  daily. 180 tablet 3  . rosuvastatin (CRESTOR) 5 MG tablet Take 5 mg by mouth every other day.    . metoprolol succinate (TOPROL-XL) 100 MG 24 hr tablet Take 100 mg by mouth every morning. Take with or immediately following a meal.     No current facility-administered medications for this encounter.     Allergies  Allergen Reactions  . Ciprofloxacin Itching and Swelling  . Robaxin [Methocarbamol]     Blurry vision    Social History   Social History  . Marital status: Married    Spouse name: N/A  . Number of children: N/A  . Years of education: N/A   Occupational History  . Not on file.   Social History Main Topics  . Smoking status: Former Smoker    Years: 1.00    Types: Cigarettes    Start date: 12/17/1967    Quit date: 12/16/1968  . Smokeless tobacco: Never Used     Comment: Only smoked about 2 packs a month  . Alcohol use 4.2 oz/week    7 Standard drinks or equivalent per week     Comment: 1-2 wine or burboun per day  . Drug use: No  . Sexual activity: Not on file   Other Topics Concern  . Not on file   Social History Narrative   Pt lives in [place] with spouse.  Retired Chief Technology Officer.    Family History  Problem Relation Age of Onset  . Prostate cancer Father   . Heart failure Father   . Diabetes Father   . Atrial fibrillation Mother     ROS- All systems are reviewed and negative except as per the HPI above  Physical Exam: Vitals:   05/01/17 1404  BP: (!) 178/90  Pulse: 81  Weight: 174 lb 3.2 oz (79 kg)  Height: 5\' 4"  (1.626 m)    GEN- The patient is well appearing, alert and oriented x 3 today.   Head- normocephalic, atraumatic Eyes-  Sclera clear, conjunctiva pink Ears- hearing intact Oropharynx- clear Neck- supple, no JVP Lymph- no cervical lymphadenopathy Lungs- Clear to ausculation bilaterally, normal work of breathing Heart- Regular rate and rhythm, no murmurs, rubs or gallops, PMI not laterally displaced GI- soft, NT, ND, +  BS Extremities- no clubbing, cyanosis, or edema MS- no significant deformity or atrophy Skin- no rash or lesion Psych- euthymic mood, full affect Neuro- strength and sensation are intact  EKG- NSR pr int 84 ms, pr int 136 ms, qrs int 92 ms, qtc 430 ms Epic records  EKG showed Sinus tach at 117 bpm  Assessment and Plan: 1. Afib   Low burden Issues sound more like PAC's, reassured Continue toprol at 100 mg in am and addition of 25 mg  in pm Continue eliquis  2. HTN Poorly controlled Off hctz Continue losartan 100 mg   Increase amlodipine to 10 mg daily  F/u in two weeks    Butch Penny C. Selda Jalbert, [facility] [address] [place], [place] [address] [phone]

## 2017-05-01 NOTE — Patient Instructions (Signed)
Your physician has recommended you make the following change in your medication:  1)Norvasc 10mg  once a day

## 2017-05-13 ENCOUNTER — Ambulatory Visit (INDEPENDENT_AMBULATORY_CARE_PROVIDER_SITE_OTHER): Payer: Medicare Other | Admitting: *Deleted

## 2017-05-13 DIAGNOSIS — I480 Paroxysmal atrial fibrillation: Secondary | ICD-10-CM

## 2017-05-15 ENCOUNTER — Encounter (HOSPITAL_COMMUNITY): Payer: Self-pay | Admitting: Nurse Practitioner

## 2017-05-15 ENCOUNTER — Ambulatory Visit (HOSPITAL_COMMUNITY)
Admission: RE | Admit: 2017-05-15 | Discharge: 2017-05-15 | Disposition: A | Discharge status: Home / Self Care | Payer: Medicare Other | Source: Ambulatory Visit | Attending: Nurse Practitioner | Admitting: Nurse Practitioner

## 2017-05-15 DIAGNOSIS — Z0131 Encounter for examination of blood pressure with abnormal findings: Secondary | ICD-10-CM | POA: Insufficient documentation

## 2017-05-15 MED ORDER — AMLODIPINE BESYLATE 5 MG PO TABS: 7.5000 mg | ORAL_TABLET | Freq: Every day | ORAL | 3 refills | Status: DC

## 2017-05-15 NOTE — Progress Notes (Signed)
Carelink Summary Report 

## 2017-05-15 NOTE — Progress Notes (Signed)
Pt in today for BP check since increasing her Metoprolol by 25 mg at night and increasing her amlodipine to 10 mg QD.  Pt reports that she is actually taking Losartan-HCTZ 100/12.5 instead of plain losartan.  This was updated on med list.  Pt BP today was 156/80 initially.    BP after 10 mis of rest was improved at140/80 but states at home mostly over 140 sys. She had spironolactone in the past but this was stopped after back surgery due to low BP's. Amlodipine 10 mg is making ankles swell. Will decrease this to 7.5 mg a day, take losartan 100 mg daily, stop hctz, add back in spironolactone 25 mg a day. Will see back in one week for BP check. Continue 10 meq K+ for now until bmet is done next week.

## 2017-05-15 NOTE — Patient Instructions (Signed)
Your physician has recommended you make the following change in your medication:  1)Decrease Norvasc to 7.5mg  once a day (1 and 1/2 tablet of the 5mg  tablet)

## 2017-05-16 DIAGNOSIS — M7062 Trochanteric bursitis, left hip: Secondary | ICD-10-CM | POA: Diagnosis not present

## 2017-05-16 DIAGNOSIS — M7061 Trochanteric bursitis, right hip: Secondary | ICD-10-CM | POA: Diagnosis not present

## 2017-05-16 LAB — CUP PACEART REMOTE DEVICE CHECK
Date Time Interrogation Session: 20180527143749
Implantable Pulse Generator Implant Date: 20180226

## 2017-05-21 ENCOUNTER — Ambulatory Visit (HOSPITAL_COMMUNITY)
Admission: RE | Admit: 2017-05-21 | Discharge: 2017-05-21 | Disposition: A | Discharge status: Home / Self Care | Payer: Medicare Other | Source: Ambulatory Visit | Attending: Nurse Practitioner | Admitting: Nurse Practitioner

## 2017-05-21 VITALS — BP 134/72

## 2017-05-21 DIAGNOSIS — Z0130 Encounter for examination of blood pressure without abnormal findings: Secondary | ICD-10-CM | POA: Insufficient documentation

## 2017-05-21 DIAGNOSIS — I10 Essential (primary) hypertension: Secondary | ICD-10-CM

## 2017-05-21 LAB — BASIC METABOLIC PANEL
Anion gap: 8 (ref 5–15)
BUN: 13 mg/dL (ref 6–20)
CALCIUM: 9.9 mg/dL (ref 8.9–10.3)
CO2: 28 mmol/L (ref 22–32)
CREATININE: 0.69 mg/dL (ref 0.44–1.00)
Chloride: 95 mmol/L — ABNORMAL LOW (ref 101–111)
Glucose, Bld: 118 mg/dL — ABNORMAL HIGH (ref 65–99)
Potassium: 4.5 mmol/L (ref 3.5–5.1)
SODIUM: 131 mmol/L — AB (ref 135–145)

## 2017-05-21 NOTE — Progress Notes (Signed)
BP better managed with addition of spironolactone at 134/72. Bmet pending. Has f/u appointment with Dr. Rayann Heman in July.

## 2017-05-22 DIAGNOSIS — H26491 Other secondary cataract, right eye: Secondary | ICD-10-CM | POA: Diagnosis not present

## 2017-06-04 DIAGNOSIS — R350 Frequency of micturition: Secondary | ICD-10-CM | POA: Diagnosis not present

## 2017-06-04 DIAGNOSIS — R102 Pelvic and perineal pain: Secondary | ICD-10-CM | POA: Diagnosis not present

## 2017-06-06 DIAGNOSIS — R102 Pelvic and perineal pain: Secondary | ICD-10-CM | POA: Diagnosis not present

## 2017-06-10 ENCOUNTER — Ambulatory Visit (INDEPENDENT_AMBULATORY_CARE_PROVIDER_SITE_OTHER): Payer: Medicare Other | Admitting: *Deleted

## 2017-06-10 DIAGNOSIS — I480 Paroxysmal atrial fibrillation: Secondary | ICD-10-CM

## 2017-06-10 NOTE — Progress Notes (Signed)
Carelink Summary Report / Loop Recorder 

## 2017-06-16 ENCOUNTER — Encounter: Payer: Self-pay | Admitting: Internal Medicine

## 2017-06-16 ENCOUNTER — Ambulatory Visit (INDEPENDENT_AMBULATORY_CARE_PROVIDER_SITE_OTHER): Payer: Medicare Other | Admitting: Internal Medicine

## 2017-06-16 VITALS — BP 124/70 | HR 89 | Ht 64.0 in | Wt 171.0 lb

## 2017-06-16 DIAGNOSIS — I10 Essential (primary) hypertension: Secondary | ICD-10-CM

## 2017-06-16 DIAGNOSIS — I480 Paroxysmal atrial fibrillation: Secondary | ICD-10-CM | POA: Diagnosis not present

## 2017-06-16 NOTE — Progress Notes (Signed)
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 female who presents today for routine electrophysiology followup.  Since last being seen in our clinic, the patient reports doing very well.  Afib is well controlled post ablation off AAD therapy.  She has occasional PACs.  Today, she denies symptoms of  chest pain, shortness of breath,  lower extremity edema, dizziness, presyncope, or syncope.  The patient is otherwise without complaint today.   Past Medical History:  Diagnosis Date  . Abdominal mass    pt unaware  . Arthritis   . Chest pain    + Tn s/p cath 2012--Normal  . History of tobacco abuse   . HTN (hypertension)   . Hyperlipemia   . IBS (irritable bowel syndrome)   . Obesity   . Osteoporosis   . Persistent atrial fibrillation (HCC)    three types  . Post-menopausal   . Right bundle branch block    Past Surgical History:  Procedure Laterality Date  . BREAST BIOPSY     left breast- benign  . BREAST REDUCTION SURGERY     BILATERAL  . CARDIAC CATHETERIZATION  02-25-2011   EF 60%. Left coronary artery arises and distributes normal, right coronary artery is a dominant vessel and is normal  . CARDIOVASCULAR STRESS TEST  08-27-2002   EF 76%  . CARPAL TUNNEL RELEASE    . ELECTROPHYSIOLOGIC STUDY N/A 03/21/2016   Procedure: Atrial Fibrillation Ablation;  Surgeon: Thompson Grayer, MD;  Location: [facility];  Service: Cardiovascular;  Laterality: N/A;  . LOOP RECORDER EXPLANT N/A 12/28/2014   Procedure: LOOP RECORDER EXPLANT;  Surgeon: Deboraha Sprang, MD  . LOOP RECORDER INSERTION N/A 02/10/2017   Procedure: Loop Recorder Insertion;  Surgeon: Thompson Grayer, MD;  Location: [facility];  Service: Cardiovascular;  Laterality: N/A;  . OVARIAN CYST REMOVAL  1978  . TEE WITHOUT CARDIOVERSION N/A 03/20/2016   Procedure: TRANSESOPHAGEAL ECHOCARDIOGRAM (TEE);  Surgeon: Skeet Latch, MD;  Location: [facility];  Service: Cardiovascular;  Laterality:  N/A;  . TONSILLECTOMY AND ADENOIDECTOMY  AGE 58  . US ECHOCARDIOGRAPHY  12-23-2006   EF 55-60%    ROS- all systems are reviewed and negatives except as per HPI above  Current Outpatient Prescriptions  Medication Sig Dispense Refill  . amLODipine (NORVASC) 5 MG tablet Take 1.5 tablets (7.5 mg total) by mouth daily. 45 tablet 3  . Cholecalciferol (VITAMIN D PO) Take 1 tablet by mouth daily.     Marland Kitchen ELIQUIS 5 MG TABS tablet TAKE 1 TABLET BY MOUTH TWICE DAILY 180 tablet 3  . esomeprazole (NEXIUM) 40 MG capsule Take 40 mg by mouth daily as needed (heartburn).     . fish oil-omega-3 fatty acids 1000 MG capsule Take 1 g by mouth daily with lunch.     . loperamide (IMODIUM A-D) 2 MG tablet Take 2 mg by mouth 4 (four) times daily as needed for diarrhea or loose stools.    Marland Kitchen losartan (COZAAR) 100 MG tablet Take 100 mg by mouth daily.    . metoprolol succinate (TOPROL-XL) 100 MG 24 hr tablet Take 100 mg by mouth every morning. Take with or immediately following a meal.    . metoprolol succinate (TOPROL-XL) 25 MG 24 hr tablet Take 1 tablet (25 mg total) by mouth at bedtime. 30 tablet 6  . metoprolol tartrate (LOPRESSOR) 25 MG tablet 1-2 tablets by mouth every 6 hours as need for palpitations 60  tablet 3  . potassium chloride SA (K-DUR,KLOR-CON) 20 MEQ tablet Take 20 mEq by mouth daily.    . rosuvastatin (CRESTOR) 5 MG tablet Take 5 mg by mouth daily.     . sertraline (ZOLOFT) 25 MG tablet Take 25 mg by mouth daily.    Marland Kitchen spironolactone (ALDACTONE) 25 MG tablet Take 25 mg by mouth daily.     No current facility-administered medications for this visit.     Physical Exam: Vitals:   06/16/17 1059  BP: 124/70  Pulse: 89  SpO2: 98%  Weight: 171 lb (77.6 kg)  Height: 5\' 4"  (1.626 m)    GEN- The patient is well appearing, alert and oriented x 3 today.   Head- normocephalic, atraumatic Eyes-  Sclera clear, conjunctiva pink Ears- hearing intact Oropharynx- clear Lungs- Clear to ausculation  bilaterally, normal work of breathing Heart- Regular rate and rhythm, no murmurs, rubs or gallops, PMI not laterally displaced GI- soft, NT, ND, + BS Extremities- no clubbing, cyanosis, or edema  ILR interrogation ordered today is personally reviewed and shows sinus rhythm,  Symptomatic transmissions are sinus with PACs,  I do not see any afib  Assessment and Plan:  1. Persistent afib Well controlled post ablation off AAD therapy Continue long term anticoagulation ILR interrogation reveals sinus with PACs, no afib since implant  2. HTN Improved with recently increased amlodipine and addition of spironolactone She has labs with her PCP tomorrow and does not wish to have bmet today.  Importance of close follow-up with PCP discussed today. No change required today  She has moved to [facility].  She asks for referral to EP there.  I have made referral to Dr Remus Blake at [facility] to establish.  I will continue to follow via [facility] until she has been seen by his team.  Return to see me as needed.  Very pleasant lady.  I am happy to see again at any time if I can assist further.  Thompson Grayer MD, [facility] 06/16/2017 11:20 AM

## 2017-06-16 NOTE — Patient Instructions (Addendum)
Medication Instructions:    Your physician recommends that you continue on your current medications as directed. Please refer to the Current Medication list given to you today.  - If you need a refill on your cardiac medications before your next appointment, please call your pharmacy.   Labwork:  None ordered  Testing/Procedures:  None ordered  Follow-Up:  You have been referred to Dr. Stephenie Acres, in [facility],  to transfer/establish care for your AFib  Thank you for choosing [facility]!!    Any Other Special Instructions Will Be Listed Below (If Applicable).

## 2017-06-17 DIAGNOSIS — I10 Essential (primary) hypertension: Secondary | ICD-10-CM | POA: Diagnosis not present

## 2017-06-17 DIAGNOSIS — E784 Other hyperlipidemia: Secondary | ICD-10-CM | POA: Diagnosis not present

## 2017-06-17 DIAGNOSIS — M859 Disorder of bone density and structure, unspecified: Secondary | ICD-10-CM | POA: Diagnosis not present

## 2017-06-17 DIAGNOSIS — R7301 Impaired fasting glucose: Secondary | ICD-10-CM | POA: Diagnosis not present

## 2017-06-22 LAB — CUP PACEART REMOTE DEVICE CHECK
Date Time Interrogation Session: 20180626144549
MDC IDC PG IMPLANT DT: 20180226

## 2017-06-22 NOTE — Progress Notes (Signed)
[facility] summary report received. Battery status OK. Normal device function. No new symptom episodes, tachy episodes, brady, or pause episodes. No new AF episodes. +Eliquis. Monthly summary reports and ROV/PRN

## 2017-06-24 DIAGNOSIS — E784 Other hyperlipidemia: Secondary | ICD-10-CM | POA: Diagnosis not present

## 2017-06-24 DIAGNOSIS — D649 Anemia, unspecified: Secondary | ICD-10-CM | POA: Diagnosis not present

## 2017-06-24 DIAGNOSIS — I700 Atherosclerosis of aorta: Secondary | ICD-10-CM | POA: Diagnosis not present

## 2017-06-24 DIAGNOSIS — I10 Essential (primary) hypertension: Secondary | ICD-10-CM | POA: Diagnosis not present

## 2017-06-24 DIAGNOSIS — E668 Other obesity: Secondary | ICD-10-CM | POA: Diagnosis not present

## 2017-06-24 DIAGNOSIS — Z1389 Encounter for screening for other disorder: Secondary | ICD-10-CM | POA: Diagnosis not present

## 2017-06-24 DIAGNOSIS — K589 Irritable bowel syndrome without diarrhea: Secondary | ICD-10-CM | POA: Diagnosis not present

## 2017-06-24 DIAGNOSIS — Z6829 Body mass index (BMI) 29.0-29.9, adult: Secondary | ICD-10-CM | POA: Diagnosis not present

## 2017-06-24 DIAGNOSIS — I480 Paroxysmal atrial fibrillation: Secondary | ICD-10-CM | POA: Diagnosis not present

## 2017-06-24 DIAGNOSIS — R7301 Impaired fasting glucose: Secondary | ICD-10-CM | POA: Diagnosis not present

## 2017-06-24 DIAGNOSIS — Z0000 Encounter for general adult medical examination without abnormal findings: Secondary | ICD-10-CM | POA: Diagnosis not present

## 2017-06-24 DIAGNOSIS — F325 Major depressive disorder, single episode, in full remission: Secondary | ICD-10-CM | POA: Diagnosis not present

## 2017-06-26 ENCOUNTER — Other Ambulatory Visit: Payer: Self-pay | Admitting: Internal Medicine

## 2017-06-27 DIAGNOSIS — Z1212 Encounter for screening for malignant neoplasm of rectum: Secondary | ICD-10-CM | POA: Diagnosis not present

## 2017-07-02 ENCOUNTER — Telehealth: Payer: Self-pay | Admitting: Cardiology

## 2017-07-02 NOTE — Telephone Encounter (Signed)
Spoke w/ pt and requested that she send a manual transmission b/c her home monitor has not updated in at least 14 days.   

## 2017-07-10 ENCOUNTER — Ambulatory Visit (INDEPENDENT_AMBULATORY_CARE_PROVIDER_SITE_OTHER): Payer: Medicare Other | Admitting: *Deleted

## 2017-07-10 DIAGNOSIS — I480 Paroxysmal atrial fibrillation: Secondary | ICD-10-CM

## 2017-07-11 NOTE — Progress Notes (Signed)
Carelink Summary Report / Loop Recorder 

## 2017-07-17 ENCOUNTER — Telehealth: Payer: Self-pay | Admitting: *Deleted

## 2017-07-17 NOTE — Telephone Encounter (Signed)
New message   Pt states that she left her device in [place] and it will be a few days before we get her transmissions. No call back needed.

## 2017-07-23 NOTE — Telephone Encounter (Signed)
Noted  

## 2017-07-24 LAB — CUP PACEART REMOTE DEVICE CHECK
MDC IDC PG IMPLANT DT: 20180226
MDC IDC SESS DTM: 20180726153813

## 2017-07-31 ENCOUNTER — Other Ambulatory Visit: Payer: Self-pay | Admitting: Internal Medicine

## 2017-07-31 ENCOUNTER — Other Ambulatory Visit (HOSPITAL_COMMUNITY): Payer: Self-pay | Admitting: Nurse Practitioner

## 2017-07-31 NOTE — Telephone Encounter (Signed)
Age 71 years Wt 77.6kg 06/16/2017  Saw Dr Rayann Heman om 06/16/2017 05/21/2017 Sr Cr 0.69 01/02/2017 Hgb 10.6 HCT 33.9 Refill done for Eliquis 5mg  q 12 hours as requested

## 2017-08-06 DIAGNOSIS — I480 Paroxysmal atrial fibrillation: Secondary | ICD-10-CM | POA: Diagnosis not present

## 2017-08-06 DIAGNOSIS — I4891 Unspecified atrial fibrillation: Secondary | ICD-10-CM | POA: Diagnosis not present

## 2017-08-07 DIAGNOSIS — R9431 Abnormal electrocardiogram [ECG] [EKG]: Secondary | ICD-10-CM | POA: Diagnosis not present

## 2017-08-09 DIAGNOSIS — R319 Hematuria, unspecified: Secondary | ICD-10-CM | POA: Diagnosis not present

## 2017-08-09 DIAGNOSIS — N3001 Acute cystitis with hematuria: Secondary | ICD-10-CM | POA: Diagnosis not present

## 2017-08-09 DIAGNOSIS — I4891 Unspecified atrial fibrillation: Secondary | ICD-10-CM | POA: Diagnosis not present

## 2017-08-09 DIAGNOSIS — R300 Dysuria: Secondary | ICD-10-CM | POA: Diagnosis not present

## 2017-08-14 DIAGNOSIS — N3010 Interstitial cystitis (chronic) without hematuria: Secondary | ICD-10-CM | POA: Diagnosis not present

## 2017-08-14 DIAGNOSIS — R8290 Unspecified abnormal findings in urine: Secondary | ICD-10-CM | POA: Diagnosis not present

## 2017-08-19 ENCOUNTER — Other Ambulatory Visit (HOSPITAL_COMMUNITY): Payer: Self-pay | Admitting: *Deleted

## 2017-08-20 DIAGNOSIS — I10 Essential (primary) hypertension: Secondary | ICD-10-CM | POA: Diagnosis not present

## 2017-08-20 DIAGNOSIS — N3010 Interstitial cystitis (chronic) without hematuria: Secondary | ICD-10-CM | POA: Diagnosis not present

## 2017-08-20 DIAGNOSIS — I480 Paroxysmal atrial fibrillation: Secondary | ICD-10-CM | POA: Diagnosis not present

## 2017-08-20 DIAGNOSIS — K589 Irritable bowel syndrome without diarrhea: Secondary | ICD-10-CM | POA: Diagnosis not present

## 2017-08-25 DIAGNOSIS — N3289 Other specified disorders of bladder: Secondary | ICD-10-CM | POA: Diagnosis not present

## 2017-08-25 DIAGNOSIS — N3010 Interstitial cystitis (chronic) without hematuria: Secondary | ICD-10-CM | POA: Diagnosis not present

## 2017-08-25 DIAGNOSIS — N2889 Other specified disorders of kidney and ureter: Secondary | ICD-10-CM | POA: Diagnosis not present

## 2017-09-02 DIAGNOSIS — Z23 Encounter for immunization: Secondary | ICD-10-CM | POA: Diagnosis not present

## 2017-09-08 DIAGNOSIS — M791 Myalgia: Secondary | ICD-10-CM | POA: Diagnosis not present

## 2017-09-08 DIAGNOSIS — Z9189 Other specified personal risk factors, not elsewhere classified: Secondary | ICD-10-CM | POA: Diagnosis not present

## 2017-09-08 DIAGNOSIS — Z888 Allergy status to other drugs, medicaments and biological substances status: Secondary | ICD-10-CM | POA: Diagnosis not present

## 2017-09-08 DIAGNOSIS — K219 Gastro-esophageal reflux disease without esophagitis: Secondary | ICD-10-CM | POA: Diagnosis not present

## 2017-09-08 DIAGNOSIS — R310 Gross hematuria: Secondary | ICD-10-CM | POA: Diagnosis not present

## 2017-09-08 DIAGNOSIS — Z87891 Personal history of nicotine dependence: Secondary | ICD-10-CM | POA: Diagnosis not present

## 2017-09-08 DIAGNOSIS — Z79899 Other long term (current) drug therapy: Secondary | ICD-10-CM | POA: Diagnosis not present

## 2017-09-08 DIAGNOSIS — I4891 Unspecified atrial fibrillation: Secondary | ICD-10-CM | POA: Diagnosis not present

## 2017-09-08 DIAGNOSIS — Z7901 Long term (current) use of anticoagulants: Secondary | ICD-10-CM | POA: Diagnosis not present

## 2017-09-08 DIAGNOSIS — R3915 Urgency of urination: Secondary | ICD-10-CM | POA: Diagnosis not present

## 2017-09-08 DIAGNOSIS — N3010 Interstitial cystitis (chronic) without hematuria: Secondary | ICD-10-CM | POA: Diagnosis not present

## 2017-09-08 DIAGNOSIS — Z881 Allergy status to other antibiotic agents status: Secondary | ICD-10-CM | POA: Diagnosis not present

## 2017-09-08 DIAGNOSIS — N3011 Interstitial cystitis (chronic) with hematuria: Secondary | ICD-10-CM | POA: Diagnosis not present

## 2017-09-08 DIAGNOSIS — M4316 Spondylolisthesis, lumbar region: Secondary | ICD-10-CM | POA: Diagnosis not present

## 2017-09-08 DIAGNOSIS — R350 Frequency of micturition: Secondary | ICD-10-CM | POA: Diagnosis not present

## 2017-09-11 DIAGNOSIS — D649 Anemia, unspecified: Secondary | ICD-10-CM | POA: Diagnosis not present

## 2017-09-11 DIAGNOSIS — E876 Hypokalemia: Secondary | ICD-10-CM | POA: Diagnosis not present

## 2017-09-12 DIAGNOSIS — E876 Hypokalemia: Secondary | ICD-10-CM | POA: Diagnosis not present

## 2017-09-12 DIAGNOSIS — N3010 Interstitial cystitis (chronic) without hematuria: Secondary | ICD-10-CM | POA: Diagnosis not present

## 2017-09-12 DIAGNOSIS — N6459 Other signs and symptoms in breast: Secondary | ICD-10-CM | POA: Diagnosis not present

## 2017-09-22 DIAGNOSIS — N950 Postmenopausal bleeding: Secondary | ICD-10-CM | POA: Diagnosis not present

## 2017-10-10 DIAGNOSIS — R82998 Other abnormal findings in urine: Secondary | ICD-10-CM | POA: Diagnosis not present

## 2017-10-10 DIAGNOSIS — N3010 Interstitial cystitis (chronic) without hematuria: Secondary | ICD-10-CM | POA: Diagnosis not present

## 2017-10-18 ENCOUNTER — Other Ambulatory Visit (HOSPITAL_COMMUNITY): Payer: Self-pay | Admitting: Nurse Practitioner

## 2017-10-22 DIAGNOSIS — M4696 Unspecified inflammatory spondylopathy, lumbar region: Secondary | ICD-10-CM | POA: Diagnosis not present

## 2017-10-22 DIAGNOSIS — Z4789 Encounter for other orthopedic aftercare: Secondary | ICD-10-CM | POA: Diagnosis not present

## 2017-10-22 DIAGNOSIS — Z87891 Personal history of nicotine dependence: Secondary | ICD-10-CM | POA: Diagnosis not present

## 2017-10-22 DIAGNOSIS — M48062 Spinal stenosis, lumbar region with neurogenic claudication: Secondary | ICD-10-CM | POA: Diagnosis not present

## 2017-10-22 DIAGNOSIS — Z959 Presence of cardiac and vascular implant and graft, unspecified: Secondary | ICD-10-CM | POA: Diagnosis not present

## 2017-10-22 DIAGNOSIS — Z981 Arthrodesis status: Secondary | ICD-10-CM | POA: Diagnosis not present

## 2017-10-22 DIAGNOSIS — M4185 Other forms of scoliosis, thoracolumbar region: Secondary | ICD-10-CM | POA: Diagnosis not present

## 2017-10-22 DIAGNOSIS — M4316 Spondylolisthesis, lumbar region: Secondary | ICD-10-CM | POA: Diagnosis not present

## 2017-10-22 DIAGNOSIS — M419 Scoliosis, unspecified: Secondary | ICD-10-CM | POA: Diagnosis not present

## 2017-10-22 DIAGNOSIS — M47812 Spondylosis without myelopathy or radiculopathy, cervical region: Secondary | ICD-10-CM | POA: Diagnosis not present

## 2017-10-22 DIAGNOSIS — M47815 Spondylosis without myelopathy or radiculopathy, thoracolumbar region: Secondary | ICD-10-CM | POA: Diagnosis not present

## 2017-11-12 ENCOUNTER — Telehealth: Payer: Self-pay

## 2017-11-12 NOTE — Telephone Encounter (Signed)
I just received paperwork from [facility] [employer] questioning the medications that this patient is taking.  She apparently is taking Spironolactone 25 mg, Losartan Potassium 100 mg and Potassium Chloride 10 meq.  Please advise on this issue.  Thank you

## 2017-11-14 DIAGNOSIS — N3010 Interstitial cystitis (chronic) without hematuria: Secondary | ICD-10-CM | POA: Diagnosis not present

## 2017-11-14 NOTE — Telephone Encounter (Signed)
Will have Faxed to Dr. Shon Baton, patient's PCP.

## 2017-11-26 DIAGNOSIS — Z1231 Encounter for screening mammogram for malignant neoplasm of breast: Secondary | ICD-10-CM | POA: Diagnosis not present

## 2017-12-17 DIAGNOSIS — Z780 Asymptomatic menopausal state: Secondary | ICD-10-CM | POA: Diagnosis not present

## 2017-12-17 DIAGNOSIS — Z13820 Encounter for screening for osteoporosis: Secondary | ICD-10-CM | POA: Diagnosis not present

## 2017-12-18 DIAGNOSIS — E119 Type 2 diabetes mellitus without complications: Secondary | ICD-10-CM | POA: Diagnosis not present

## 2017-12-18 DIAGNOSIS — D649 Anemia, unspecified: Secondary | ICD-10-CM | POA: Diagnosis not present

## 2017-12-18 DIAGNOSIS — E039 Hypothyroidism, unspecified: Secondary | ICD-10-CM | POA: Diagnosis not present

## 2017-12-18 DIAGNOSIS — R5381 Other malaise: Secondary | ICD-10-CM | POA: Diagnosis not present

## 2017-12-18 DIAGNOSIS — R739 Hyperglycemia, unspecified: Secondary | ICD-10-CM | POA: Diagnosis not present

## 2017-12-24 DIAGNOSIS — E876 Hypokalemia: Secondary | ICD-10-CM | POA: Diagnosis not present

## 2017-12-24 DIAGNOSIS — K580 Irritable bowel syndrome with diarrhea: Secondary | ICD-10-CM | POA: Diagnosis not present

## 2017-12-24 DIAGNOSIS — M67432 Ganglion, left wrist: Secondary | ICD-10-CM | POA: Diagnosis not present

## 2017-12-24 DIAGNOSIS — I480 Paroxysmal atrial fibrillation: Secondary | ICD-10-CM | POA: Diagnosis not present

## 2017-12-31 DIAGNOSIS — M67432 Ganglion, left wrist: Secondary | ICD-10-CM | POA: Diagnosis not present

## 2017-12-31 DIAGNOSIS — Z888 Allergy status to other drugs, medicaments and biological substances status: Secondary | ICD-10-CM | POA: Diagnosis not present

## 2017-12-31 DIAGNOSIS — Z881 Allergy status to other antibiotic agents status: Secondary | ICD-10-CM | POA: Diagnosis not present

## 2018-01-08 DIAGNOSIS — D649 Anemia, unspecified: Secondary | ICD-10-CM | POA: Diagnosis not present

## 2018-01-08 DIAGNOSIS — R609 Edema, unspecified: Secondary | ICD-10-CM | POA: Diagnosis not present

## 2018-01-09 DIAGNOSIS — M810 Age-related osteoporosis without current pathological fracture: Secondary | ICD-10-CM | POA: Diagnosis not present

## 2018-01-09 DIAGNOSIS — Z789 Other specified health status: Secondary | ICD-10-CM | POA: Diagnosis not present

## 2018-01-09 DIAGNOSIS — E876 Hypokalemia: Secondary | ICD-10-CM | POA: Diagnosis not present

## 2018-01-22 DIAGNOSIS — Z01419 Encounter for gynecological examination (general) (routine) without abnormal findings: Secondary | ICD-10-CM | POA: Diagnosis not present

## 2018-01-28 DIAGNOSIS — E785 Hyperlipidemia, unspecified: Secondary | ICD-10-CM | POA: Diagnosis not present

## 2018-01-28 DIAGNOSIS — R609 Edema, unspecified: Secondary | ICD-10-CM | POA: Diagnosis not present

## 2018-01-28 DIAGNOSIS — E559 Vitamin D deficiency, unspecified: Secondary | ICD-10-CM | POA: Diagnosis not present

## 2018-01-28 DIAGNOSIS — D649 Anemia, unspecified: Secondary | ICD-10-CM | POA: Diagnosis not present

## 2018-01-29 DIAGNOSIS — Z981 Arthrodesis status: Secondary | ICD-10-CM | POA: Diagnosis not present

## 2018-01-29 DIAGNOSIS — I4891 Unspecified atrial fibrillation: Secondary | ICD-10-CM | POA: Diagnosis not present

## 2018-01-29 DIAGNOSIS — M810 Age-related osteoporosis without current pathological fracture: Secondary | ICD-10-CM | POA: Diagnosis not present

## 2018-01-29 DIAGNOSIS — M48061 Spinal stenosis, lumbar region without neurogenic claudication: Secondary | ICD-10-CM | POA: Diagnosis not present

## 2018-01-29 DIAGNOSIS — Z79899 Other long term (current) drug therapy: Secondary | ICD-10-CM | POA: Diagnosis not present

## 2018-01-29 DIAGNOSIS — K219 Gastro-esophageal reflux disease without esophagitis: Secondary | ICD-10-CM | POA: Diagnosis not present

## 2018-01-29 DIAGNOSIS — Z8262 Family history of osteoporosis: Secondary | ICD-10-CM | POA: Diagnosis not present

## 2018-02-02 ENCOUNTER — Other Ambulatory Visit: Payer: Self-pay | Admitting: Internal Medicine

## 2018-02-02 NOTE — Telephone Encounter (Signed)
Pt last saw Dr Rayann Heman 06/16/17, last labs 05/21/17 Creat 0.69, age 72, weight 77.6kg, based on specified criteria pt is on ap[propriate dosage of Eliquis 5mg  BID.  Will refill rx.

## 2018-02-04 DIAGNOSIS — R9431 Abnormal electrocardiogram [ECG] [EKG]: Secondary | ICD-10-CM | POA: Diagnosis not present

## 2018-02-04 DIAGNOSIS — Z9889 Other specified postprocedural states: Secondary | ICD-10-CM | POA: Diagnosis not present

## 2018-02-04 DIAGNOSIS — R002 Palpitations: Secondary | ICD-10-CM | POA: Diagnosis not present

## 2018-02-04 DIAGNOSIS — I4891 Unspecified atrial fibrillation: Secondary | ICD-10-CM | POA: Diagnosis not present

## 2018-02-04 DIAGNOSIS — I498 Other specified cardiac arrhythmias: Secondary | ICD-10-CM | POA: Diagnosis not present

## 2018-02-06 DIAGNOSIS — K591 Functional diarrhea: Secondary | ICD-10-CM | POA: Diagnosis not present

## 2018-02-06 DIAGNOSIS — K219 Gastro-esophageal reflux disease without esophagitis: Secondary | ICD-10-CM | POA: Diagnosis not present

## 2018-02-06 DIAGNOSIS — Z862 Personal history of diseases of the blood and blood-forming organs and certain disorders involving the immune mechanism: Secondary | ICD-10-CM | POA: Diagnosis not present

## 2018-02-06 DIAGNOSIS — K648 Other hemorrhoids: Secondary | ICD-10-CM | POA: Diagnosis not present

## 2018-02-16 DIAGNOSIS — N3010 Interstitial cystitis (chronic) without hematuria: Secondary | ICD-10-CM | POA: Diagnosis not present

## 2018-03-05 DIAGNOSIS — I4891 Unspecified atrial fibrillation: Secondary | ICD-10-CM | POA: Diagnosis not present

## 2018-03-05 DIAGNOSIS — M810 Age-related osteoporosis without current pathological fracture: Secondary | ICD-10-CM | POA: Diagnosis not present

## 2018-03-05 DIAGNOSIS — Z8262 Family history of osteoporosis: Secondary | ICD-10-CM | POA: Diagnosis not present

## 2018-03-05 DIAGNOSIS — Z981 Arthrodesis status: Secondary | ICD-10-CM | POA: Diagnosis not present

## 2018-03-05 DIAGNOSIS — K219 Gastro-esophageal reflux disease without esophagitis: Secondary | ICD-10-CM | POA: Diagnosis not present

## 2018-03-05 DIAGNOSIS — M4800 Spinal stenosis, site unspecified: Secondary | ICD-10-CM | POA: Diagnosis not present

## 2018-03-17 DIAGNOSIS — M5414 Radiculopathy, thoracic region: Secondary | ICD-10-CM | POA: Diagnosis not present

## 2018-03-27 DIAGNOSIS — M810 Age-related osteoporosis without current pathological fracture: Secondary | ICD-10-CM | POA: Diagnosis not present

## 2018-03-31 DIAGNOSIS — K219 Gastro-esophageal reflux disease without esophagitis: Secondary | ICD-10-CM | POA: Diagnosis not present

## 2018-03-31 DIAGNOSIS — K591 Functional diarrhea: Secondary | ICD-10-CM | POA: Diagnosis not present

## 2018-04-08 DIAGNOSIS — K580 Irritable bowel syndrome with diarrhea: Secondary | ICD-10-CM | POA: Diagnosis not present

## 2018-04-08 DIAGNOSIS — I480 Paroxysmal atrial fibrillation: Secondary | ICD-10-CM | POA: Diagnosis not present

## 2018-04-08 DIAGNOSIS — M5414 Radiculopathy, thoracic region: Secondary | ICD-10-CM | POA: Diagnosis not present

## 2018-04-08 DIAGNOSIS — E876 Hypokalemia: Secondary | ICD-10-CM | POA: Diagnosis not present

## 2018-04-14 DIAGNOSIS — H31102 Choroidal degeneration, unspecified, left eye: Secondary | ICD-10-CM | POA: Diagnosis not present

## 2018-04-14 DIAGNOSIS — I10 Essential (primary) hypertension: Secondary | ICD-10-CM | POA: Diagnosis not present

## 2018-04-14 DIAGNOSIS — H26492 Other secondary cataract, left eye: Secondary | ICD-10-CM | POA: Diagnosis not present

## 2018-04-14 DIAGNOSIS — D649 Anemia, unspecified: Secondary | ICD-10-CM | POA: Diagnosis not present

## 2018-04-14 DIAGNOSIS — H04123 Dry eye syndrome of bilateral lacrimal glands: Secondary | ICD-10-CM | POA: Diagnosis not present

## 2018-04-14 DIAGNOSIS — H353131 Nonexudative age-related macular degeneration, bilateral, early dry stage: Secondary | ICD-10-CM | POA: Diagnosis not present

## 2018-04-15 DIAGNOSIS — R918 Other nonspecific abnormal finding of lung field: Secondary | ICD-10-CM | POA: Diagnosis not present

## 2018-04-22 DIAGNOSIS — R1013 Epigastric pain: Secondary | ICD-10-CM | POA: Diagnosis not present

## 2018-04-22 DIAGNOSIS — K591 Functional diarrhea: Secondary | ICD-10-CM | POA: Diagnosis not present

## 2018-04-22 DIAGNOSIS — R9389 Abnormal findings on diagnostic imaging of other specified body structures: Secondary | ICD-10-CM | POA: Diagnosis not present

## 2018-04-22 DIAGNOSIS — K219 Gastro-esophageal reflux disease without esophagitis: Secondary | ICD-10-CM | POA: Diagnosis not present

## 2018-04-24 DIAGNOSIS — K317 Polyp of stomach and duodenum: Secondary | ICD-10-CM | POA: Diagnosis not present

## 2018-04-24 DIAGNOSIS — K210 Gastro-esophageal reflux disease with esophagitis: Secondary | ICD-10-CM | POA: Diagnosis not present

## 2018-04-24 DIAGNOSIS — K228 Other specified diseases of esophagus: Secondary | ICD-10-CM | POA: Diagnosis not present

## 2018-04-24 DIAGNOSIS — K209 Esophagitis, unspecified: Secondary | ICD-10-CM | POA: Diagnosis not present

## 2018-04-24 DIAGNOSIS — R933 Abnormal findings on diagnostic imaging of other parts of digestive tract: Secondary | ICD-10-CM | POA: Diagnosis not present

## 2018-04-24 DIAGNOSIS — K229 Disease of esophagus, unspecified: Secondary | ICD-10-CM | POA: Diagnosis not present

## 2018-04-30 DIAGNOSIS — K210 Gastro-esophageal reflux disease with esophagitis: Secondary | ICD-10-CM | POA: Diagnosis not present

## 2018-04-30 DIAGNOSIS — K317 Polyp of stomach and duodenum: Secondary | ICD-10-CM | POA: Diagnosis not present

## 2018-05-04 DIAGNOSIS — I4891 Unspecified atrial fibrillation: Secondary | ICD-10-CM | POA: Diagnosis not present

## 2018-05-04 DIAGNOSIS — Z4509 Encounter for adjustment and management of other cardiac device: Secondary | ICD-10-CM | POA: Diagnosis not present

## 2018-05-06 DIAGNOSIS — D649 Anemia, unspecified: Secondary | ICD-10-CM | POA: Diagnosis not present

## 2018-05-06 DIAGNOSIS — E785 Hyperlipidemia, unspecified: Secondary | ICD-10-CM | POA: Diagnosis not present

## 2018-05-07 DIAGNOSIS — G4709 Other insomnia: Secondary | ICD-10-CM | POA: Diagnosis not present

## 2018-05-07 DIAGNOSIS — M25511 Pain in right shoulder: Secondary | ICD-10-CM | POA: Diagnosis not present

## 2018-05-07 DIAGNOSIS — K228 Other specified diseases of esophagus: Secondary | ICD-10-CM | POA: Diagnosis not present

## 2018-05-07 DIAGNOSIS — R918 Other nonspecific abnormal finding of lung field: Secondary | ICD-10-CM | POA: Diagnosis not present

## 2018-05-09 DIAGNOSIS — Z4509 Encounter for adjustment and management of other cardiac device: Secondary | ICD-10-CM | POA: Diagnosis not present

## 2018-05-09 DIAGNOSIS — I4891 Unspecified atrial fibrillation: Secondary | ICD-10-CM | POA: Diagnosis not present

## 2018-05-18 DIAGNOSIS — N950 Postmenopausal bleeding: Secondary | ICD-10-CM | POA: Diagnosis not present

## 2018-05-26 DIAGNOSIS — M25551 Pain in right hip: Secondary | ICD-10-CM | POA: Diagnosis not present

## 2018-06-22 DIAGNOSIS — M79642 Pain in left hand: Secondary | ICD-10-CM | POA: Diagnosis not present

## 2018-06-23 DIAGNOSIS — M79642 Pain in left hand: Secondary | ICD-10-CM | POA: Diagnosis not present

## 2018-06-29 DIAGNOSIS — L729 Follicular cyst of the skin and subcutaneous tissue, unspecified: Secondary | ICD-10-CM | POA: Diagnosis not present

## 2018-06-29 DIAGNOSIS — S62357A Nondisplaced fracture of shaft of fifth metacarpal bone, left hand, initial encounter for closed fracture: Secondary | ICD-10-CM | POA: Diagnosis not present

## 2018-06-29 DIAGNOSIS — S62356D Nondisplaced fracture of shaft of fifth metacarpal bone, right hand, subsequent encounter for fracture with routine healing: Secondary | ICD-10-CM | POA: Diagnosis not present

## 2018-07-03 DIAGNOSIS — H26492 Other secondary cataract, left eye: Secondary | ICD-10-CM | POA: Diagnosis not present

## 2018-07-13 DIAGNOSIS — D239 Other benign neoplasm of skin, unspecified: Secondary | ICD-10-CM | POA: Diagnosis not present

## 2018-07-13 DIAGNOSIS — L738 Other specified follicular disorders: Secondary | ICD-10-CM | POA: Diagnosis not present

## 2018-07-13 DIAGNOSIS — D2339 Other benign neoplasm of skin of other parts of face: Secondary | ICD-10-CM | POA: Diagnosis not present

## 2018-07-13 DIAGNOSIS — L821 Other seborrheic keratosis: Secondary | ICD-10-CM | POA: Diagnosis not present

## 2018-07-25 ENCOUNTER — Other Ambulatory Visit: Payer: Self-pay | Admitting: Internal Medicine

## 2018-07-27 DIAGNOSIS — R918 Other nonspecific abnormal finding of lung field: Secondary | ICD-10-CM | POA: Diagnosis not present

## 2018-08-04 DIAGNOSIS — Z4509 Encounter for adjustment and management of other cardiac device: Secondary | ICD-10-CM | POA: Diagnosis not present

## 2018-08-04 DIAGNOSIS — I4891 Unspecified atrial fibrillation: Secondary | ICD-10-CM | POA: Diagnosis not present

## 2018-08-07 DIAGNOSIS — Z79899 Other long term (current) drug therapy: Secondary | ICD-10-CM | POA: Diagnosis not present

## 2018-08-07 DIAGNOSIS — Z7901 Long term (current) use of anticoagulants: Secondary | ICD-10-CM | POA: Diagnosis not present

## 2018-08-07 DIAGNOSIS — Z881 Allergy status to other antibiotic agents status: Secondary | ICD-10-CM | POA: Diagnosis not present

## 2018-08-07 DIAGNOSIS — I480 Paroxysmal atrial fibrillation: Secondary | ICD-10-CM | POA: Diagnosis not present

## 2018-08-07 DIAGNOSIS — Z95818 Presence of other cardiac implants and grafts: Secondary | ICD-10-CM | POA: Diagnosis not present

## 2018-08-07 DIAGNOSIS — Z888 Allergy status to other drugs, medicaments and biological substances status: Secondary | ICD-10-CM | POA: Diagnosis not present

## 2018-08-10 DIAGNOSIS — R9431 Abnormal electrocardiogram [ECG] [EKG]: Secondary | ICD-10-CM | POA: Diagnosis not present

## 2018-08-24 IMAGING — DX DG CHEST 2V
2 series · 2 of 2 positions shown · non-contrast
Comparison: None.

CLINICAL DATA: afib and anemic for few days,,,gets weak when
standing

EXAM:
CHEST  2 VIEW

[w chest pa]
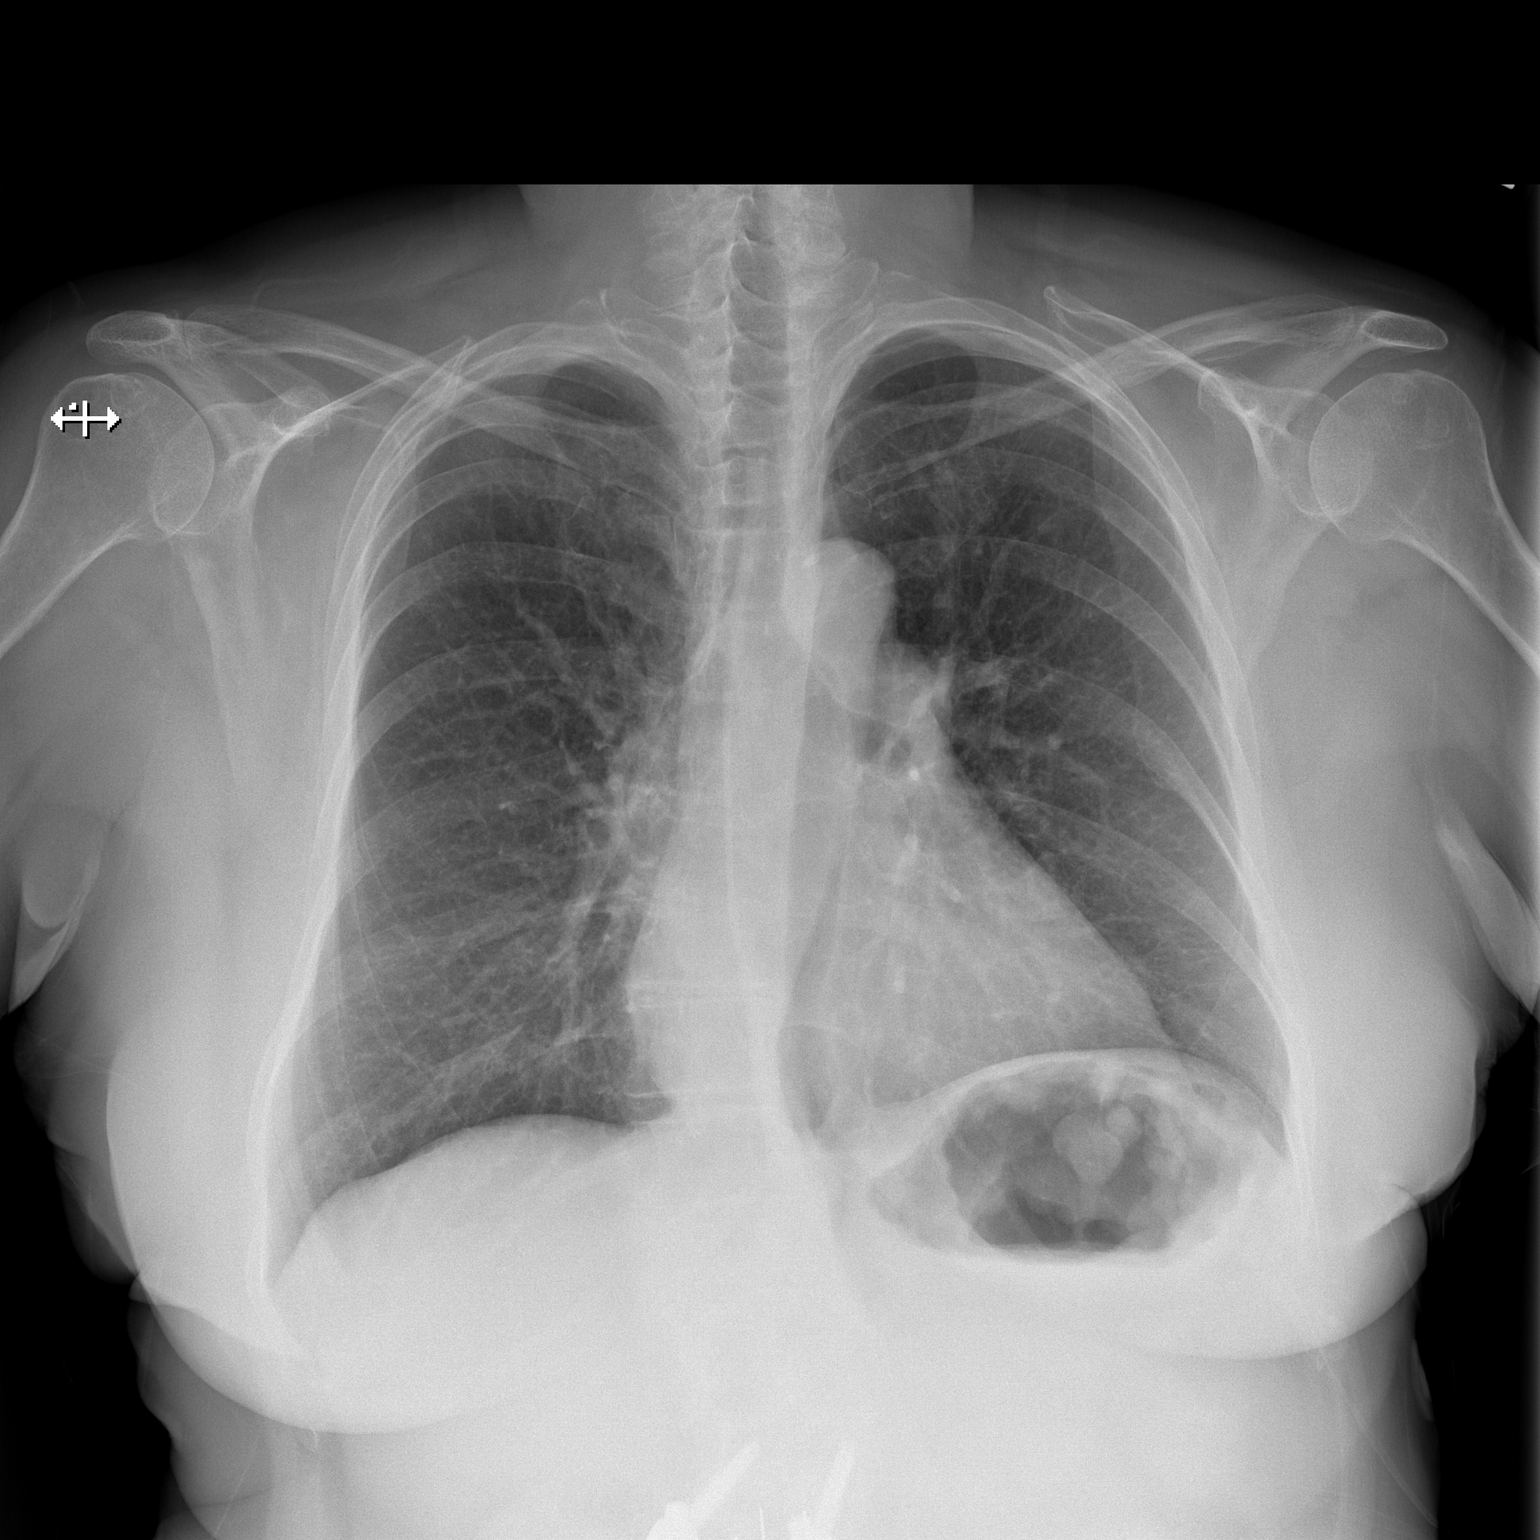

[w chest lat]
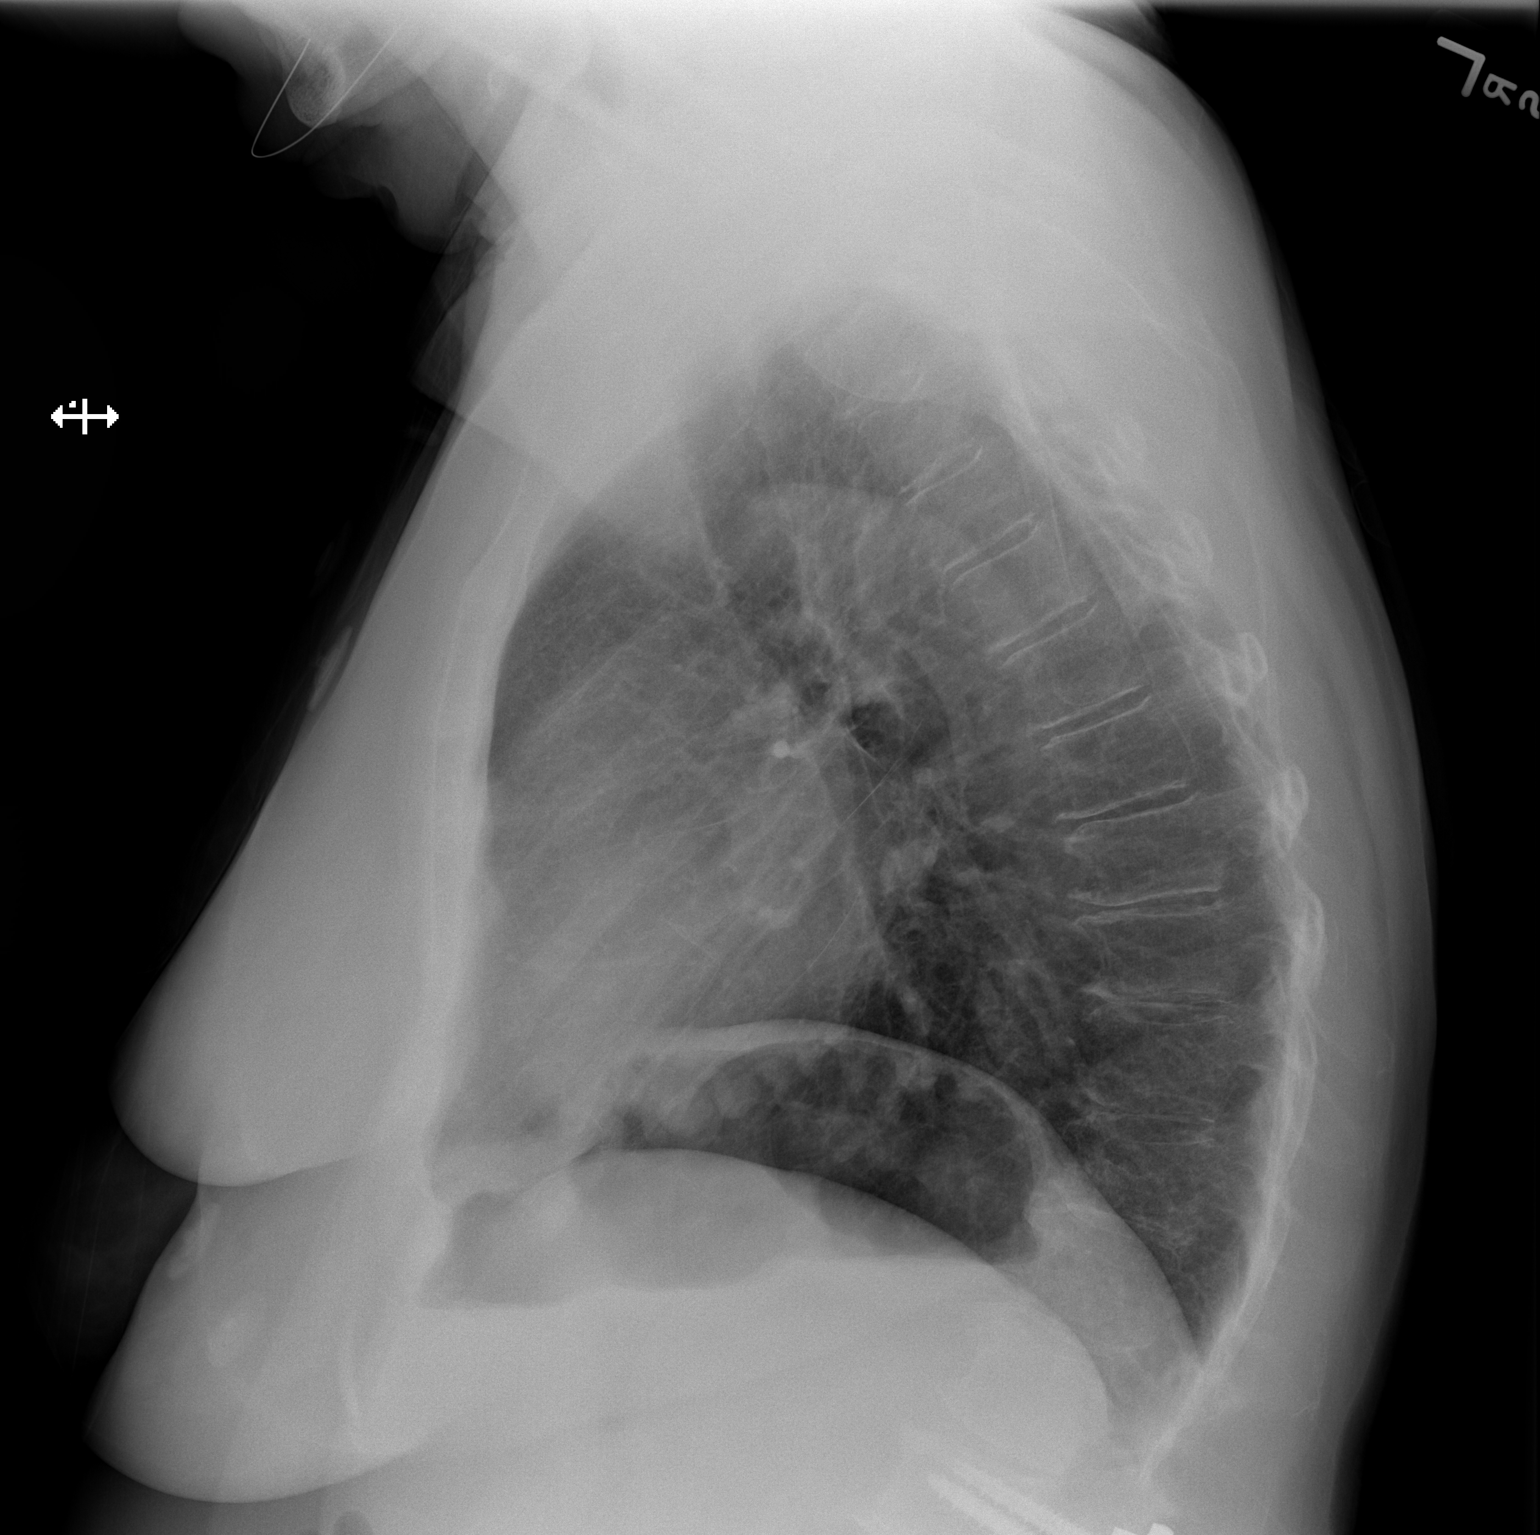

[2 of 2 positions shown; findings below may reference images not displayed]

FINDINGS: The heart size and mediastinal contours are within normal limits.
Both lungs are clear. The visualized skeletal structures are
unremarkable. Prominent rugal folds within the stomach. Cannot
exclude followup point lesions.
IMPRESSION: 1.  No acute cardiopulmonary process.
2. Prominent rugal folds.  Cannot exclude polyps within stomach.

## 2018-08-24 IMAGING — CT CT ANGIO CHEST
1 of 8 series · 17 of 36 positions shown · IV contrast (isovue)
Comparison: PA and lateral chest earlier today. Single-view of the
chest 02/15/2011.

CLINICAL DATA: Chest tightness for weeks which worsened last night.

EXAM:
CT ANGIOGRAPHY CHEST WITH CONTRAST
TECHNIQUE: Multidetector CT imaging of the chest was performed using the
standard protocol during bolus administration of intravenous
contrast. Multiplanar CT image reconstructions and MIPs were
obtained to evaluate the vascular anatomy.
CONTRAST:  100 cc Isovue 370.

[Series 406: thins pacs · axial · 0.66mm/px · z∈[-295,-23]mm · 17 of 307 slices shown]
[im 18/307  lung]
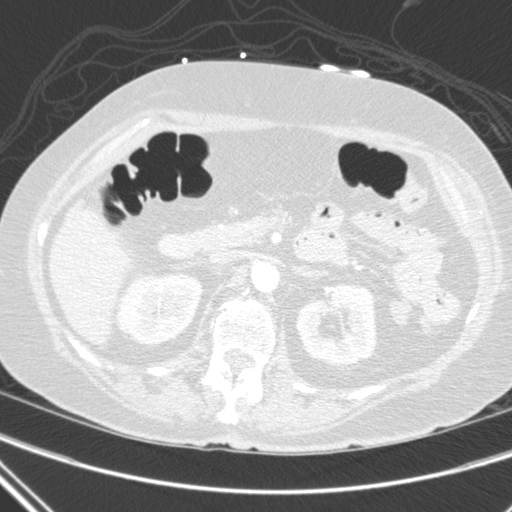
[im 35/307  mediastinal]
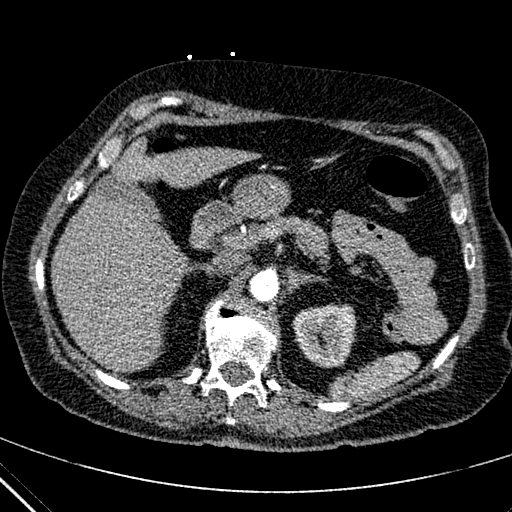
[im 52/307  lung]
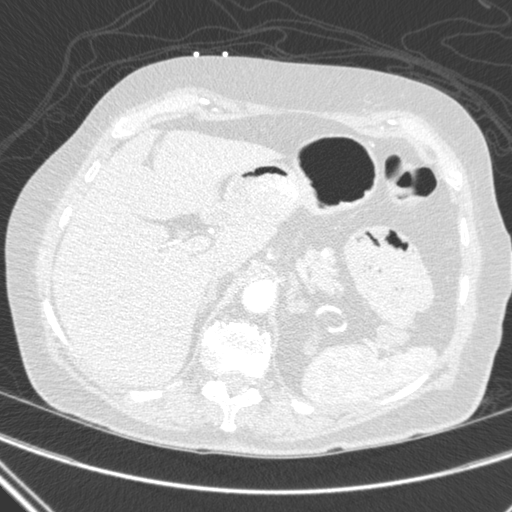
[im 69/307  mediastinal]
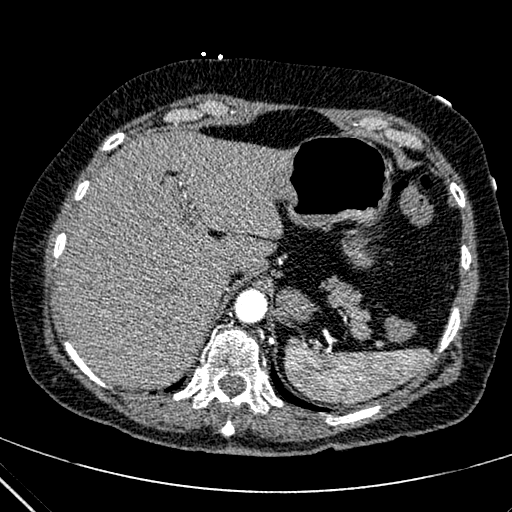
[im 86/307  lung]
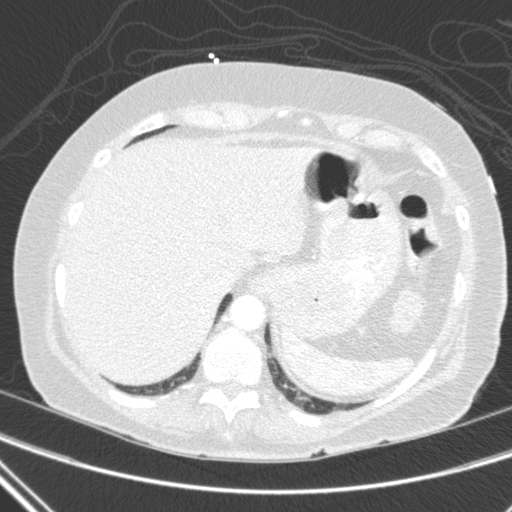
[im 103/307  mediastinal]
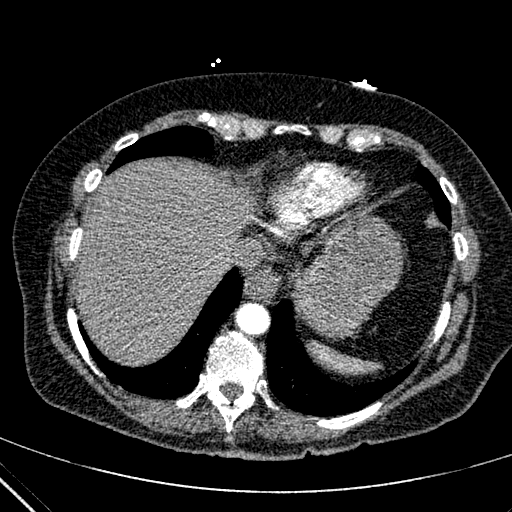
[im 120/307  lung]
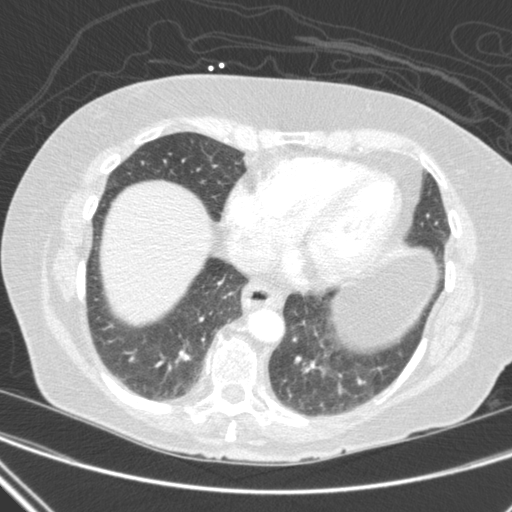
[im 137/307  mediastinal]
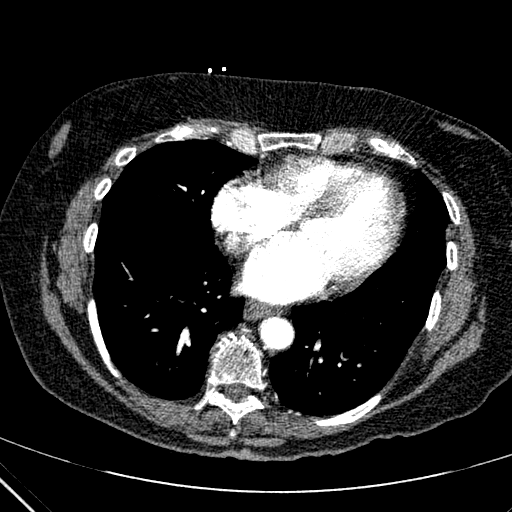
[im 154/307  lung]
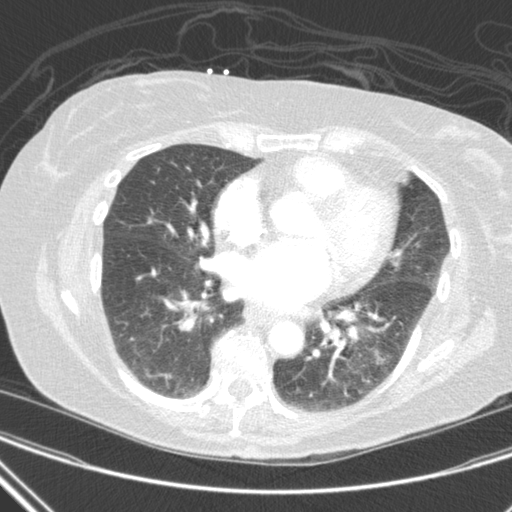
[im 171/307  mediastinal]
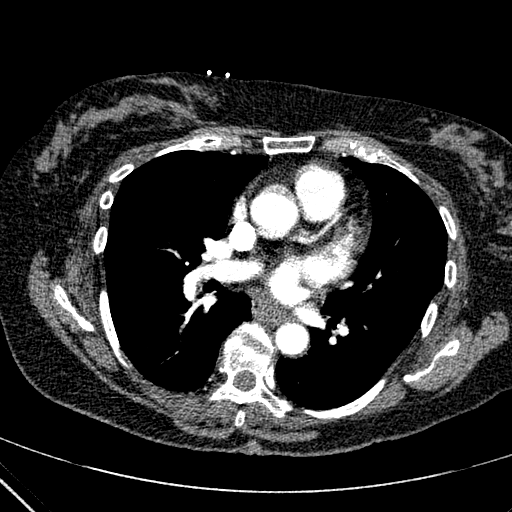
[im 188/307  lung]
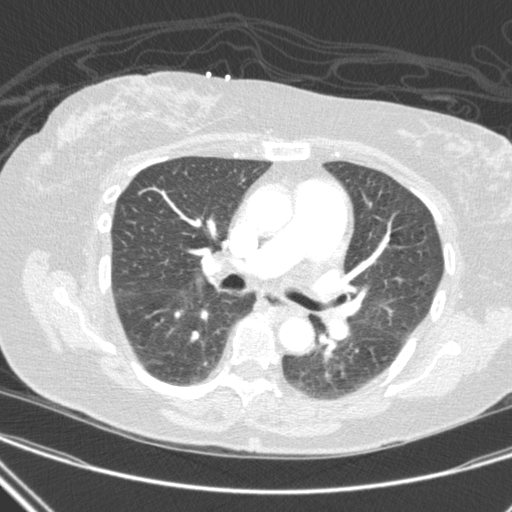
[im 205/307  mediastinal]
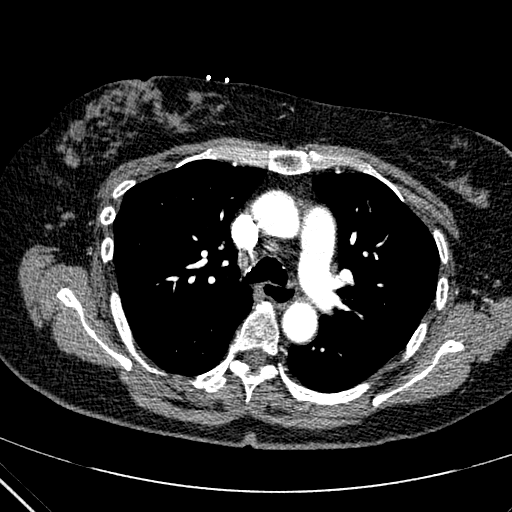
[im 222/307  lung]
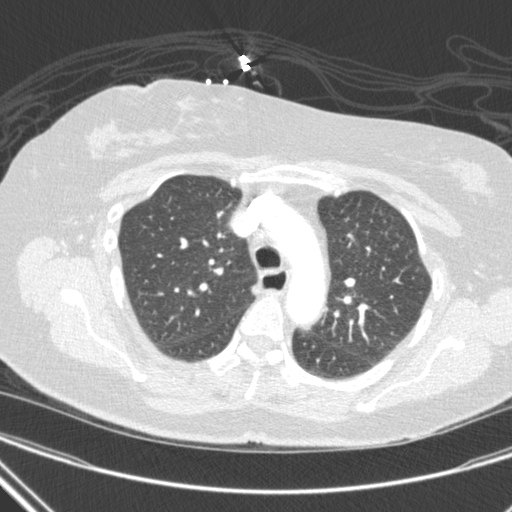
[im 239/307  mediastinal]
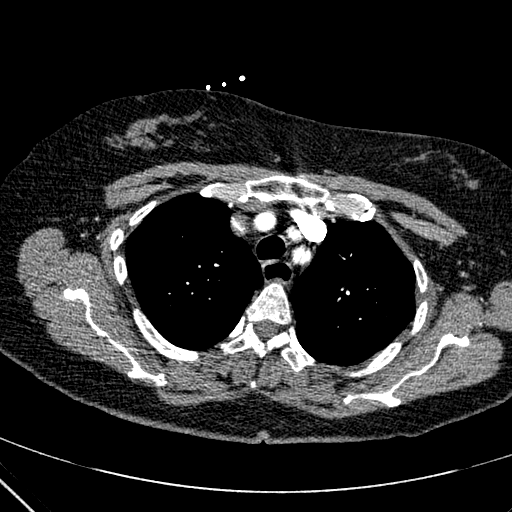
[im 256/307  lung]
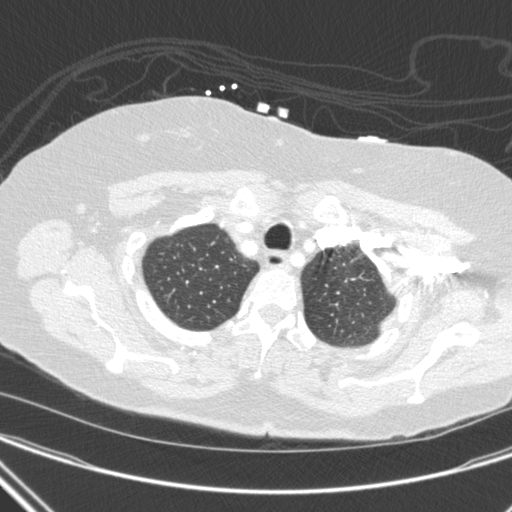
[im 273/307  mediastinal]
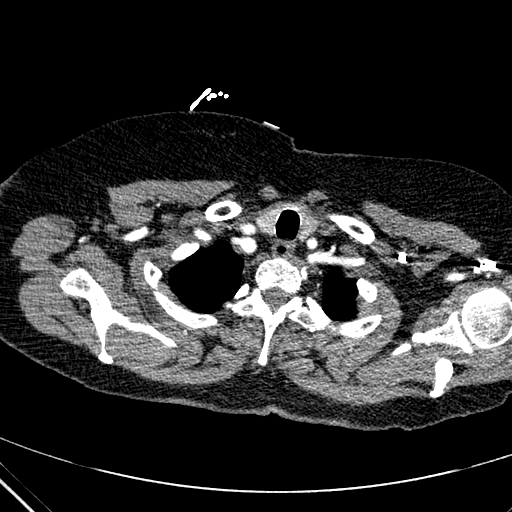
[im 290/307  lung]
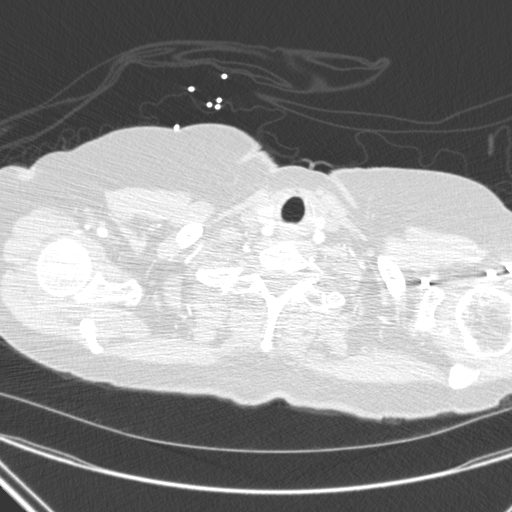

[17 of 36 positions shown; findings below may reference images not displayed]

FINDINGS: Cardiovascular: No pulmonary embolus is identified. Heart size is
normal. No calcific coronary artery disease is identified. A few
calcific atherosclerotic calcifications of the aorta are seen.
Bovine type aortic arch is incidentally noted.

Mediastinum/Nodes: Small hiatal hernia is seen. No lymphadenopathy.
Thyroid gland appears normal.

Lungs/Pleura: No pleural effusion.  Lungs are clear.

Upper Abdomen: Negative.

Musculoskeletal: No acute bony abnormality.

Review of the MIP images confirms the above findings.
IMPRESSION: Negative for pulmonary embolus.  No acute disease.

Mild aortic atherosclerosis.

Small hiatal hernia.

## 2018-08-26 DIAGNOSIS — N939 Abnormal uterine and vaginal bleeding, unspecified: Secondary | ICD-10-CM | POA: Diagnosis not present

## 2018-08-26 DIAGNOSIS — Z7901 Long term (current) use of anticoagulants: Secondary | ICD-10-CM | POA: Diagnosis not present

## 2018-08-26 DIAGNOSIS — N3010 Interstitial cystitis (chronic) without hematuria: Secondary | ICD-10-CM | POA: Diagnosis not present

## 2018-08-26 DIAGNOSIS — R82998 Other abnormal findings in urine: Secondary | ICD-10-CM | POA: Diagnosis not present

## 2018-09-18 DIAGNOSIS — Z23 Encounter for immunization: Secondary | ICD-10-CM | POA: Diagnosis not present

## 2018-09-28 DIAGNOSIS — M810 Age-related osteoporosis without current pathological fracture: Secondary | ICD-10-CM | POA: Diagnosis not present

## 2018-10-19 ENCOUNTER — Other Ambulatory Visit: Payer: Self-pay | Admitting: Internal Medicine

## 2018-11-03 DIAGNOSIS — M25551 Pain in right hip: Secondary | ICD-10-CM | POA: Diagnosis not present

## 2018-11-06 DIAGNOSIS — Z4509 Encounter for adjustment and management of other cardiac device: Secondary | ICD-10-CM | POA: Diagnosis not present

## 2018-11-06 DIAGNOSIS — I4891 Unspecified atrial fibrillation: Secondary | ICD-10-CM | POA: Diagnosis not present
